# Patient Record
Sex: Female | Born: 1960 | Race: White | Hispanic: No | Smoking: Former smoker
Health system: Southern US, Community
[De-identification: ages and names within clinical notes are randomized; demographics above are authoritative.]

## PROBLEM LIST (undated history)

## (undated) DIAGNOSIS — K219 Gastro-esophageal reflux disease without esophagitis: Secondary | ICD-10-CM

## (undated) DIAGNOSIS — I10 Essential (primary) hypertension: Secondary | ICD-10-CM

## (undated) DIAGNOSIS — R112 Nausea with vomiting, unspecified: Secondary | ICD-10-CM

## (undated) DIAGNOSIS — Z9889 Other specified postprocedural states: Secondary | ICD-10-CM

## (undated) DIAGNOSIS — F419 Anxiety disorder, unspecified: Secondary | ICD-10-CM

## (undated) DIAGNOSIS — E039 Hypothyroidism, unspecified: Secondary | ICD-10-CM

## (undated) DIAGNOSIS — E785 Hyperlipidemia, unspecified: Secondary | ICD-10-CM

## (undated) HISTORY — DX: Anxiety disorder, unspecified: F41.9

## (undated) HISTORY — DX: Hyperlipidemia, unspecified: E78.5

## (undated) HISTORY — DX: Gastro-esophageal reflux disease without esophagitis: K21.9

## (undated) HISTORY — PX: COLONOSCOPY: SHX174

## (undated) HISTORY — PX: WISDOM TOOTH EXTRACTION: SHX21

## (undated) HISTORY — DX: Hypothyroidism, unspecified: E03.9

---

## 1999-04-18 ENCOUNTER — Other Ambulatory Visit: Admission: RE | Admit: 1999-04-18 | Discharge: 1999-04-18 | Payer: Self-pay | Admitting: Obstetrics and Gynecology

## 1999-11-01 ENCOUNTER — Encounter (INDEPENDENT_AMBULATORY_CARE_PROVIDER_SITE_OTHER): Payer: Self-pay

## 1999-11-01 ENCOUNTER — Inpatient Hospital Stay (HOSPITAL_COMMUNITY): Admission: AD | Admit: 1999-11-01 | Discharge: 1999-11-04 | Payer: Self-pay | Admitting: Obstetrics and Gynecology

## 1999-11-08 ENCOUNTER — Encounter: Admission: RE | Admit: 1999-11-08 | Discharge: 2000-02-06 | Payer: Self-pay | Admitting: Obstetrics and Gynecology

## 1999-12-07 ENCOUNTER — Other Ambulatory Visit: Admission: RE | Admit: 1999-12-07 | Discharge: 1999-12-07 | Payer: Self-pay | Admitting: Obstetrics and Gynecology

## 2001-04-11 ENCOUNTER — Other Ambulatory Visit: Admission: RE | Admit: 2001-04-11 | Discharge: 2001-04-11 | Payer: Self-pay | Admitting: Obstetrics and Gynecology

## 2002-03-04 ENCOUNTER — Inpatient Hospital Stay (HOSPITAL_COMMUNITY): Admission: AD | Admit: 2002-03-04 | Discharge: 2002-03-04 | Payer: Self-pay | Admitting: Obstetrics and Gynecology

## 2002-03-18 ENCOUNTER — Inpatient Hospital Stay (HOSPITAL_COMMUNITY): Admission: AD | Admit: 2002-03-18 | Discharge: 2002-03-21 | Payer: Self-pay | Admitting: *Deleted

## 2002-03-18 ENCOUNTER — Inpatient Hospital Stay (HOSPITAL_COMMUNITY): Admission: AD | Admit: 2002-03-18 | Discharge: 2002-03-18 | Payer: Self-pay | Admitting: Obstetrics & Gynecology

## 2002-03-18 ENCOUNTER — Encounter: Payer: Self-pay | Admitting: Obstetrics & Gynecology

## 2002-05-01 ENCOUNTER — Other Ambulatory Visit: Admission: RE | Admit: 2002-05-01 | Discharge: 2002-05-01 | Payer: Self-pay | Admitting: Obstetrics and Gynecology

## 2003-04-02 ENCOUNTER — Ambulatory Visit (HOSPITAL_COMMUNITY): Admission: RE | Admit: 2003-04-02 | Discharge: 2003-04-02 | Payer: Self-pay | Admitting: Gastroenterology

## 2003-04-20 ENCOUNTER — Encounter: Admission: RE | Admit: 2003-04-20 | Discharge: 2003-04-20 | Payer: Self-pay | Admitting: Gastroenterology

## 2003-04-20 ENCOUNTER — Encounter: Payer: Self-pay | Admitting: Gastroenterology

## 2003-05-13 ENCOUNTER — Other Ambulatory Visit: Admission: RE | Admit: 2003-05-13 | Discharge: 2003-05-13 | Payer: Self-pay | Admitting: Obstetrics and Gynecology

## 2005-11-15 ENCOUNTER — Ambulatory Visit (HOSPITAL_COMMUNITY): Admission: RE | Admit: 2005-11-15 | Discharge: 2005-11-15 | Payer: Self-pay | Admitting: Obstetrics and Gynecology

## 2007-05-05 ENCOUNTER — Emergency Department (HOSPITAL_COMMUNITY): Admission: EM | Admit: 2007-05-05 | Discharge: 2007-05-05 | Payer: Self-pay | Admitting: Emergency Medicine

## 2010-12-22 NOTE — Op Note (Signed)
[facility]  Patient:    Christine [name], Christine [name]                         MRN: [id number] Proc. Date: 11/01/99 Adm. Date:  [id number] Attending:  Lenoard Aden                           Operative Report  PREOPERATIVE DIAGNOSIS:       Persistent placenta previa.  POSTOPERATIVE DIAGNOSIS:      Persistent placenta previa.  OPERATION:                    Primary low transverse cesarean section.  SURGEON:                      Lenoard Aden, M.D.  ASSISTANT:                    Sung Amabile. Roslyn Smiling, M.D.  ANESTHESIA:                   Spinal by Raul Del, M.D.  ESTIMATED BLOOD LOSS:         1000 cc.  COMPLICATIONS:                None.  DRAINS:                       Foley.  COUNTS:                       Correct.  CONDITION:                    The patient to the recovery room in good condition.  DESCRIPTION OF PROCEDURE:     After being apprised of the risks of anesthesia, infection, bleeding, injury to abdominal organs with need for repair, possible eed for blood transfusion, inherent risks of HIV and hepatitis, the patient was brought to the operating room after consents were signed.  Prepped and draped in the usual sterile fashion.  After achieving adequate spinal anesthesia and being prepped nd draped in the usual sterile fashion the Foley catheter was placed.  A Pfannenstiel skin incision was made with the scalpel and carried down to the fascia which was nicked in the midline and opened transversely using Mayo scissors.  The rectus muscles were dissected sharply in the midline.  The peritoneum entered sharply.  Bladder blade placed.  The visceroperitoneum scored in a smile-like fashion. Uterine incision was made using a scalpel for bloody, but clear amniotic fluid or delivery from vertex position of a fullterm living female, Apgars 8 and 9. Pediatricians in attendance.  Placenta palpably is a wraparound anterior posterior placenta previa  which is removed without difficulty.  The uterus is exteriorized. Normal tubes and ovaries are noted.  Intrauterine cavity has some evidence of bleeding from the lower implantation site along the left back part of the uterus. These are sutured using interrupted mattress sutures of 0 chromic suture without difficulty.  Good hemostasis is achieved.  Uterine incision closed using 0 Monocryl in continuous running fashion.  Second imbricating layer placed.  Rectus muscles reapproximated in the midline using a 0 chromic suture.  The fascia closed using 0 Vicryl in continuous running fashion.  The skin closed using staples.  Dilute Marcaine solution placed.  The patient  tolerates the procedure well and was transferred to the recovery room in good condition. DD:  11/01/99 TD:  11/01/99 Job: [id number]

## 2010-12-22 NOTE — Discharge Summary (Signed)
[facility]  Patient:    Christine [name], Christine [name]                         MRN: [id number] Adm. Date:  [id number] Disc. Date: [id number] Attending:  Lenoard Aden                           Discharge Summary  HOSPITAL COURSE:              Patient underwent uncomplicated C-section for a posterior placenta previa at 38 weeks on November 01, 1999, uncomplicated C-section performed.  Tolerated regular diet well postop day #1.  Hemoglobin 9.1. Discharged to home day #3.  DISCHARGE MEDICATIONS:        Tylox, #20, prenatal vitamins and iron given.  FOLLOWUP:                     Follow up in the office in four to six weeks. DD:  11/16/99 TD:  11/17/99 Job: [id number]

## 2010-12-22 NOTE — Op Note (Signed)
NAME:  Christine [name], Christine [name]                            ACCOUNT NO.:  [id number]   MEDICAL RECORD NO.:  [id number]                   PATIENT TYPE:  AMB   LOCATION:  ENDO                                 FACILITY:  [facility]   PHYSICIAN:  Anselmo Rod, M.D.               DATE OF BIRTH:  1960/12/17   DATE OF PROCEDURE:  04/02/2003  DATE OF DISCHARGE:                                 OPERATIVE REPORT   PROCEDURE PERFORMED:  Screening colonoscopy.   ENDOSCOPIST:  Anselmo Rod, M.D.   INSTRUMENT USED:  Olympus video colonoscope.   INDICATION FOR PROCEDURE:  A 50 year old white female with a history of  change in bowel habits, right lower quadrant pain, and a sister with high-  grade dysplasia, impression adenocarcinoma on a rectal mass.  Her sister is  60 years of age.  Rule out colonic polyps.   PREPROCEDURE PREPARATION:  Informed consent was procured from the patient.  The patient was fasted for eight hours prior to the procedure and prepped  with a bottle of magnesium citrate and a gallon of GoLYTELY the night prior  to the procedure.  The patient was also advised to stop breast-feeding for  24 hours before and after the procedure.   PREPROCEDURE PHYSICAL:  VITAL SIGNS:  The patient had stable vital signs.  NECK:  Supple.  CHEST:  Clear to auscultation.  S1, S2 regular.  ABDOMEN:  Soft with normal bowel sounds.   DESCRIPTION OF PROCEDURE:  The patient was placed in the left lateral  decubitus position and sedated with 100 mg of Demerol and 10 mg of Versed  intravenously.  Once the patient was adequately sedate and maintained on low-  flow oxygen and continuous cardiac monitoring, the Olympus video colonoscope  was advanced from the rectum to the cecum with slight difficulty.  The  patient had a tortuous colon.  No masses, polyps, erosions, or ulcerations  or diverticula were seen.  The appendiceal orifice and the ileocecal valve  were clearly visualized and photographed.  The terminal  ileum appeared  normal.  Small internal hemorrhoids were seen on retroflexion in the rectum.   IMPRESSION:  Normal colonoscopy up to the terminal ileum except for small  internal hemorrhoids.                RECOMMENDATIONS:  1. Repeat CRC screening is recommended in the next five years unless the     patient develops any abnormal symptoms in the interim.  2. A high-fiber diet with liberal fluid intake has been advocated.  3. Outpatient follow-up in the next two weeks or earlier if need be.  4. Resume lactation after 24 hours.  Anselmo Rod, M.D.    JNM/MEDQ  D:  04/02/2003  T:  04/03/2003  Job:  [id number]   cc:   Maxie Better, M.D.  [address]  [address]  [place]  [place] [address]  Fax: [phone]

## 2010-12-22 NOTE — H&P (Signed)
NAME:  Christine [name], Christine [name]                            ACCOUNT NO.:  [id number]   MEDICAL RECORD NO.:  [id number]                   PATIENT TYPE:  INP   LOCATION:  9172                                 FACILITY:  [facility]   PHYSICIAN:  Lenoard Aden, M.D.             DATE OF BIRTH:  11/19/1960   DATE OF ADMISSION:  03/18/2002  DATE OF DISCHARGE:                                HISTORY & PHYSICAL   CHIEF COMPLAINT:  Labor.   HISTORY OF PRESENT ILLNESS:  The patient is a 50 year old Caucasian female  G3, P1, EDD of March 09, 2002 at 41+ weeks for attempted VBAC in active  labor.   ALLERGIES:  CODEINE.   MEDICATIONS:  Prenatal vitamins.   PAST OBSTETRIC HISTORY:  A 6 pound 14 ounce female delivered by cesarean  section in 2001 due to persistent placenta previa, history of complete AB  without D&E in August 2002.   PAST MEDICAL HISTORY:  No other medical or surgical hospitalizations.   FAMILY HISTORY:  Manic depression, heart problems, adult-onset diabetes,  urinary tract infections.   PRENATAL LABORATORY DATA:  Blood type A+.  Rh antibody negative.  Rubella  immune.  Hepatitis B surface antigen negative.  HIV nonreactive.   Pregnancy complicated by domestic to include sudden death of her husband  during the mid pregnancy course from myocardial infarction.  The patient has  coped well with this trauma and appropriate grieving has been noted.  Appropriate support mechanisms are noted.   PHYSICAL EXAMINATION:  GENERAL:  She is a well-developed, well-nourished  white female in no apparent distress.  HEENT:  Normal.  LUNGS:  Clear.  HEART:  Regular rate and rhythm.  ABDOMEN:  Soft, gravid, nontender.  Estimated fetal weight on ultrasound is  8-5.9 pounds.  PELVIC:  Cervix upon admission is 2-3 cm, 70%, vertex, -1/-2.  Spontaneous  rupture of membranes reveals thick meconium.  EXTREMITIES:  No cords.  NEUROLOGIC:  Nonfocal.   IMPRESSION:  1. A 41-week intrauterine pregnancy.  2.  Previous cesarean section.  The patient desires vaginal birth after     cesarean.  Risks, benefits of proceeding with attempted vaginal birth     after cesarean, risks of uterine rupture, possible need for Pitocin     augmentation with increased uterine scar dehiscence noted.  3. Meconium rupture of membranes.   PLAN:  Proceed with cautious continued induction.  IUPC with amnioinfusion  to be placed.  The patient acknowledges these limitations and will proceed  with cautious attempt at trial of labor.                                               Lenoard Aden, M.D.    RJT/MEDQ  D:  03/19/2002  T:  03/19/2002  Job:  [id number]

## 2010-12-22 NOTE — H&P (Signed)
   NAME:  Christine [name], Christine [name]                            ACCOUNT NO.:  [id number]   MEDICAL RECORD NO.:  [id number]                   PATIENT TYPE:  MAT   LOCATION:  MATC                                 FACILITY:  [facility]   PHYSICIAN:  Lenoard Aden, M.D.             DATE OF BIRTH:  11-Feb-1961   DATE OF ADMISSION:  03/04/2002  DATE OF DISCHARGE:                                HISTORY & PHYSICAL   CHIEF COMPLAINT:  Rule out labor.   HISTORY OF PRESENT ILLNESS:  The patient is a 50 year old white female, G3,  P1, EDD of March 08, 2002, at 39+ weeks, who presents with active  contractions every five minutes.   PAST MEDICAL HISTORY:  1. History of SAB in 2002.  2. History of primary cesarean section for a 6 pound 14 ounce female in 2001     for placenta previa.   The patient has no other medical or surgical hospitalizations.   MEDICATIONS:  Prenatal vitamins.   ALLERGIES:  CODEINE.   FAMILY HISTORY:  UTI, stroke, throat cancer, and heart disease.   PRENATAL LABORATORY DATA:  Blood type A+.  Rubella immune.  Hepatitis B  surface antigen negative.  HIV nonreactive.   PHYSICAL EXAMINATION:  GENERAL APPEARANCE:  She is a well-developed, well-  nourished, white female in no apparent distress.  HEENT:  Normal.  LUNGS:  Clear.  HEART:  Regular rhythm.  ABDOMEN:  Soft, gravid, and nontender.  PELVIC:  The cervix is less than fingertip, about 70-80% effaced, and vertex  in -1.  EXTREMITIES:  There are no cords.  NEUROLOGIC:  Nonfocal.   IMPRESSION:  1. Term intrauterine pregnancy.  2. Previous cesarean section.  3. For vaginal birth after cesarean section.  4. Prodromal labor pattern with reactive nonstress test and irregular     contractions.    PLAN:  1. Discharge home.  2. Labor warnings given.  3. Follow up in the office within 24 hours.                                               Lenoard Aden, M.D.    RJT/MEDQ  D:  03/04/2002  T:  03/06/2002  Job:  [id number]

## 2011-05-17 LAB — CBC
HCT: 38.3
Hemoglobin: 13
MCHC: 34
Platelets: 233
RBC: 4.33
RDW: 12.6
WBC: 6.7

## 2011-05-17 LAB — COMPREHENSIVE METABOLIC PANEL
ALT: 22
AST: 19
Albumin: 3.8
Alkaline Phosphatase: 40
BUN: 18
Calcium: 9.3
Chloride: 105
Creatinine, Ser: 0.77
GFR calc non Af Amer: >60
Glucose, Bld: 97
Potassium: 4.3
Sodium: 135
Total Bilirubin: 0.8
Total Protein: 7

## 2011-05-17 LAB — DIFFERENTIAL
Eosinophils Relative: 3
Lymphocytes Relative: 29
Lymphs Abs: 2
Monocytes Absolute: 0.4
Monocytes Relative: 6
Neutro Abs: 4.1
Neutrophils Relative %: 61

## 2011-05-17 LAB — URINALYSIS, ROUTINE W REFLEX MICROSCOPIC
Bilirubin Urine: NEGATIVE
Hgb urine dipstick: NEGATIVE
Ketones, ur: NEGATIVE
Nitrite: NEGATIVE
Protein, ur: NEGATIVE
Specific Gravity, Urine: 1.029

## 2011-05-17 LAB — POCT CARDIAC MARKERS
CKMB, poc: <1 — ABNORMAL LOW
Myoglobin, poc: 84.8
Troponin i, poc: <0.05

## 2011-05-17 LAB — POCT PREGNANCY, URINE
Operator id: 285841
Preg Test, Ur: NEGATIVE

## 2011-08-22 ENCOUNTER — Ambulatory Visit (INDEPENDENT_AMBULATORY_CARE_PROVIDER_SITE_OTHER): Payer: Self-pay | Admitting: Internal Medicine

## 2011-08-22 DIAGNOSIS — Z23 Encounter for immunization: Secondary | ICD-10-CM

## 2011-08-22 DIAGNOSIS — F411 Generalized anxiety disorder: Secondary | ICD-10-CM

## 2011-08-22 DIAGNOSIS — G2581 Restless legs syndrome: Secondary | ICD-10-CM

## 2011-08-22 DIAGNOSIS — E039 Hypothyroidism, unspecified: Secondary | ICD-10-CM

## 2011-12-26 ENCOUNTER — Other Ambulatory Visit: Payer: Self-pay | Admitting: Physician Assistant

## 2012-02-28 ENCOUNTER — Other Ambulatory Visit: Payer: Self-pay | Admitting: Physician Assistant

## 2012-02-29 NOTE — Telephone Encounter (Signed)
Dr. Merla Riches- Your OV 08/2011 states to F/U in 1 year, but then you increased the Synthroid dose with the results of the labs.  Do you want to refill it for the rest of the year, or do you want to see her back for a TSH?  Her paper chart is in your box.

## 2012-03-04 ENCOUNTER — Telehealth: Payer: Self-pay

## 2012-03-04 MED ORDER — LEVOTHYROXINE SODIUM 125 MCG PO TABS: 125.0000 ug | ORAL_TABLET | Freq: Every day | ORAL | Status: DC

## 2012-03-04 NOTE — Telephone Encounter (Signed)
Have pulled chart see message, pt last here in January and Dr Merla Riches note indicates dose may need change, need to know best way to handle this, pt has not been taking, wants to take now. ? Does she need 112 mcg or 125 mcg? pls advise.

## 2012-03-04 NOTE — Telephone Encounter (Signed)
Patient notified plan.

## 2012-03-04 NOTE — Telephone Encounter (Signed)
In January her dose was increased to 125 mcg, so she should restart that dose and then follow up in 6 weeks to recheck TSH.

## 2012-03-04 NOTE — Telephone Encounter (Signed)
Dr. Merla Riches,   Patient has not taken the synthroid for three weeks and is inconsistent in taking the medication.  She is requesting a refill so she can take it and you monitor her progress.  She is contacting the pharmacy to send it over electronically    Call back number is [phone]

## 2012-06-05 ENCOUNTER — Telehealth: Payer: Self-pay

## 2012-06-05 MED ORDER — LEVOTHYROXINE SODIUM 125 MCG PO TABS: 125.0000 ug | ORAL_TABLET | Freq: Every day | ORAL | Status: DC

## 2012-06-05 NOTE — Telephone Encounter (Signed)
Christine [name] needs extension on her Synthroid generic. 125 mg. [phone] best number [facility] Patient set appt for Nov. 27

## 2012-06-05 NOTE — Telephone Encounter (Signed)
Done. Left message for her to advise.

## 2012-07-02 ENCOUNTER — Ambulatory Visit: Payer: Self-pay | Admitting: Internal Medicine

## 2012-07-02 ENCOUNTER — Encounter: Payer: Self-pay | Admitting: Internal Medicine

## 2012-07-02 VITALS — BP 116/84 | HR 84 | Temp 97.6°F | Resp 16 | Ht 66.5 in | Wt 200.0 lb

## 2012-07-02 DIAGNOSIS — F419 Anxiety disorder, unspecified: Secondary | ICD-10-CM | POA: Insufficient documentation

## 2012-07-02 DIAGNOSIS — E039 Hypothyroidism, unspecified: Secondary | ICD-10-CM

## 2012-07-02 DIAGNOSIS — Z6831 Body mass index (BMI) 31.0-31.9, adult: Secondary | ICD-10-CM | POA: Insufficient documentation

## 2012-07-02 DIAGNOSIS — F411 Generalized anxiety disorder: Secondary | ICD-10-CM

## 2012-07-02 LAB — T4, FREE: Free T4: 1.2 ng/dL (ref 0.80–1.80)

## 2012-07-02 LAB — TSH: TSH: 3.095 u[IU]/mL (ref 0.350–4.500)

## 2012-07-02 MED ORDER — PAROXETINE HCL 20 MG PO TABS: 20.0000 mg | ORAL_TABLET | ORAL | Status: DC

## 2012-07-02 NOTE — Progress Notes (Signed)
  Subjective:    Patient ID: Christine [name], female    DOB: 08-29-1960, 51 y.o.   MRN: [id number]  HPIF/U Patient Active Problem List  Diagnosis  . Hypothyroid  . Anxiety  . BMI 31.0-31.9,adult   doing very well other than the fatigue comes with being overworked and underpaid/having lots of responsibilities Husband died suddenly in the 2003/myocardial infarction She works from home/no insurance/2 kids 12 and 9/the older with autism  Dose thyroxine has varied but has been at 125 mcg over the last several months/no excessive weight gain/no edema  Paxil has controlled her panic attacks and her anxiety level no longer interferes with activity She is unable to do consistent exercise program Review of Systems    stable Objective:   Physical Exam   vital signs normal except weight 200 pounds No thyromegaly  Results for orders placed in visit on 07/02/12  T4, FREE      Component Value Range   Free T4 1.20  0.80 - 1.80 ng/dL  TSH      Component Value Range   TSH 3.095  0.350 - 4.500 uIU/mL       Assessment & Plan:   1. Hypothyroid  T4, Free, TSH  2. Anxiety  PARoxetine (PAXIL) 20 MG tablet  3. BMI 31.0-31.9,adult     Meds ordered this encounter  Medications  . DISCONTD: PARoxetine (PAXIL) 20 MG tablet    Sig: Take 20 mg by mouth every morning.  Marland Kitchen PARoxetine (PAXIL) 20 MG tablet    Sig: Take 1 tablet (20 mg total) by mouth every morning.    Dispense:  90 tablet    Refill:  3    -  levothyroxine 125 mcg #90 with 3 refills

## 2012-07-03 ENCOUNTER — Encounter: Payer: Self-pay | Admitting: Internal Medicine

## 2012-07-03 MED ORDER — LEVOTHYROXINE SODIUM 125 MCG PO TABS: 125.0000 ug | ORAL_TABLET | Freq: Every day | ORAL | Status: DC

## 2013-08-21 ENCOUNTER — Other Ambulatory Visit: Payer: Self-pay | Admitting: Internal Medicine

## 2013-08-31 ENCOUNTER — Other Ambulatory Visit: Payer: Self-pay | Admitting: Internal Medicine

## 2013-10-09 ENCOUNTER — Other Ambulatory Visit: Payer: Self-pay | Admitting: Physician Assistant

## 2013-10-12 ENCOUNTER — Other Ambulatory Visit: Payer: Self-pay | Admitting: Physician Assistant

## 2013-10-14 ENCOUNTER — Other Ambulatory Visit: Payer: Self-pay | Admitting: Physician Assistant

## 2013-10-15 NOTE — Telephone Encounter (Signed)
Pt has appt scheduled w you on 12/02/13, but hasn't been seen since 06/2012. Do you want to Rf until her appt?

## 2013-10-15 NOTE — Telephone Encounter (Signed)
VO from [facility] to refill medication.  Confirmed with pt that she will be here for her appt on April 26. Pt understands that she needs to come to the appt for further refills.

## 2013-11-19 ENCOUNTER — Other Ambulatory Visit: Payer: Self-pay | Admitting: Physician Assistant

## 2013-12-02 ENCOUNTER — Ambulatory Visit: Payer: Self-pay | Admitting: Internal Medicine

## 2013-12-30 ENCOUNTER — Ambulatory Visit: Payer: Self-pay | Admitting: Family Medicine

## 2013-12-30 ENCOUNTER — Encounter: Payer: Self-pay | Admitting: Family Medicine

## 2013-12-30 VITALS — BP 128/82 | HR 84 | Temp 97.9°F | Resp 17 | Ht 66.5 in | Wt 210.0 lb

## 2013-12-30 DIAGNOSIS — E785 Hyperlipidemia, unspecified: Secondary | ICD-10-CM

## 2013-12-30 DIAGNOSIS — Z803 Family history of malignant neoplasm of breast: Secondary | ICD-10-CM

## 2013-12-30 DIAGNOSIS — Z0000 Encounter for general adult medical examination without abnormal findings: Secondary | ICD-10-CM

## 2013-12-30 DIAGNOSIS — F410 Panic disorder [episodic paroxysmal anxiety] without agoraphobia: Secondary | ICD-10-CM

## 2013-12-30 DIAGNOSIS — L237 Allergic contact dermatitis due to plants, except food: Secondary | ICD-10-CM

## 2013-12-30 DIAGNOSIS — E039 Hypothyroidism, unspecified: Secondary | ICD-10-CM

## 2013-12-30 DIAGNOSIS — Z79899 Other long term (current) drug therapy: Secondary | ICD-10-CM

## 2013-12-30 LAB — GLUCOSE, POCT (MANUAL RESULT ENTRY): POC GLUCOSE: 88 mg/dL (ref 70–99)

## 2013-12-30 MED ORDER — PAROXETINE HCL 20 MG PO TABS: ORAL_TABLET | ORAL | Status: DC

## 2013-12-30 MED ORDER — LEVOTHYROXINE SODIUM 125 MCG PO TABS: ORAL_TABLET | ORAL | Status: DC

## 2013-12-30 MED ORDER — PREDNISONE 20 MG PO TABS: ORAL_TABLET | ORAL | Status: DC

## 2013-12-30 NOTE — Patient Instructions (Addendum)
Return for fasting labs in next week or two at the most. You should receive a call or letter about your lab results within a week to 10 days from time obtained.  Follow up with eye care provider  - [facility], Dr. Jerline Pain at [facility],  Or [facility].  Follow up with your dentist.  We will refer you for a mammogram.  Ivy dry or calamine to poison ivy, but if reash persists on face in morning - start prednisone as discussed. Return to the clinic or go to the nearest emergency room if any of your symptoms worsen or new symptoms occur.   Keeping You Healthy  Get These Tests  Blood Pressure- Have your blood pressure checked by your healthcare provider at least once a year.  Normal blood pressure is 120/80.  Weight- Have your body mass index (BMI) calculated to screen for obesity.  BMI is a measure of body fat based on height and weight.  You can calculate your own BMI at [employer].it  Cholesterol- Have your cholesterol checked every year.  Diabetes- Have your blood sugar checked every year if you have high blood pressure, high cholesterol, a family history of diabetes or if you are overweight.  Pap Smear- Have a pap smear every 1 to 3 years if you have been sexually active.  If you are older than 65 and recent pap smears have been normal you may not need additional pap smears.  In addition, if you have had a hysterectomy  For benign disease additional pap smears are not necessary.  Mammogram-Yearly mammograms are essential for early detection of breast cancer  Screening for Colon Cancer- Colonoscopy starting at age 67. Screening may begin sooner depending on your family history and other health conditions.  Follow up colonoscopy as directed by your Gastroenterologist.  Screening for Osteoporosis- Screening begins at age 36 with bone density scanning, sooner if you are at higher risk for developing Osteoporosis.  Get these medicines  Calcium with Vitamin D- Your body  requires 1200-1500 mg of Calcium a day and [phone] IU of Vitamin D a day.  You can only absorb 500 mg of Calcium at a time therefore Calcium must be taken in 2 or 3 separate doses throughout the day.  Hormones- Hormone therapy has been associated with increased risk for certain cancers and heart disease.  Talk to your healthcare provider about if you need relief from menopausal symptoms.  Aspirin- Ask your healthcare provider about taking Aspirin to prevent Heart Disease and Stroke.  Get these Immuniztions  Flu shot- Every fall  Pneumonia shot- Once after the age of 40; if you are younger ask your healthcare provider if you need a pneumonia shot.  Tetanus- Every ten years.  Zostavax- Once after the age of 41 to prevent shingles.  Take these steps  Don't smoke- Your healthcare provider can help you quit. For tips on how to quit, ask your healthcare provider or go to www.smokefree.gov or call 1-800 QUIT-NOW.  Be physically active- Exercise 5 days a week for a minimum of 30 minutes.  If you are not already physically active, start slow and gradually work up to 30 minutes of moderate physical activity.  Try walking, dancing, bike riding, swimming, etc.  Eat a healthy diet- Eat a variety of healthy foods such as fruits, vegetables, whole grains, low fat milk, low fat cheeses, yogurt, lean meats, chicken, fish, eggs, dried beans, tofu, etc.  For more information go to www.thenutritionsource.org  Dental visit-  Brush and floss teeth twice daily; visit your dentist twice a year.  Eye exam- Visit your Optometrist or Ophthalmologist yearly.  Drink alcohol in moderation- Limit alcohol intake to one drink or less a day.  Never drink and drive.  Depression- Your emotional health is as important as your physical health.  If you're feeling down or losing interest in things you normally enjoy, please talk to your healthcare provider.  Seat Belts- can save your life; always wear one  Smoke/Carbon  Monoxide detectors- These detectors need to be installed on the appropriate level of your home.  Replace batteries at least once a year.  Violence- If anyone is threatening or hurting you, please tell your healthcare provider.  Living Will/ Health care power of attorney- Discuss with your healthcare provider and family.

## 2013-12-30 NOTE — Progress Notes (Deleted)
Physical Exam  Nursing note and vitals reviewed. Constitutional: She is oriented to person, place, and time. She appears well-developed and well-nourished. No distress.  HENT:  Head: Normocephalic and atraumatic.  Mouth/Throat: No dental abscesses.  Dental decay right upper first molar, otherwise no other abscess or significant decay  Eyes: EOM are normal.  Neck: Neck supple.  Cardiovascular: Normal rate.   Pulmonary/Chest: Effort normal. No respiratory distress.  Musculoskeletal: Normal range of motion.  Neurological: She is alert and oriented to person, place, and time.  Skin: Skin is warm and dry. Rash noted. There is erythema.  Erythema patch 4x2 cm on right neck below ear, erythematous scattered papules on right cheek, 2 small patches of vesicles right forefinger and left wrist.  Psychiatric: She has a normal mood and affect. Her behavior is normal.

## 2013-12-30 NOTE — Progress Notes (Addendum)
Subjective:    Patient ID: Christine [name], female    DOB: February 03, 1961, 54 y.o.   MRN: [id number] This chart was scribed for Merri Ray, MD by Randa Evens, ED Scribe. This Patient was seen in room 13 and the patients care was started at 4:53 PM  HPI Christine [name] is a 53 y.o. female  Last seen November 2013 hypothyroidism States she is still currently on synthroid and recently have missed the last 4 days of her medication. She states that she is not fasting today. She states that she hasn't been off the medication "that much".  Used Paxil for panic attack in past She states that she has recently ran out of her prescription of Paxil. She state that some of her symptoms may be coming from running out of the medicine. She states that she has wanted to try and completely come off the prescription. She states that she wants to wait until she has more stability in her life.   Pt presents to urgent medical for physical. Last physical was November 2013. She states that she recently has cold symptoms, including a headache, dizziness, and nausea.   She states that she some dental problems. She states that she just needs to go to the dentist. She states that she has had some tooth pain. She states that the she thinks her teeth are just decaying.  She also states that she is having vision problems and need to see the eye doctor.   She states that she is currently living a sedentary lifestyle.  She states her last pap smear has been over a year ago. She also states she is not sure when her last mammogram took place. She states that she has a family history of breast cancer.  She states her last tetanus shot was in 2013.  She states that she has been taking over the counter Allergy medicines for her seasonal allergies.  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   She states that she may have poison ivy on the right side of her cheek, head, and neck. She states she may have gotten the poison ivy  from coming in contact with a dog. She denies any associated eye itching or any other related symptoms.     Patient Active Problem List   Diagnosis Date Noted  . Hypothyroid 07/02/2012  . Anxiety 07/02/2012  . BMI 31.0-31.9,adult 07/02/2012   No past medical history on file. No past surgical history on file. Prior to Admission medications   Medication Sig Start Date End Date Taking? Authorizing Provider  levothyroxine (SYNTHROID, LEVOTHROID) 125 MCG tablet TAKE 1 TABLET BY MOUTH EVERY DAY NEEDS O/V FOR MORE REFILLS   Yes Leandrew Koyanagi, MD  PARoxetine (PAXIL) 20 MG tablet TAKE 1 TABLET (20 MG TOTAL) BY MOUTH DAILY. PATIENT NEEDS OFFICE VISIT FOR ADDITIONAL REFILLS   Yes Mancel Bale, PA-C   Allergies  Allergen Reactions  . Codeine    History   Social History  . Marital Status: Widowed    Spouse Name: N/A    Number of Children: N/A  . Years of Education: N/A   Occupational History  . Not on file.   Social History Main Topics  . Smoking status: Never Smoker   . Smokeless tobacco: Not on file  . Alcohol Use: Not on file  . Drug Use: Not on file  . Sexual Activity: Not on file   Other Topics Concern  . Not on file  Social History Narrative  . No narrative on file   Review of Systems  HENT: Positive for dental problem.   Eyes: Positive for visual disturbance. Negative for itching.  Gastrointestinal: Positive for nausea.  Skin: Positive for rash.  Neurological: Positive for dizziness and headaches.    Objective:   Filed Vitals:   12/30/13 1452  BP: 128/82  Pulse: 84  Temp: 97.9 F (36.6 C)  TempSrc: Oral  Resp: 17  Height: 5' 6.5" (1.689 m)  Weight: 210 lb (95.255 kg)  SpO2: 99%   No exam data present   Physical Exam  Nursing note and vitals reviewed. Constitutional: She is oriented to person, place, and time. She appears well-developed and well-nourished. No distress.  HENT:  Head: Normocephalic and atraumatic.  Mouth/Throat: No dental  abscesses.  Dental decay right upper first molar, otherwise no other abscess or significant decay  Eyes: EOM are normal.  Neck: Neck supple.  Cardiovascular: Normal rate.   Pulmonary/Chest: Effort normal. No respiratory distress.  Musculoskeletal: Normal range of motion.  Neurological: She is alert and oriented to person, place, and time.  Skin: Skin is warm and dry. Rash noted. There is erythema.  Erythema patch 4x2 cm on right forehead, erythematous scattered papules on right cheek, 2 small patches of vesicles right forefinger and left wrist.  Psychiatric: She has a normal mood and affect. Her behavior is normal.   Results for orders placed in visit on 12/30/13  GLUCOSE, POCT (MANUAL RESULT ENTRY)      Result Value Ref Range   POC Glucose 88  70 - 99 mg/dl     Assessment & Plan:   Christine [name] is a 53 y.o. female Routine general medical examination at a health care facility - Plan: Comprehensive metabolic panel, CBC, Lipid panel, TSH  -anticipatory guidance given. H/o as below.   -refer for MMG  -she is to schedule dentist and optho.   -exercise discussed.   Poison ivy/contact derm, High risk medication use - Plan: POCT glucose (manual entry)  - early on face - if not improving with topical ivy dry/caladryl or spread on face - prednisone rx given to start tomorrow. SED.   Hypothyroidism - Plan: CBC, TSH  -refilled synthroid.   Hyperlipidemia - Plan: Comprehensive metabolic panel, CBC, Lipid panel  -repeat lipids pending.   Panic attacks/anxiety - prior controlled.  Some serotonin w/d sx's off paxil.  Restart today.   Family history of breast cancer - Plan: MM DIGITAL SCREENING BILATERAL scheduled.    Return for fasting labs in next week or two at the most. You should receive a call or letter about your lab results within a week to 10 days from time obtained.  Follow up with eye care provider  - [facility], Dr. Jerline Pain at [facility],  Or [facility].  Follow up  with your dentist.  We will refer you for a mammogram.  Ivy dry or calamine to poison ivy, but if reash persists on face in morning - start prednisone as discussed. Return to the clinic or go to the nearest emergency room if any of your symptoms worsen or new symptoms occur.   Keeping You Healthy  Get These Tests  Blood Pressure- Have your blood pressure checked by your healthcare provider at least once a year.  Normal blood pressure is 120/80.  Weight- Have your body mass index (BMI) calculated to screen for obesity.  BMI is a measure of body fat based on height and weight.  You can calculate your own BMI at [employer]  Cholesterol- Have your cholesterol checked every year.  Diabetes- Have your blood sugar checked every year if you have high blood pressure, high cholesterol, a family history of diabetes or if you are overweight.  Pap Smear- Have a pap smear every 1 to 3 years if you have been sexually active.  If you are older than 65 and recent pap smears have been normal you may not need additional pap smears.  In addition, if you have had a hysterectomy  For benign disease additional pap smears are not necessary.  Mammogram-Yearly mammograms are essential for early detection of breast cancer  Screening for Colon Cancer- Colonoscopy starting at age 67. Screening may begin sooner depending on your family history and other health conditions.  Follow up colonoscopy as directed by your Gastroenterologist.  Screening for Osteoporosis- Screening begins at age 56 with bone density scanning, sooner if you are at higher risk for developing Osteoporosis.  Get these medicines  Calcium with Vitamin D- Your body requires 1200-1500 mg of Calcium a day and [phone] IU of Vitamin D a day.  You can only absorb 500 mg of Calcium at a time therefore Calcium must be taken in 2 or 3 separate doses throughout the day.  Hormones- Hormone therapy has been associated with increased risk for  certain cancers and heart disease.  Talk to your healthcare provider about if you need relief from menopausal symptoms.  Aspirin- Ask your healthcare provider about taking Aspirin to prevent Heart Disease and Stroke.  Get these Immuniztions  Flu shot- Every fall  Pneumonia shot- Once after the age of 15; if you are younger ask your healthcare provider if you need a pneumonia shot.  Tetanus- Every ten years.  Zostavax- Once after the age of 101 to prevent shingles.  Take these steps  Don't smoke- Your healthcare provider can help you quit. For tips on how to quit, ask your healthcare provider or go to www.smokefree.gov or call 1-800 QUIT-NOW.  Be physically active- Exercise 5 days a week for a minimum of 30 minutes.  If you are not already physically active, start slow and gradually work up to 30 minutes of moderate physical activity.  Try walking, dancing, bike riding, swimming, etc.  Eat a healthy diet- Eat a variety of healthy foods such as fruits, vegetables, whole grains, low fat milk, low fat cheeses, yogurt, lean meats, chicken, fish, eggs, dried beans, tofu, etc.  For more information go to www.thenutritionsource.org  Dental visit- Brush and floss teeth twice daily; visit your dentist twice a year.  Eye exam- Visit your Optometrist or Ophthalmologist yearly.  Drink alcohol in moderation- Limit alcohol intake to one drink or less a day.  Never drink and drive.  Depression- Your emotional health is as important as your physical health.  If you're feeling down or losing interest in things you normally enjoy, please talk to your healthcare provider.  Seat Belts- can save your life; always wear one  Smoke/Carbon Monoxide detectors- These detectors need to be installed on the appropriate level of your home.  Replace batteries at least once a year.  Violence- If anyone is threatening or hurting you, please tell your healthcare provider.  Living Will/ Health care power of attorney-  Discuss with your healthcare provider and family.      I personally performed the services described in this documentation, which was scribed in my presence. The recorded information has been reviewed and considered, and addended by  me as needed.

## 2014-01-01 NOTE — Progress Notes (Signed)
Physical Exam  Nursing note and vitals reviewed. Constitutional: She is oriented to person, place, and time. She appears well-developed and well-nourished. No distress.  HENT:  Head: Normocephalic and atraumatic.  Mouth/Throat: No dental abscesses.  Dental decay right upper first molar, otherwise no other abscess or significant decay  Eyes: EOM are normal.  Neck: Neck supple.  Cardiovascular: Normal rate.   Pulmonary/Chest: Effort normal. No respiratory distress.  Musculoskeletal: Normal range of motion.  Neurological: She is alert and oriented to person, place, and time.  Skin: Skin is warm and dry. Rash noted. There is erythema.  Erythema patch 4x2 cm on right neck below ear, erythematous scattered papules on right cheek, 2 small patches of vesicles right forefinger and left wrist.  Psychiatric: She has a normal mood and affect. Her behavior is normal.   This encounter was created in error - please disregard.

## 2014-07-12 ENCOUNTER — Other Ambulatory Visit: Payer: Self-pay

## 2014-07-12 ENCOUNTER — Telehealth: Payer: Self-pay | Admitting: Radiology

## 2014-07-12 DIAGNOSIS — E039 Hypothyroidism, unspecified: Secondary | ICD-10-CM

## 2014-07-12 MED ORDER — LEVOTHYROXINE SODIUM 125 MCG PO TABS: ORAL_TABLET | ORAL | Status: DC

## 2014-07-12 NOTE — Telephone Encounter (Signed)
Patient came by, she is requesting refill on synthroid, she has ran out. She was to come in for lab only in May, but failed to do this, stating she was not aware she was to have labs. She indicates she has no insurance and she can not afford labs or office visit. The last TSH on file was over 2 yrs ago, explained to patient not appropriate to renew meds without labs, she would like for you to make an exception for her.

## 2014-07-12 NOTE — Telephone Encounter (Signed)
We did discuss need for return for labs and this was noted on her patient instructions at May visit. I can refill her synthroid for 30 days so she doe not run out prior to labs obtained within those 30 days, but would need to have at least the TSH drawn prior to refills. If cost prohibitive at our office, can have this testing elsewhere if needed, but I need to see a TSH level in order to safely prescribe her meds (as I refilled them in good faith at May visit with intention of her returning soon after for fasting bloodwork to make sure it was correct dosing at that time).

## 2014-07-13 MED ORDER — LEVOTHYROXINE SODIUM 125 MCG PO TABS: ORAL_TABLET | ORAL | Status: DC

## 2014-07-13 NOTE — Telephone Encounter (Signed)
Called to advise , patient indicates she is a patient of Dr Laney Pastor, and has made appt to see him, last visit with Dr Laney Pastor was 2013. A 1 month Rx sent in (per Dr Carlota Raspberry), note to Dr Craig Guess, patient non compliant with meds/ labs, has stated she will see you on 08/04/14

## 2014-07-15 ENCOUNTER — Telehealth: Payer: Self-pay

## 2014-07-15 DIAGNOSIS — F410 Panic disorder [episodic paroxysmal anxiety] without agoraphobia: Secondary | ICD-10-CM

## 2014-07-15 MED ORDER — PAROXETINE HCL 20 MG PO TABS: ORAL_TABLET | ORAL | Status: DC

## 2014-07-15 NOTE — Telephone Encounter (Signed)
Sent #30 per protocol. Pt advised.

## 2014-07-15 NOTE — Telephone Encounter (Signed)
Patient is requesting that we also refill a 30 day extension on her paxil also. She says she received the one for synthroid. She is hoping to have this handled by today she says she leaves out of town in the morning. Patient says she has an appointment with Dr. Laney Pastor for 08/04/14. She says she has also seen Dr. Carlota Raspberry and if he is able to do this request also. Please advise.   Best: [phone]

## 2014-08-04 ENCOUNTER — Ambulatory Visit: Payer: Self-pay | Admitting: Internal Medicine

## 2014-08-16 ENCOUNTER — Ambulatory Visit (INDEPENDENT_AMBULATORY_CARE_PROVIDER_SITE_OTHER): Payer: Self-pay | Admitting: Family Medicine

## 2014-08-16 ENCOUNTER — Other Ambulatory Visit: Payer: Self-pay | Admitting: Family Medicine

## 2014-08-16 VITALS — BP 160/90 | HR 93 | Temp 98.2°F | Resp 16 | Ht 68.0 in | Wt 222.4 lb

## 2014-08-16 DIAGNOSIS — IMO0001 Reserved for inherently not codable concepts without codable children: Secondary | ICD-10-CM

## 2014-08-16 DIAGNOSIS — F410 Panic disorder [episodic paroxysmal anxiety] without agoraphobia: Secondary | ICD-10-CM

## 2014-08-16 DIAGNOSIS — R030 Elevated blood-pressure reading, without diagnosis of hypertension: Secondary | ICD-10-CM

## 2014-08-16 DIAGNOSIS — R0789 Other chest pain: Secondary | ICD-10-CM

## 2014-08-16 DIAGNOSIS — E039 Hypothyroidism, unspecified: Secondary | ICD-10-CM

## 2014-08-16 DIAGNOSIS — R635 Abnormal weight gain: Secondary | ICD-10-CM

## 2014-08-16 LAB — POCT CBC
Granulocyte percent: 59.7 %G (ref 37–80)
HCT, POC: 36 % — AB (ref 37.7–47.9)
Hemoglobin: 11.6 g/dL — AB (ref 12.2–16.2)
LYMPH, POC: 2.7 (ref 0.6–3.4)
MCH, POC: 28.7 pg (ref 27–31.2)
MCHC: 32.1 g/dL (ref 31.8–35.4)
MCV: 89.5 fL (ref 80–97)
MID (cbc): 0.6 (ref 0–0.9)
MPV: 7 fL (ref 0–99.8)
POC Granulocyte: 4.9 (ref 2–6.9)
POC LYMPH PERCENT: 33 %L (ref 10–50)
POC MID %: 7.3 %M (ref 0–12)
Platelet Count, POC: 288 10*3/uL (ref 142–424)
RBC: 4.03 M/uL — AB (ref 4.04–5.48)
RDW, POC: 15.7 %
WBC: 8.2 10*3/uL (ref 4.6–10.2)

## 2014-08-16 LAB — POCT GLYCOSYLATED HEMOGLOBIN (HGB A1C): Hemoglobin A1C: 5.4

## 2014-08-16 MED ORDER — PAROXETINE HCL 20 MG PO TABS: ORAL_TABLET | ORAL | Status: DC

## 2014-08-16 MED ORDER — LEVOTHYROXINE SODIUM 125 MCG PO TABS: ORAL_TABLET | ORAL | Status: DC

## 2014-08-16 NOTE — Patient Instructions (Signed)
Continue current medication  If you do not hear from me regarding your laboratory reports over the next week please contact me.  Get regular exercise and try to eat less in order to lose some weight. Take a daily walk, eat less salt.  Your blood pressure is a little high today, but will not yet label you as hypertensive. Monitor your blood pressures at a pharmacy or somewhere with a desire of seeing them running less than 140 over less than 90. If you continue to run elevated readings days return for recheck.  Return in July for a recheck with Dr. Carlota Raspberry or one of the others I was in the practice, sooner if problems arise.

## 2014-08-16 NOTE — Progress Notes (Signed)
Subjective: 54 year old lady who is here to get some labs done so she can get her medicines refilled. She saw Dr. Nyoka Cowden last summer. Was supposed to return for some blood work done and she did not. They would not refill her medications because she never got her labs done. She is here today for that, nonfasting in the afternoon. 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 Her husband died suddenly some years back at a young age, cause undetermined. She has some stress and anxiety in her life. Has dedicated her life to taking care of her children. 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 She is on the verge of being out of her medication and needs it. Review of systems include occasional episode of near panic. She does occasionally get some chest tightness sensation, nonspecific. She does not smoke. She does have some heartburn. No other GI or GU symptoms. She is not involved, staying single since her husband's death.  Objective: Pleasant lady in no major distress today. Alert and oriented. Neck supple without nodes or thyromegaly. Chest clear. Heart regular without murmurs. No ankle edema.  Assessment: Hypothyroidism Panic disorder Stress Nonspecific chest pain  Plan: EKG and labs as ordered. Continue her current medications. We'll let her know the results of the labs when I get them back in a few days.  EKG is normal  Patient is to go to the ER if she has worse pain at anytime, was instructed not to ignore them.  Laboratory results will be sent to her.

## 2014-08-17 LAB — COMPREHENSIVE METABOLIC PANEL
ALBUMIN: 3.9 g/dL (ref 3.5–5.2)
ALT: 30 U/L (ref 0–35)
AST: 22 U/L (ref 0–37)
Alkaline Phosphatase: 46 U/L (ref 39–117)
BUN: 22 mg/dL (ref 6–23)
CALCIUM: 9.1 mg/dL (ref 8.4–10.5)
CO2: 26 mEq/L (ref 19–32)
CREATININE: 0.74 mg/dL (ref 0.50–1.10)
Chloride: 105 mEq/L (ref 96–112)
Glucose, Bld: 93 mg/dL (ref 70–99)
Potassium: 3.9 mEq/L (ref 3.5–5.3)
Sodium: 138 mEq/L (ref 135–145)
Total Bilirubin: 0.3 mg/dL (ref 0.2–1.2)
Total Protein: 6.7 g/dL (ref 6.0–8.3)

## 2014-08-17 LAB — LIPID PANEL
Cholesterol: 248 mg/dL — ABNORMAL HIGH (ref 0–200)
HDL: 64 mg/dL (ref >39)
LDL Cholesterol: 155 mg/dL — ABNORMAL HIGH (ref 0–99)
Total CHOL/HDL Ratio: 3.9 Ratio
Triglycerides: 145 mg/dL (ref <150)
VLDL: 29 mg/dL (ref 0–40)

## 2014-08-17 LAB — TSH: TSH: 14.014 u[IU]/mL — ABNORMAL HIGH (ref 0.350–4.500)

## 2014-08-17 LAB — FERRITIN: Ferritin: 14 ng/mL (ref 10–291)

## 2014-08-20 ENCOUNTER — Other Ambulatory Visit: Payer: Self-pay | Admitting: *Deleted

## 2014-08-20 MED ORDER — LEVOTHYROXINE SODIUM 150 MCG PO TABS: 150.0000 ug | ORAL_TABLET | Freq: Every day | ORAL | Status: DC

## 2015-01-06 ENCOUNTER — Other Ambulatory Visit: Payer: Self-pay | Admitting: Family Medicine

## 2015-03-26 ENCOUNTER — Other Ambulatory Visit: Payer: Self-pay | Admitting: Family Medicine

## 2015-05-04 ENCOUNTER — Telehealth: Payer: Self-pay

## 2015-05-04 MED ORDER — LEVOTHYROXINE SODIUM 150 MCG PO TABS
ORAL_TABLET | ORAL | Status: DC
Start: 2015-05-04 — End: 2015-06-29

## 2015-05-04 NOTE — Telephone Encounter (Signed)
Rx sent for 30 day supply. Left voicemail letting pt know.

## 2015-05-04 NOTE — Telephone Encounter (Signed)
Pt would like to come in on Monday 10/4  to see Dr. Laney Pastor for a med refill, but until then she would like 4 pills of her levothyroxine (SYNTHROID, LEVOTHROID) 150 MCG tablet [[id number]] medication to tied her over. I explained to her she needs a OV before she can get a refill. Please advise at [phone]

## 2015-06-13 ENCOUNTER — Telehealth: Payer: Self-pay

## 2015-06-13 DIAGNOSIS — F410 Panic disorder [episodic paroxysmal anxiety] without agoraphobia: Secondary | ICD-10-CM

## 2015-06-13 NOTE — Telephone Encounter (Signed)
Please advise 

## 2015-06-13 NOTE — Telephone Encounter (Signed)
Pt states she knows that she is over due for a visit but finances are not where she can come in but she needs a refill on her paxcil at least til December     [phone]

## 2015-06-13 NOTE — Telephone Encounter (Signed)
Please refill through January and advise that she RTC in early January.  I will cosign. Philis Fendt, MS, PA-C   4:23 PM, 06/13/2015

## 2015-06-14 MED ORDER — PAROXETINE HCL 20 MG PO TABS: ORAL_TABLET | ORAL | Status: DC

## 2015-06-14 NOTE — Telephone Encounter (Signed)
Rx sent.  Pt notified. 

## 2015-06-29 ENCOUNTER — Other Ambulatory Visit: Payer: Self-pay | Admitting: Family Medicine

## 2015-08-23 ENCOUNTER — Other Ambulatory Visit: Payer: Self-pay | Admitting: Family Medicine

## 2015-09-04 ENCOUNTER — Ambulatory Visit (INDEPENDENT_AMBULATORY_CARE_PROVIDER_SITE_OTHER): Payer: Self-pay | Admitting: Internal Medicine

## 2015-09-04 VITALS — BP 170/80 | HR 110 | Temp 98.1°F | Resp 16 | Ht 66.5 in | Wt 222.0 lb

## 2015-09-04 DIAGNOSIS — Z6831 Body mass index (BMI) 31.0-31.9, adult: Secondary | ICD-10-CM

## 2015-09-04 DIAGNOSIS — I10 Essential (primary) hypertension: Secondary | ICD-10-CM

## 2015-09-04 DIAGNOSIS — E039 Hypothyroidism, unspecified: Secondary | ICD-10-CM

## 2015-09-04 DIAGNOSIS — F410 Panic disorder [episodic paroxysmal anxiety] without agoraphobia: Secondary | ICD-10-CM

## 2015-09-04 MED ORDER — HYDROCHLOROTHIAZIDE 12.5 MG PO CAPS: 12.5000 mg | ORAL_CAPSULE | Freq: Every day | ORAL | Status: DC

## 2015-09-04 MED ORDER — PAROXETINE HCL 20 MG PO TABS: ORAL_TABLET | ORAL | Status: DC

## 2015-09-04 MED ORDER — LEVOTHYROXINE SODIUM 150 MCG PO TABS: ORAL_TABLET | ORAL | Status: DC

## 2015-09-04 NOTE — Progress Notes (Signed)
Subjective:    Patient ID: Christine [name], female    DOB: 12/27/1960, 55 y.o.   MRN: [id number] By signing my name below, I, Judithe Modest, attest that this documentation has been prepared under the direction and in the presence of Tami Lin, MD. Electronically Signed: Judithe Modest, ER Scribe. 09/04/2015. 3:18 PM.  Chief Complaint  Patient presents with  . Medication Refill    ran out of thyroid meds     HPI HPI Comments: Christine [name] is a 54 y.o. female who presents to [facility] for a medication refill. She has been out of her synthroid medication for the last six weeks. Her BP has been up for the last month around 170/80.  She has never been diagnosed with hypertension.  She has gradually gained weight over the last several years. She denies swelling in extremities or SOB with exertion.  Denies family history of hypertension   Patient Active Problem List   Diagnosis Date Noted  . Hypothyroid 07/02/2012  . Anxiety--- Is doing well on Paxil 07/02/2012  . BMI 31.0-31.9,adult 07/02/2012    Allergies  Allergen Reactions  . Codeine      Review of Systems  Constitutional: Negative for fever, chills, activity change and fatigue.  Respiratory: Negative for shortness of breath.   Cardiovascular: Negative for chest pain, palpitations and leg swelling.  Gastrointestinal: Negative for abdominal pain.  Genitourinary: Negative for difficulty urinating.  Musculoskeletal: Positive for back pain.  Neurological: Negative for tremors, light-headedness and headaches.  Psychiatric/Behavioral: Negative for behavioral problems and sleep disturbance.      Objective:  BP 170/80 mmHg  Pulse 110  Temp(Src) 98.1 F (36.7 C)  Resp 16  Ht 5' 6.5" (1.689 m)  Wt 222 lb (100.699 kg)  BMI 35.30 kg/m2  SpO2 98%  LMP 08/21/2015 (Approximate)  Physical Exam  Constitutional: She is oriented to person, place, and time. She appears well-developed and well-nourished. No distress.  Overweight    HENT:  Head: Normocephalic and atraumatic.  Eyes: Conjunctivae and EOM are normal. Pupils are equal, round, and reactive to light.  Neck: Neck supple. No thyromegaly present.  Cardiovascular: Normal rate, regular rhythm, normal heart sounds and intact distal pulses.  Exam reveals no gallop and no friction rub.   No murmur heard. No carotid bruits. No peripheral edema.   Pulmonary/Chest: Effort normal. No respiratory distress.  Musculoskeletal: Normal range of motion. She exhibits no edema.  Tight trapezii bilaterally.  Lymphadenopathy:    She has no cervical adenopathy.  Neurological: She is alert and oriented to person, place, and time. Coordination normal.  Skin: Skin is warm and dry. She is not diaphoretic.  Psychiatric: She has a normal mood and affect. Her behavior is normal.  Nursing note and vitals reviewed.  Wt Readings from Last 3 Encounters:  09/04/15 222 lb (100.699 kg)  08/16/14 222 lb 6.4 oz (100.88 kg)  12/30/13 210 lb (95.255 kg)       Assessment & Plan:  Hypothyroidism, unspecified hypothyroidism type---restart meds at dose she was incr to 08/2014  Panic attacks - Plan: PARoxetine (PAXIL) 20 MG tablet  Essential hypertension--start meds  BMI 31.0-31.9,adult----stressed losing/diet/exer  Trapez spasm 2 to work posture--exercises given  Last labs 1/16 ok--has been off thyr meds 6-8 weeks so will defer labs today Meds ordered this encounter  Medications  . hydrochlorothiazide (MICROZIDE) 12.5 MG capsule    Sig: Take 1 capsule (12.5 mg total) by mouth daily.    Dispense:  90 capsule  Refill:  1  . levothyroxine (SYNTHROID, LEVOTHROID) 150 MCG tablet    Sig: TAKE 1 TABLET (150 MCG TOTAL) BY MOUTH DAILY    Dispense:  90 tablet    Refill:  1  . PARoxetine (PAXIL) 20 MG tablet    Sig: TAKE 1 TABLET (20 MG TOTAL) BY MOUTH DAILY.    Dispense:  90 tablet    Refill:  1  labs in 3 mos  I have completed the patient encounter in its entirety as documented by  the scribe, with editing by me where necessary.  P. Laney Pastor, M.D.

## 2015-09-05 DIAGNOSIS — I10 Essential (primary) hypertension: Secondary | ICD-10-CM | POA: Insufficient documentation

## 2015-09-24 ENCOUNTER — Other Ambulatory Visit: Payer: Self-pay | Admitting: Physician Assistant

## 2015-12-13 ENCOUNTER — Ambulatory Visit (INDEPENDENT_AMBULATORY_CARE_PROVIDER_SITE_OTHER): Payer: BLUE CROSS/BLUE SHIELD | Admitting: Internal Medicine

## 2015-12-13 VITALS — BP 126/80 | HR 82 | Temp 97.7°F | Resp 18 | Ht 66.5 in | Wt 226.6 lb

## 2015-12-13 DIAGNOSIS — F410 Panic disorder [episodic paroxysmal anxiety] without agoraphobia: Secondary | ICD-10-CM | POA: Diagnosis not present

## 2015-12-13 DIAGNOSIS — R0683 Snoring: Secondary | ICD-10-CM | POA: Diagnosis not present

## 2015-12-13 DIAGNOSIS — E7800 Pure hypercholesterolemia, unspecified: Secondary | ICD-10-CM | POA: Diagnosis not present

## 2015-12-13 DIAGNOSIS — E039 Hypothyroidism, unspecified: Secondary | ICD-10-CM | POA: Diagnosis not present

## 2015-12-13 DIAGNOSIS — D649 Anemia, unspecified: Secondary | ICD-10-CM | POA: Diagnosis not present

## 2015-12-13 DIAGNOSIS — Z6831 Body mass index (BMI) 31.0-31.9, adult: Secondary | ICD-10-CM | POA: Diagnosis not present

## 2015-12-13 DIAGNOSIS — G2581 Restless legs syndrome: Secondary | ICD-10-CM | POA: Diagnosis not present

## 2015-12-13 DIAGNOSIS — I10 Essential (primary) hypertension: Secondary | ICD-10-CM

## 2015-12-13 DIAGNOSIS — Z0000 Encounter for general adult medical examination without abnormal findings: Secondary | ICD-10-CM

## 2015-12-13 LAB — POCT CBC
Granulocyte percent: 58.2 %G (ref 37–80)
HCT, POC: 37.1 % — AB (ref 37.7–47.9)
Hemoglobin: 13.1 g/dL (ref 12.2–16.2)
Lymph, poc: 2.4 (ref 0.6–3.4)
MCH, POC: 29.5 pg (ref 27–31.2)
MCHC: 35.3 g/dL (ref 31.8–35.4)
MCV: 83.5 fL (ref 80–97)
MID (cbc): 0.4 (ref 0–0.9)
MPV: 7.2 fL (ref 0–99.8)
PLATELET COUNT, POC: 243 10*3/uL (ref 142–424)
POC GRANULOCYTE: 3.8 (ref 2–6.9)
POC LYMPH PERCENT: 36.2 %L (ref 10–50)
POC MID %: 5.6 %M (ref 0–12)
RBC: 4.44 M/uL (ref 4.04–5.48)
RDW, POC: 13.6 %
WBC: 6.5 10*3/uL (ref 4.6–10.2)

## 2015-12-13 LAB — COMPREHENSIVE METABOLIC PANEL
ALBUMIN: 4 g/dL (ref 3.6–5.1)
ALT: 38 U/L — ABNORMAL HIGH (ref 6–29)
AST: 23 U/L (ref 10–35)
Alkaline Phosphatase: 59 U/L (ref 33–130)
BUN: 15 mg/dL (ref 7–25)
CO2: 24 mmol/L (ref 20–31)
CREATININE: 0.76 mg/dL (ref 0.50–1.05)
Calcium: 9 mg/dL (ref 8.6–10.4)
Chloride: 103 mmol/L (ref 98–110)
Glucose, Bld: 106 mg/dL — ABNORMAL HIGH (ref 65–99)
Potassium: 4.2 mmol/L (ref 3.5–5.3)
Sodium: 139 mmol/L (ref 135–146)
Total Bilirubin: 0.4 mg/dL (ref 0.2–1.2)
Total Protein: 6.8 g/dL (ref 6.1–8.1)

## 2015-12-13 LAB — LIPID PANEL
CHOL/HDL RATIO: 4.3 ratio (ref <=5.0)
Cholesterol: 225 mg/dL — ABNORMAL HIGH (ref 125–200)
HDL: 52 mg/dL (ref >=46)
LDL Cholesterol: 126 mg/dL (ref <130)
Triglycerides: 233 mg/dL — ABNORMAL HIGH (ref <150)
VLDL: 47 mg/dL — ABNORMAL HIGH (ref <30)

## 2015-12-13 LAB — T4, FREE: FREE T4: 1.2 ng/dL (ref 0.8–1.8)

## 2015-12-13 LAB — TSH: TSH: 0.36 mIU/L — ABNORMAL LOW

## 2015-12-13 LAB — FERRITIN: Ferritin: 20 ng/mL (ref 10–232)

## 2015-12-13 MED ORDER — HYDROCHLOROTHIAZIDE 12.5 MG PO CAPS: 12.5000 mg | ORAL_CAPSULE | Freq: Every day | ORAL | Status: DC

## 2015-12-13 MED ORDER — PAROXETINE HCL 20 MG PO TABS: ORAL_TABLET | ORAL | Status: DC

## 2015-12-13 NOTE — Progress Notes (Addendum)
Subjective:  By signing my name below, I, Christine [name], attest that this documentation has been prepared under the direction and in the presence of Christine Lin, MD. Electronically Signed: Moises [name], Christine [name]. 12/13/2015 , 4:23 PM .  Patient was seen in Room 12 .   Patient ID: Christine [name], female    DOB: Jul 17, 1961, 55 y.o.   MRN: [id number] Chief Complaint  Patient presents with   [name] Work    TSH   HPI Christine [name] is a 55 y.o. female who has h/o hypothyroidism presents to [facility] requesting TSH [name] work.  She's ran out of her medication today. She denies missing any of her synthroid medication. She reports having gained some weight recently.   She mentions having shortness of breath at night when she's going to sleep. She would occasionally wake up during her sleep. Her children informs the patient that she snores at night. When she wakes up in the morning, she didn't feel fully rested.   She also reports having restless leg bilaterally, worse on the left. It would stop when she goes to sleep.   Chaos from past more stable//insured again  Patient Active Problem List   Diagnosis Date Noted   Essential hypertension 09/05/2015   Hypothyroid 07/02/2012   Anxiety 07/02/2012   BMI 31.0-31.9,adult 07/02/2012    Current outpatient prescriptions:    hydrochlorothiazide (MICROZIDE) 12.5 MG capsule, Take 1 capsule (12.5 mg total) by mouth daily., Disp: 90 capsule, Rfl: 1   levothyroxine (SYNTHROID, LEVOTHROID) 150 MCG tablet, TAKE 1 TABLET (150 MCG TOTAL) BY MOUTH DAILY, Disp: 90 tablet, Rfl: 1   PARoxetine (PAXIL) 20 MG tablet, TAKE 1 TABLET (20 MG TOTAL) BY MOUTH DAILY., Disp: 90 tablet, Rfl: 1 Allergies  Allergen Reactions   Codeine    FH--son 13 has completed eval for Marfan's(pectus, father early death)-and genetics at [facility] [place] thinks "no"  Review of Systems  Constitutional: Negative for fatigue and unexpected weight change.  Respiratory: Positive for shortness  of breath. Negative for chest tightness.   Cardiovascular: Negative for chest pain, palpitations and leg swelling.  Gastrointestinal: Negative for abdominal pain and [name] in stool.  Neurological: Negative for dizziness, syncope, light-headedness and headaches.      Objective:   Physical Exam  Constitutional: She is oriented to person, place, and time. She appears well-developed and well-nourished. No distress.  HENT:  Head: Normocephalic and atraumatic.  Eyes: EOM are normal. Pupils are equal, round, and reactive to light.  Neck: Neck supple.  Cardiovascular: Normal rate.   Pulmonary/Chest: Effort normal. No respiratory distress.  Musculoskeletal: Normal range of motion.  Neurological: She is alert and oriented to person, place, and time.  Skin: Skin is warm and dry.  Psychiatric: She has a normal mood and affect. Her behavior is normal.  Nursing note and vitals reviewed.  BP 126/80 mmHg   Pulse 82   Temp(Src) 97.7 F (36.5 C) (Oral)   Resp 18   Ht 5' 6.5" (1.689 m)   Wt 226 lb 9.6 oz (102.785 kg)   BMI 36.03 kg/m2   SpO2 99%  200 to 226 since 2013  Results for orders placed or performed in visit on 12/13/15  POCT CBC  Result Value Ref Range   WBC 6.5 4.6 - 10.2 K/uL   Lymph, poc 2.4 0.6 - 3.4   POC LYMPH PERCENT 36.2 10 - 50 %L   MID (cbc) 0.4 0 - 0.9   POC MID % 5.6 0 - 12 %M  POC Granulocyte 3.8 2 - 6.9   Granulocyte percent 58.2 37 - 80 %G   RBC 4.44 4.04 - 5.48 M/uL   Hemoglobin 13.1 12.2 - 16.2 g/dL   HCT, POC 37.1 (A) 37.7 - 47.9 %   MCV 83.5 80 - 97 fL   MCH, POC 29.5 27 - 31.2 pg   MCHC 35.3 31.8 - 35.4 g/dL   RDW, POC 13.6 %   Platelet Count, POC 243 142 - 424 K/uL   MPV 7.2 0 - 99.8 fL      Assessment & Plan:  Hypothyroidism, unspecified hypothyroidism type - Plan: TSH, T4, free -rx levo after labs  Essential hypertension - Plan: Comprehensive metabolic panel//same meds  BMI 31.0-31.9,adult--disc wt loss plan for summer  RLS (restless legs syndrome)  - Plan: Ferritin//not ready to try meds  Elevated LDL cholesterol level - Plan: Lipid panel  Anemia, unspecified anemia type -present  at last exam/ now wnl  Encounter for health maintenance examination - Plan: Hepatitis C antibody  Panic attacks - Plan: PARoxetine (PAXIL) 20 MG tablet continued  Snores-I ask her to have family observe to see if she has sleep apnea given her multiple symptoms that could fit this syndrome  Dr Christine [name] for gyn/mammogr Dr Christine [name] for colo in 5 more yrs Tet was given at 84 she says Eye exam this yr   Meds ordered this encounter  Medications   hydrochlorothiazide (MICROZIDE) 12.5 MG capsule    Sig: Take 1 capsule (12.5 mg total) by mouth daily.    Dispense:  90 capsule    Refill:  3   PARoxetine (PAXIL) 20 MG tablet    Sig: TAKE 1 TABLET (20 MG TOTAL) BY MOUTH DAILY.    Dispense:  90 tablet    Refill:  3   F/u 39m  I have completed the patient encounter in its entirety as documented by the scribe, with editing by me where necessary. Christine [name], M.D.

## 2015-12-13 NOTE — Patient Instructions (Signed)
     IF you received an x-ray today, you will receive an invoice from [facility]. Please contact [facility] at [phone] with questions or concerns regarding your invoice.   IF you received labwork today, you will receive an invoice from [facility]. Please contact [facility] at [phone] with questions or concerns regarding your invoice.   Our billing staff will not be able to assist you with questions regarding bills from these companies.  You will be contacted with the lab results as soon as they are available. The fastest way to get your results is to activate your My Chart account. Instructions are located on the last page of this paperwork. If you have not heard from us regarding the results in 2 weeks, please contact this office.      

## 2015-12-14 LAB — HEPATITIS C ANTIBODY: HCV Ab: NEGATIVE

## 2015-12-15 ENCOUNTER — Encounter: Payer: Self-pay | Admitting: Internal Medicine

## 2015-12-15 MED ORDER — LEVOTHYROXINE SODIUM 150 MCG PO TABS: ORAL_TABLET | ORAL | Status: DC

## 2015-12-15 NOTE — Addendum Note (Signed)
Addended by: Leandrew Koyanagi on: 12/15/2015 12:27 PM   Modules accepted: Orders

## 2017-01-04 ENCOUNTER — Ambulatory Visit (HOSPITAL_COMMUNITY)
Admission: EM | Admit: 2017-01-04 | Discharge: 2017-01-04 | Disposition: A | Discharge status: Home / Self Care | Payer: Self-pay | Attending: Internal Medicine | Admitting: Internal Medicine

## 2017-01-04 ENCOUNTER — Encounter (HOSPITAL_COMMUNITY): Payer: Self-pay | Admitting: Family Medicine

## 2017-01-04 DIAGNOSIS — J209 Acute bronchitis, unspecified: Secondary | ICD-10-CM

## 2017-01-04 HISTORY — DX: Essential (primary) hypertension: I10

## 2017-01-04 MED ORDER — IPRATROPIUM-ALBUTEROL 0.5-2.5 (3) MG/3ML IN SOLN
3.0000 mL | Freq: Once | RESPIRATORY_TRACT | Status: AC
  Administered 2017-01-04: 3 mL via RESPIRATORY_TRACT

## 2017-01-04 MED ORDER — ALBUTEROL SULFATE HFA 108 (90 BASE) MCG/ACT IN AERS: 2.0000 | INHALATION_SPRAY | RESPIRATORY_TRACT | 0 refills | Status: DC | PRN

## 2017-01-04 MED ORDER — IPRATROPIUM-ALBUTEROL 0.5-2.5 (3) MG/3ML IN SOLN
RESPIRATORY_TRACT | Status: AC
  Filled 2017-01-04: qty 3

## 2017-01-04 MED ORDER — BENZONATATE 200 MG PO CAPS: 200.0000 mg | ORAL_CAPSULE | Freq: Three times a day (TID) | ORAL | 1 refills | Status: DC | PRN

## 2017-01-04 MED ORDER — PREDNISONE 50 MG PO TABS: 50.0000 mg | ORAL_TABLET | Freq: Every day | ORAL | 0 refills | Status: AC

## 2017-01-04 NOTE — ED Triage Notes (Signed)
Pt here for cough x 1 week. sts fatigue, aching. sts OTC medication for the symptoms.

## 2017-01-04 NOTE — ED Provider Notes (Signed)
[facility]    CSN: [id number] Arrival date & time: 01/04/17  1328     History   Chief Complaint Chief Complaint  Patient presents with  . Cough    HPI Christine [name] is a 56 y.o. female. She presents today with about a 5 day history of chest congestion/tightness, cough. Some postnasal drainage. Nausea but no vomiting. No diarrhea. She is tired and achy. No sore throat. She has used steroid inhalers in the past, with good relief.    HPI  Past Medical History:  Diagnosis Date  . Hypertension     Patient Active Problem List   Diagnosis Date Noted  . Essential hypertension 09/05/2015  . Hypothyroid 07/02/2012  . Anxiety 07/02/2012  . BMI 31.0-31.9,adult 07/02/2012    History reviewed. No pertinent surgical history.    Home Medications    Prior to Admission medications   Medication Sig Start Date End Date Taking? Authorizing Provider  albuterol (PROVENTIL HFA;VENTOLIN HFA) 108 (90 Base) MCG/ACT inhaler Inhale 2 puffs into the lungs every 4 (four) hours as needed for wheezing or shortness of breath. 01/04/17   Sherlene Shams, MD  benzonatate (TESSALON) 200 MG capsule Take 1 capsule (200 mg total) by mouth 3 (three) times daily as needed for cough. 01/04/17   Sherlene Shams, MD  hydrochlorothiazide (MICROZIDE) 12.5 MG capsule Take 1 capsule (12.5 mg total) by mouth daily. 12/13/15   Leandrew Koyanagi, MD  levothyroxine (SYNTHROID, LEVOTHROID) 150 MCG tablet TAKE 1 TABLET (150 MCG TOTAL) BY MOUTH DAILY 12/15/15   Leandrew Koyanagi, MD  PARoxetine (PAXIL) 20 MG tablet TAKE 1 TABLET (20 MG TOTAL) BY MOUTH DAILY. 12/13/15   Leandrew Koyanagi, MD  predniSONE (DELTASONE) 50 MG tablet Take 1 tablet (50 mg total) by mouth daily. 01/04/17 01/09/17  Sherlene Shams, MD    Family History History reviewed. No pertinent family history.  Social History Social History  Substance Use Topics  . Smoking status: Never Smoker  . Smokeless tobacco: Never Used  . Alcohol use Not  on file     Allergies   Codeine   Review of Systems Review of Systems  All other systems reviewed and are negative.    Physical Exam Triage Vital Signs ED Triage Vitals  Enc Vitals Group     BP 01/04/17 1349 (!) 154/90     Pulse Rate 01/04/17 1349 (!) 102     Resp 01/04/17 1349 16     Temp 01/04/17 1349 98.4 F (36.9 C)     Temp Source 01/04/17 1349 Oral     SpO2 01/04/17 1349 96 %     Weight --      Height --      Pain Score 01/04/17 1350 5     Pain Loc --    Updated Vital Signs BP (!) 154/90 (BP Location: Right Arm) Comment: rn notified  Pulse (!) 102 Comment: rn notified  Temp 98.4 F (36.9 C) (Oral)   Resp 16   SpO2 96%   Physical Exam  Constitutional: She is oriented to person, place, and time. No distress.  HENT:  Head: Atraumatic.  Bilateral TMs dull, no erythema Moderate nasal congestion bilaterally Throat is moderately injected with clear postnasal drainage evident  Eyes:  Conjugate gaze observed, no eye redness/discharge  Neck: Neck supple.  Cardiovascular: Normal rate and regular rhythm.   Pulmonary/Chest: No respiratory distress. She has no wheezes. She has no rales.  Slightly coarse but symmetric breath sounds  throughout Some cough with deep breath  Abdominal: She exhibits no distension.  Musculoskeletal: Normal range of motion.  Neurological: She is alert and oriented to person, place, and time.  Skin: Skin is warm and dry.  Nursing note and vitals reviewed.    UC Treatments / Results   Procedures Procedures (including critical care time)  Medications Ordered in UC Medications  ipratropium-albuterol (DUONEB) 0.5-2.5 (3) MG/3ML nebulizer solution 3 mL (3 mLs Nebulization Given 01/04/17 1558)   Felt like she was breathing easier after the duoneb tx.    Final Clinical Impressions(s) / UC Diagnoses   Final diagnoses:  Acute bronchitis with bronchospasm   Anticipate gradual improvement over the next several days.  Cough may take a  couple weeks to subside.  Prescriptions for prednisone (steroid, for airway irritation/cough), albuterol inhaler, and benzonatate (for cough) were sent to the pharmacy.  Recheck for new fever >100.5, increasing phlegm production/nasal discharge, or if not starting to improve in a few days.    New Prescriptions Discharge Medication List as of 01/04/2017  4:22 PM    START taking these medications   Details  albuterol (PROVENTIL HFA;VENTOLIN HFA) 108 (90 Base) MCG/ACT inhaler Inhale 2 puffs into the lungs every 4 (four) hours as needed for wheezing or shortness of breath., Starting Fri 01/04/2017, Normal    benzonatate (TESSALON) 200 MG capsule Take 1 capsule (200 mg total) by mouth 3 (three) times daily as needed for cough., Starting Fri 01/04/2017, Normal    predniSONE (DELTASONE) 50 MG tablet Take 1 tablet (50 mg total) by mouth daily., Starting Fri 01/04/2017, Until Wed 01/09/2017, Normal         Sherlene Shams, MD 01/05/17 [phone]

## 2017-01-04 NOTE — Discharge Instructions (Addendum)
Anticipate gradual improvement over the next several days.  Cough may take a couple weeks to subside.  Prescriptions for prednisone (steroid, for airway irritation/cough), albuterol inhaler, and benzonatate (for cough) were sent to the pharmacy.  Recheck for new fever >100.5, increasing phlegm production/nasal discharge, or if not starting to improve in a few days.

## 2017-02-18 ENCOUNTER — Telehealth: Payer: Self-pay | Admitting: Family Medicine

## 2017-02-18 DIAGNOSIS — F410 Panic disorder [episodic paroxysmal anxiety] without agoraphobia: Secondary | ICD-10-CM

## 2017-02-18 NOTE — Telephone Encounter (Signed)
DR Carlota Raspberry PT IS A FORMER [facility]OL[facility]LE PT AND NEED A REFILL ON HER SYNTHROID MICROZIDE AND PAXIL UNTIL HER APPOINTMENT WITH YOU ON AUGUST 9TH SHE IS SELF PAY AND COULDN'T SEE YOU UNTIL AFTER THE 25TH OF THIS MONTH  PLEASE SEND MEDICINE TO [facility] ON [address]

## 2017-02-19 MED ORDER — PAROXETINE HCL 20 MG PO TABS: ORAL_TABLET | ORAL | 0 refills | Status: DC

## 2017-02-19 MED ORDER — LEVOTHYROXINE SODIUM 150 MCG PO TABS: ORAL_TABLET | ORAL | 0 refills | Status: DC

## 2017-02-19 MED ORDER — HYDROCHLOROTHIAZIDE 12.5 MG PO CAPS: 12.5000 mg | ORAL_CAPSULE | Freq: Every day | ORAL | 0 refills | Status: DC

## 2017-02-19 NOTE — Telephone Encounter (Signed)
Rx filled for 30 days only. Apt for 03/14/17 needs to keep appointment for further refills.

## 2017-03-14 ENCOUNTER — Ambulatory Visit: Payer: BLUE CROSS/BLUE SHIELD | Admitting: Family Medicine

## 2017-03-26 ENCOUNTER — Other Ambulatory Visit: Payer: Self-pay | Admitting: Family Medicine

## 2017-03-26 DIAGNOSIS — F410 Panic disorder [episodic paroxysmal anxiety] without agoraphobia: Secondary | ICD-10-CM

## 2017-03-27 NOTE — Telephone Encounter (Signed)
Please call and schedule patient and appt within 14 days with PCP of their choice before there medications run out.

## 2017-04-04 ENCOUNTER — Ambulatory Visit: Payer: BLUE CROSS/BLUE SHIELD | Admitting: Family Medicine

## 2017-04-16 ENCOUNTER — Ambulatory Visit (INDEPENDENT_AMBULATORY_CARE_PROVIDER_SITE_OTHER): Payer: Self-pay | Admitting: Family Medicine

## 2017-04-16 ENCOUNTER — Encounter: Payer: Self-pay | Admitting: Family Medicine

## 2017-04-16 VITALS — BP 127/79 | HR 76 | Temp 98.4°F | Resp 17 | Ht 67.5 in | Wt 225.0 lb

## 2017-04-16 DIAGNOSIS — R5383 Other fatigue: Secondary | ICD-10-CM

## 2017-04-16 DIAGNOSIS — F410 Panic disorder [episodic paroxysmal anxiety] without agoraphobia: Secondary | ICD-10-CM

## 2017-04-16 DIAGNOSIS — I10 Essential (primary) hypertension: Secondary | ICD-10-CM

## 2017-04-16 DIAGNOSIS — F418 Other specified anxiety disorders: Secondary | ICD-10-CM

## 2017-04-16 DIAGNOSIS — E039 Hypothyroidism, unspecified: Secondary | ICD-10-CM

## 2017-04-16 MED ORDER — PAROXETINE HCL 20 MG PO TABS: 20.0000 mg | ORAL_TABLET | Freq: Every day | ORAL | 1 refills | Status: DC

## 2017-04-16 MED ORDER — LEVOTHYROXINE SODIUM 150 MCG PO TABS: 150.0000 ug | ORAL_TABLET | Freq: Every day | ORAL | 1 refills | Status: DC

## 2017-04-16 MED ORDER — HYDROCHLOROTHIAZIDE 12.5 MG PO CAPS: ORAL_CAPSULE | ORAL | 1 refills | Status: DC

## 2017-04-16 NOTE — Patient Instructions (Addendum)
  No change in meds for now, but start low impact exercise as planned to see if that help with depression symptoms over the next 4-6 weeks.  Follow up if those symptoms are worsening.    If still having fatigue during the day, or difficulty with sleep after exercise has started, then would consider meeting with sleep specialist.   Schedule physical in next few months, but return to recheck depression symptoms, fatigue, sleep issues in 6 weeks.. Return to the clinic or go to the nearest emergency room if any of your symptoms worsen or new symptoms occur.    IF you received an x-ray today, you will receive an invoice from [facility]. Please contact [facility] at [phone] with questions or concerns regarding your invoice.   IF you received labwork today, you will receive an invoice from [facility]. Please contact [facility] at [phone] with questions or concerns regarding your invoice.   Our billing staff will not be able to assist you with questions regarding bills from these companies.  You will be contacted with the lab results as soon as they are available. The fastest way to get your results is to activate your My Chart account. Instructions are located on the last page of this paperwork. If you have not heard from [place] regarding the results in 2 weeks, please contact this office.

## 2017-04-16 NOTE — Progress Notes (Signed)
Subjective:    Patient ID: Christine [name], female    DOB: Aug 10, 1960, 56 y.o.   MRN: [id number]  HPI  Christine [name] is a 56 y.o. female Presents today for: Chief Complaint  Patient presents with  . Medication Refill    synthroid   . Establish Care  . Depression   Here to establish care. Previous patient of Dr. Laney Pastor. History of hypothyroidism, depression, anxiety, hypertension. Last visit here 12/13/2015.  Hypertension Takes HCTZ 12.5 mg daily. Blood pressure slightly elevated at 154/9o when seen in the ER in June for bronchitis.  She did endorse some symptoms of restless legs at bedtime wetting, shortness of breath at night when going to sleep with occasionally waking up in her sleep, along with snoring when seen in 2017. Planned to have family monitor her symptoms to observe for possible sleep apnea. Denies snoring, but sleepy throughout the day.  Lab Results  Component Value Date   CREATININE 0.76 12/13/2015     Hypothyroidism She takes Synthroid 150 MCG daily.  Most recent TSH tested in May was low, but free T4 was normal at 1.2. Occasional missed dose, but usually QD.  No recent weight, hair or skin changes, no temperature tolerance changes.  Lab Results  Component Value Date   TSH 0.36 (L) 12/13/2015    Anxiety Takes Paxil 20 mg daily. Rare missed dose. Denies recent anxiety or panic attacks, which was initial reason for that med. Occasional depression symptoms - feeling down at times. Going through menopause. Menses are spacing out to every 4-5 months. Feels like affecting motivation. Single parent. No regular exercise. Would like to try exercise first.   Depression screen [facility] 2/9 04/16/2017 12/13/2015 09/04/2015  Decreased Interest 2 0 2  Down, Depressed, Hopeless 2 0 1  PHQ - 2 Score 4 0 3  Altered sleeping 2 - 1  Tired, decreased energy 2 - 2  Change in appetite 0 - 0  Feeling bad or failure about yourself  2 - 3  Trouble concentrating 2 - 1  Moving slowly or  fidgety/restless 0 - 3  Suicidal thoughts 0 - 0  PHQ-9 Score 12 - 13  Difficult doing work/chores Somewhat difficult - Somewhat difficult      Patient Active Problem List   Diagnosis Date Noted  . Essential hypertension 09/05/2015  . Hypothyroid 07/02/2012  . Anxiety 07/02/2012  . BMI 31.0-31.9,adult 07/02/2012   Past Medical History:  Diagnosis Date  . Hypertension    No past surgical history on file. Allergies  Allergen Reactions  . Codeine    Prior to Admission medications   Medication Sig Start Date End Date Taking? Authorizing Provider  albuterol (PROVENTIL HFA;VENTOLIN HFA) 108 (90 Base) MCG/ACT inhaler Inhale 2 puffs into the lungs every 4 (four) hours as needed for wheezing or shortness of breath. 01/04/17  Yes Sherlene Shams, MD  benzonatate (TESSALON) 200 MG capsule Take 1 capsule (200 mg total) by mouth 3 (three) times daily as needed for cough. 01/04/17  Yes Sherlene Shams, MD  hydrochlorothiazide (MICROZIDE) 12.5 MG capsule TAKE 1 CAPSULE BY MOUTH EVERY DAY 03/27/17  Yes Weber, Damaris Hippo, PA-C  levothyroxine (SYNTHROID, LEVOTHROID) 150 MCG tablet TAKE 1 TABLET BY MOUTH EVERY DAY 03/27/17  Yes Weber, Sarah L, PA-C  PARoxetine (PAXIL) 20 MG tablet TAKE 1 TABLET BY MOUTH EVERY DAY 03/27/17  Yes Weber, Damaris Hippo, PA-C   Social History   Social History  . Marital status: Widowed  Spouse name: N/A  . Number of children: N/A  . Years of education: N/A   Occupational History  . Not on file.   Social History Main Topics  . Smoking status: Never Smoker  . Smokeless tobacco: Never Used  . Alcohol use No  . Drug use: No  . Sexual activity: No   Other Topics Concern  . Not on file   Social History Narrative  . No narrative on file    Review of Systems  Constitutional: Negative for fatigue and unexpected weight change.  Respiratory: Positive for shortness of breath (rare, no recent increase.). Negative for chest tightness.   Cardiovascular: Negative for chest pain,  palpitations and leg swelling.  Gastrointestinal: Negative for abdominal pain and blood in stool.  Musculoskeletal: Positive for arthralgias (sometimes in hip, knee).  Neurological: Negative for dizziness (Rare, no recent increase.), syncope, light-headedness and headaches.       Objective:   Physical Exam  Constitutional: She is oriented to person, place, and time. She appears well-developed and well-nourished.  Overweight.  HENT:  Head: Normocephalic and atraumatic.  Eyes: Pupils are equal, round, and reactive to light. Conjunctivae and EOM are normal.  Neck: Carotid bruit is not present.  Cardiovascular: Normal rate, regular rhythm, normal heart sounds and intact distal pulses.   Pulmonary/Chest: Effort normal and breath sounds normal.  Abdominal: Soft. She exhibits no pulsatile midline mass. There is no tenderness.  Neurological: She is alert and oriented to person, place, and time.  Skin: Skin is warm and dry.  Psychiatric: She has a normal mood and affect. Her behavior is normal.  Vitals reviewed.  Vitals:   04/16/17 0823  BP: 127/79  Pulse: 76  Resp: 17  Temp: 98.4 F (36.9 C)  TempSrc: Oral  SpO2: 98%  Weight: 225 lb (102.1 kg)  Height: 5' 7.5" (1.715 m)   Body mass index is 34.72 kg/m.     Assessment & Plan:   Christine [name] is a 56 y.o. female Essential hypertension - Plan: Basic metabolic panel   Stable, continue same dose of HCTZ. Check BMP as not fasting.  Hypothyroidism, unspecified type - Plan: TSH + free T4  - Continue Synthroid same dose, prior TSH borderline low, check free T4. Plans on change to more plant-based diet. Could check TSH for stability with diet changes.  Panic attacks - Plan: PARoxetine (PAXIL) 20 MG tablet Anxiety with depression  - Less panic/anxiety symptoms, does endorse some more depression type symptoms. May be coupled with menopause symptoms. Discussed med change options, would like to stay same dose of Paxil, with plans on  increasing exercise. Recheck in 6 weeks to determine control of symptoms. Sooner if worse. Denies SI.  Fatigue, unspecified type - Plan: Basic metabolic panel, CBC  - Check CBC, BMP, plan for repeat discussion in 6 weeks after starting exercise to see if that improves. Also endorses rare shortness of breath/dizziness, but no recent changes. Recommended increased fluids and diet, low intensity exercise to begin with, and RTC precautions if the symptoms increase or worsen.  - Would also consider sleep specialist eval if persistent difficulty with sleep as suspected restless leg syndrome, but OSA possible.  Meds ordered this encounter  Medications  . hydrochlorothiazide (MICROZIDE) 12.5 MG capsule    Sig: TAKE 1 CAPSULE BY MOUTH EVERY DAY    Dispense:  90 capsule    Refill:  1  . levothyroxine (SYNTHROID, LEVOTHROID) 150 MCG tablet    Sig: Take 1 tablet (150 mcg  total) by mouth daily.    Dispense:  90 tablet    Refill:  1  . PARoxetine (PAXIL) 20 MG tablet    Sig: Take 1 tablet (20 mg total) by mouth daily.    Dispense:  90 tablet    Refill:  1   Patient Instructions    No change in meds for now, but start low impact exercise as planned to see if that help with depression symptoms over the next 4-6 weeks.  Follow up if those symptoms are worsening.    If still having fatigue during the day, or difficulty with sleep after exercise has started, then would consider meeting with sleep specialist.   Schedule physical in next few months, but return to recheck depression symptoms, fatigue, sleep issues in 6 weeks.. Return to the clinic or go to the nearest emergency room if any of your symptoms worsen or new symptoms occur.    IF you received an x-ray today, you will receive an invoice from [facility]. Please contact [facility] at [phone] with questions or concerns regarding your invoice.   IF you received labwork today, you will receive an invoice from [place].  Please contact [facility] at [phone] with questions or concerns regarding your invoice.   Our billing staff will not be able to assist you with questions regarding bills from these companies.  You will be contacted with the lab results as soon as they are available. The fastest way to get your results is to activate your My Chart account. Instructions are located on the last page of this paperwork. If you have not heard from [place] regarding the results in 2 weeks, please contact this office.       Signed,   Merri Ray, MD [facility] [place].  04/16/17 9:08 AM

## 2017-04-17 LAB — CBC
HEMATOCRIT: 40.4 % (ref 34.0–46.6)
Hemoglobin: 13.3 g/dL (ref 11.1–15.9)
MCH: 29.1 pg (ref 26.6–33.0)
MCHC: 32.9 g/dL (ref 31.5–35.7)
MCV: 88 fL (ref 79–97)
Platelets: 290 10*3/uL (ref 150–379)
RBC: 4.57 x10E6/uL (ref 3.77–5.28)
RDW: 14.1 % (ref 12.3–15.4)
WBC: 5.3 10*3/uL (ref 3.4–10.8)

## 2017-04-17 LAB — BASIC METABOLIC PANEL
BUN/Creatinine Ratio: 17 (ref 9–23)
BUN: 13 mg/dL (ref 6–24)
CO2: 24 mmol/L (ref 20–29)
Calcium: 9.4 mg/dL (ref 8.7–10.2)
Chloride: 103 mmol/L (ref 96–106)
Creatinine, Ser: 0.76 mg/dL (ref 0.57–1.00)
GFR calc non Af Amer: 89 mL/min/{1.73_m2} (ref >59)
GFR, EST AFRICAN AMERICAN: 102 mL/min/{1.73_m2} (ref >59)
Glucose: 133 mg/dL — ABNORMAL HIGH (ref 65–99)
Potassium: 4.3 mmol/L (ref 3.5–5.2)
Sodium: 142 mmol/L (ref 134–144)

## 2017-04-17 LAB — TSH+FREE T4
Free T4: 1.34 ng/dL (ref 0.82–1.77)
TSH: 3.61 u[IU]/mL (ref 0.450–4.500)

## 2017-05-09 ENCOUNTER — Encounter: Payer: Self-pay | Admitting: *Deleted

## 2017-05-30 ENCOUNTER — Ambulatory Visit: Payer: Self-pay | Admitting: Family Medicine

## 2017-11-04 ENCOUNTER — Other Ambulatory Visit: Payer: Self-pay | Admitting: Family Medicine

## 2017-11-04 DIAGNOSIS — F410 Panic disorder [episodic paroxysmal anxiety] without agoraphobia: Secondary | ICD-10-CM

## 2017-11-06 ENCOUNTER — Other Ambulatory Visit: Payer: Self-pay | Admitting: Family Medicine

## 2017-11-06 DIAGNOSIS — F410 Panic disorder [episodic paroxysmal anxiety] without agoraphobia: Secondary | ICD-10-CM

## 2017-11-28 ENCOUNTER — Other Ambulatory Visit: Payer: Self-pay | Admitting: Family Medicine

## 2017-11-28 ENCOUNTER — Other Ambulatory Visit: Payer: Self-pay

## 2017-11-28 MED ORDER — LEVOTHYROXINE SODIUM 150 MCG PO TABS: 150.0000 ug | ORAL_TABLET | Freq: Every day | ORAL | 0 refills | Status: DC

## 2017-12-11 ENCOUNTER — Other Ambulatory Visit: Payer: Self-pay | Admitting: Family Medicine

## 2017-12-25 ENCOUNTER — Encounter: Payer: Self-pay | Admitting: Family Medicine

## 2017-12-25 ENCOUNTER — Ambulatory Visit: Payer: BLUE CROSS/BLUE SHIELD | Admitting: Family Medicine

## 2017-12-25 VITALS — BP 142/88 | HR 70 | Temp 97.8°F | Ht 66.5 in | Wt 228.2 lb

## 2017-12-25 DIAGNOSIS — M25512 Pain in left shoulder: Secondary | ICD-10-CM | POA: Diagnosis not present

## 2017-12-25 DIAGNOSIS — I10 Essential (primary) hypertension: Secondary | ICD-10-CM

## 2017-12-25 DIAGNOSIS — E039 Hypothyroidism, unspecified: Secondary | ICD-10-CM

## 2017-12-25 DIAGNOSIS — M25511 Pain in right shoulder: Secondary | ICD-10-CM | POA: Diagnosis not present

## 2017-12-25 DIAGNOSIS — F410 Panic disorder [episodic paroxysmal anxiety] without agoraphobia: Secondary | ICD-10-CM

## 2017-12-25 DIAGNOSIS — R739 Hyperglycemia, unspecified: Secondary | ICD-10-CM | POA: Diagnosis not present

## 2017-12-25 DIAGNOSIS — M25551 Pain in right hip: Secondary | ICD-10-CM

## 2017-12-25 DIAGNOSIS — Z13220 Encounter for screening for lipoid disorders: Secondary | ICD-10-CM

## 2017-12-25 LAB — GLUCOSE, POCT (MANUAL RESULT ENTRY): POC Glucose: 91 mg/dl (ref 70–99)

## 2017-12-25 LAB — POCT GLYCOSYLATED HEMOGLOBIN (HGB A1C): HEMOGLOBIN A1C: 5.7 % — AB (ref 4.0–5.6)

## 2017-12-25 MED ORDER — HYDROCHLOROTHIAZIDE 12.5 MG PO CAPS: 12.5000 mg | ORAL_CAPSULE | Freq: Every day | ORAL | 1 refills | Status: DC

## 2017-12-25 MED ORDER — LEVOTHYROXINE SODIUM 150 MCG PO TABS: 150.0000 ug | ORAL_TABLET | Freq: Every day | ORAL | 1 refills | Status: DC

## 2017-12-25 MED ORDER — PAROXETINE HCL 20 MG PO TABS: 20.0000 mg | ORAL_TABLET | Freq: Every day | ORAL | 1 refills | Status: DC

## 2017-12-25 NOTE — Progress Notes (Signed)
Subjective:  By signing my name below, I, Christine [name], attest that this documentation has been prepared under the direction and in the presence of Wendie Agreste, MD Electronically Signed: Ladene Artist, ED Scribe 12/25/2017 at 8:26 AM.   Patient ID: Christine [name], female    DOB: 1960/10/02, 57 y.o.   MRN: [id number]  Chief Complaint  Patient presents with  . Medication Refill    has not been taking bP meds in a week   . Chronic condition     follow up.  PHQ9=8 and Has had falls    HPI  Christine [name] is a 57 y.o. female who presents to Primary Care at [facility] for f/u. H/o HTN, hypothyroidism, depression, anxiety. Last seen Sept 2018; here to establish care at that time. Previously followed by Dr. Laney Pastor.  HTN HCTZ 12.5 mg qd. BP stable at last visit. Continue same dose. - Pt ran out of meds 1 wk ago. She had not checked BP outside of the office. States she has been unable to get to the pharmacy and unable to afford meds at this time. Declined meeting with a social worker to discuss cost of meds. Denies new symptoms. BP Readings from Last 3 Encounters:  12/25/17 (!) 142/88  04/16/17 127/79  01/04/17 (!) 154/90   Lab Results  Component Value Date   CREATININE 0.76 04/16/2017   Hypothyroidism  Lab Results  Component Value Date   TSH 3.610 04/16/2017  Synthroid 150 mcg qd. - Denies changes in weight, skin, hair, nails, heat/cold intolerance.  Hyperglycemia Noted on prev labs in Sept. Advised f/u in a few months for rpt testing.- Pt had a sip of Diet Dr. Malachi Bonds this morning.  Depression/Anxiety Paxil 20 mg qd. Reported she was going through menopause at last visit and having occasional depression symptoms. Planned on trying exercise to help with symptoms. Declined change in dose in Paxil at that visit. Advised to f/u in 6 wks. Has not returned since Sept 2018. Did have symptoms of possible RLS and some sleep difficulty. Planned to discuss further at f/u to see if improved with  exercise. - States she has been exercising and noticed improvement in motivation and energy. Denies falls with exercising. She has noticed some panic-attack symptoms but denies sob. Depression screen [facility] 2/9 12/25/2017 04/16/2017 12/13/2015 09/04/2015  Decreased Interest 2 2 0 2  Down, Depressed, Hopeless 0 2 0 1  PHQ - 2 Score 2 4 0 3  Altered sleeping 1 2 - 1  Tired, decreased energy 2 2 - 2  Change in appetite 0 0 - 0  Feeling bad or failure about yourself  2 2 - 3  Trouble concentrating 1 2 - 1  Moving slowly or fidgety/restless 0 0 - 3  Suicidal thoughts 0 0 - 0  PHQ-9 Score 8 12 - 13  Difficult doing work/chores Somewhat difficult Somewhat difficult - Somewhat difficult   Falls Pt reports trip and fall while carrying heavy furniture. States she stubbed her foot on carpet and suspects that she's just not picked her feet up. Reports another fall that occurred while she was walking her dog that started chasing another animal, jerked her and she fell.  Arthralgias Pt reports R hip x months and bilateral shoulder pain, L worse than R, for several months. States R hip occasionally feels like it "dislocates". Denies fall or injury prior to onset of pain. No treatments tried PTA. Plans to f/u with chiropractor.  Patient Active Problem List  Diagnosis Date Noted  . Essential hypertension 09/05/2015  . Hypothyroid 07/02/2012  . Anxiety 07/02/2012  . BMI 31.0-31.9,adult 07/02/2012   Past Medical History:  Diagnosis Date  . Hypertension    No past surgical history on file. Allergies  Allergen Reactions  . Codeine    Prior to Admission medications   Medication Sig Start Date End Date Taking? Authorizing Provider  albuterol (PROVENTIL HFA;VENTOLIN HFA) 108 (90 Base) MCG/ACT inhaler Inhale 2 puffs into the lungs every 4 (four) hours as needed for wheezing or shortness of breath. 01/04/17   Wynona Luna, MD  benzonatate (TESSALON) 200 MG capsule Take 1 capsule (200 mg total) by mouth  3 (three) times daily as needed for cough. 01/04/17   Wynona Luna, MD  hydrochlorothiazide (MICROZIDE) 12.5 MG capsule TAKE 1 CAPSULE BY MOUTH EVERY DAY 12/11/17   Wendie Agreste, MD  levothyroxine (SYNTHROID, LEVOTHROID) 150 MCG tablet Take 1 tablet (150 mcg total) by mouth daily. 11/28/17   Wendie Agreste, MD  PARoxetine (PAXIL) 20 MG tablet TAKE 1 TABLET BY MOUTH EVERY DAY 11/05/17   Wendie Agreste, MD   Social History   Socioeconomic History  . Marital status: Widowed    Spouse name: Not on file  . Number of children: Not on file  . Years of education: Not on file  . Highest education level: Not on file  Occupational History  . Not on file  Social Needs  . Financial resource strain: Not on file  . Food insecurity:    Worry: Not on file    Inability: Not on file  . Transportation needs:    Medical: Not on file    Non-medical: Not on file  Tobacco Use  . Smoking status: Never Smoker  . Smokeless tobacco: Never Used  Substance and Sexual Activity  . Alcohol use: No  . Drug use: No  . Sexual activity: Never  Lifestyle  . Physical activity:    Days per week: Not on file    Minutes per session: Not on file  . Stress: Not on file  Relationships  . Social connections:    Talks on phone: Not on file    Gets together: Not on file    Attends religious service: Not on file    Active member of club or organization: Not on file    Attends meetings of clubs or organizations: Not on file    Relationship status: Not on file  . Intimate partner violence:    Fear of current or ex partner: Not on file    Emotionally abused: Not on file    Physically abused: Not on file    Forced sexual activity: Not on file  Other Topics Concern  . Not on file  Social History Narrative  . Not on file   Review of Systems  Constitutional: Negative for unexpected weight change.  Endocrine: Negative for cold intolerance and heat intolerance.  Musculoskeletal: Positive for arthralgias.    Skin: Negative for color change.      Objective:   Physical Exam  Constitutional: She is oriented to person, place, and time. She appears well-developed and well-nourished.  HENT:  Head: Normocephalic and atraumatic.  Eyes: Pupils are equal, round, and reactive to light. Conjunctivae and EOM are normal.  Neck: Carotid bruit is not present. No thyromegaly present.  Cardiovascular: Normal rate, regular rhythm, normal heart sounds and intact distal pulses.  Pulmonary/Chest: Effort normal and breath sounds normal.  Abdominal: Soft. She exhibits  no pulsatile midline mass. There is no tenderness.  Musculoskeletal:  Bilateral shoulders: FROM. Full RTC strength. No Christine [name], AC, clavicle tenderness. Mildly pos Neer bilaterally. R hip: pain free internal/external rotation R knee: FROM  Neurological: She is alert and oriented to person, place, and time.  Skin: Skin is warm and dry.  Psychiatric: She has a normal mood and affect. Her behavior is normal.  Vitals reviewed.  Vitals:   12/25/17 0819  BP: (!) 142/88  Pulse: 70  Temp: 97.8 F (36.6 C)  TempSrc: Oral  SpO2: 97%  Weight: 228 lb 3.2 oz (103.5 kg)  Height: 5' 6.5" (1.689 m)      Assessment & Plan:    KHALIE WINCE is a 57 y.o. female Hyperglycemia - Plan: POCT glucose (manual entry), POCT glycosylated hemoglobin (Hb A1C)  - check A1c as prior mild elevation to screen for diabetes.   Essential hypertension - Plan: hydrochlorothiazide (MICROZIDE) 12.5 MG capsule, Comprehensive metabolic panel  - elevated off meds. Check labs, restart prior HCTZ dose and recheck if elevated on meds.   Screening for hyperlipidemia - Plan: Lipid panel  Hypothyroidism, unspecified type - Plan: TSH, levothyroxine (SYNTHROID, LEVOTHROID) 150 MCG tablet  - denies new symptoms, tolerating synthroid at current dose. Check labs, continue same dose.   Bilateral shoulder pain, unspecified chronicity  - possible RTC tendinosis vs bursitis with positive neer  testing. trial of ROM, tylenol otc and plans to return for recheck with possible imaging if needed  Right hip pain  - may be more lumbar source. No known injury, reassuring initial exam. Tylenol otc and follow up to discuss further.   Panic attacks - Plan: PARoxetine (PAXIL) 20 MG tablet  -deprression/anxiety reportedly improved with exercise. Continue same dose paxil for now.   Meds ordered this encounter  Medications  . hydrochlorothiazide (MICROZIDE) 12.5 MG capsule    Sig: Take 1 capsule (12.5 mg total) by mouth daily.    Dispense:  90 capsule    Refill:  1  . PARoxetine (PAXIL) 20 MG tablet    Sig: Take 1 tablet (20 mg total) by mouth daily.    Dispense:  90 tablet    Refill:  1  . levothyroxine (SYNTHROID, LEVOTHROID) 150 MCG tablet    Sig: Take 1 tablet (150 mcg total) by mouth daily.    Dispense:  90 tablet    Refill:  1   Patient Instructions   Restart blood pressure medicine at same dose for now. Keep a record of your blood pressures outside of the office and if running over 140/90 - let me know so we can look at dose changes.   It sounds like the falls are mechanical.  If you are having dizziness, or new balance issues return right away to discuss other evaluation.   You may have some rotator cuff syndrome or bursitis of the shoulders, and hip/leg symptoms could be related to wear and tear/arthritis of the back or hip.  Tylenol would likely be the safest initial approach.  Follow-up with me in the next month to discuss those areas further with possible x-rays.  Try tylenol for now.Return to the clinic or go to the nearest emergency room if any of your symptoms worsen or new symptoms occur.  Blood sugar test barely at level of prediabetes.  Exercise, watch diet, recheck levels in 3 to 6 months.  I have included some information on prediabetes below.  Thank you for coming in today.    Prediabetes Prediabetes is  the condition of having a blood sugar (blood glucose) level  that is higher than it should be, but not high enough for you to be diagnosed with type 2 diabetes. Having prediabetes puts you at risk for developing type 2 diabetes (type 2 diabetes mellitus). Prediabetes may be called impaired glucose tolerance or impaired fasting glucose. Prediabetes usually does not cause symptoms. Your health care provider can diagnose this condition with blood tests. You may be tested for prediabetes if you are overweight and if you have at least one other risk factor for prediabetes. Risk factors for prediabetes include:  Having a family member with type 2 diabetes.  Being overweight or obese.  Being older than age 67.  Being of American-Indian, African-American, Hispanic/Latino, or Asian/Pacific Islander descent.  Having an inactive (sedentary) lifestyle.  Having a history of gestational diabetes or polycystic ovarian syndrome (PCOS).  Having low levels of good cholesterol (HDL-C) or high levels of blood fats (triglycerides).  Having high blood pressure.  What is blood glucose and how is blood glucose measured?  Blood glucose refers to the amount of glucose in your bloodstream. Glucose comes from eating foods that contain sugars and starches (carbohydrates) that the body breaks down into glucose. Your blood glucose level may be measured in mg/dL (milligrams per deciliter) or mmol/L (millimoles per liter).Your blood glucose may be checked with one or more of the following blood tests:  A fasting blood glucose (FBG) test. You will not be allowed to eat (you will fast) for at least 8 hours before a blood sample is taken. ? A normal range for FBG is 70-100 mg/dl (3.9-5.6 mmol/L).  An A1c (hemoglobin A1c) blood test. This test provides information about blood glucose control over the previous 2?33months.  An oral glucose tolerance test (OGTT). This test measures your blood glucose twice: ? After fasting. This is your baseline level. ? Two hours after you drink a  beverage that contains glucose.  You may be diagnosed with prediabetes:  If your FBG is 100?125 mg/dL (5.6-6.9 mmol/L).  If your A1c level is 5.7?6.4%.  If your OGGT result is 140?199 mg/dL (7.8-11 mmol/L).  These blood tests may be repeated to confirm your diagnosis. What happens if blood glucose is too high? The pancreas produces a hormone (insulin) that helps move glucose from the bloodstream into cells. When cells in the body do not respond properly to insulin that the body makes (insulin resistance), excess glucose builds up in the blood instead of going into cells. As a result, high blood glucose (hyperglycemia) can develop, which can cause many complications. This is a symptom of prediabetes. What can happen if blood glucose stays higher than normal for a long time? Having high blood glucose for a long time is dangerous. Too much glucose in your blood can damage your nerves and blood vessels. Long-term damage can lead to complications from diabetes, which may include:  Heart disease.  Stroke.  Blindness.  Kidney disease.  Depression.  Poor circulation in the feet and legs, which could lead to surgical removal (amputation) in severe cases.  How can prediabetes be prevented from turning into type 2 diabetes?  To help prevent type 2 diabetes, take the following actions:  Be physically active. ? Do moderate-intensity physical activity for at least 30 minutes on at least 5 days of the week, or as much as told by your health care provider. This could be brisk walking, biking, or water aerobics. ? Ask your health care provider what activities are  safe for you. A mix of physical activities may be best, such as walking, swimming, cycling, and strength training.  Lose weight as told by your health care provider. ? Losing 5-7% of your body weight can reverse insulin resistance. ? Your health care provider can determine how much weight loss is best for you and can help you lose  weight safely.  Follow a healthy meal plan. This includes eating lean proteins, complex carbohydrates, fresh fruits and vegetables, low-fat dairy products, and healthy fats. ? Follow instructions from your health care provider about eating or drinking restrictions. ? Make an appointment to see a diet and nutrition specialist (registered dietitian) to help you create a healthy eating plan that is right for you.  Do not smoke or use any tobacco products, such as cigarettes, chewing tobacco, and e-cigarettes. If you need help quitting, ask your health care provider.  Take over-the-counter and prescription medicines as told by your health care provider. You may be prescribed medicines that help lower the risk of type 2 diabetes.  This information is not intended to replace advice given to you by your health care provider. Make sure you discuss any questions you have with your health care provider. Document Released: 11/14/2015 Document Revised: 12/29/2015 Document Reviewed: 09/13/2015 Elsevier Interactive Patient Education  2018 [employer].   Shoulder Pain Many things can cause shoulder pain, including:  An injury to the area.  Overuse of the shoulder.  Arthritis.  The source of the pain can be:  Inflammation.  An injury to the shoulder joint.  An injury to a tendon, ligament, or bone.  Follow these instructions at home: Take these actions to help with your pain:  Squeeze a soft ball or a foam pad as much as possible. This helps to keep the shoulder from swelling. It also helps to strengthen the arm.  Take over-the-counter and prescription medicines only as told by your health care provider.  If directed, apply ice to the area: ? Put ice in a plastic bag. ? Place a towel between your skin and the bag. ? Leave the ice on for 20 minutes, 2-3 times per day. Stop applying ice if it does not help with the pain.  If you were given a shoulder sling or immobilizer: ? Wear it as  told. ? Remove it to shower or bathe. ? Move your arm as little as possible, but keep your hand moving to prevent swelling.  Contact a health care provider if:  Your pain gets worse.  Your pain is not relieved with medicines.  New pain develops in your arm, hand, or fingers. Get help right away if:  Your arm, hand, or fingers: ? Tingle. ? Become numb. ? Become swollen. ? Become painful. ? Turn white or blue. This information is not intended to replace advice given to you by your health care provider. Make sure you discuss any questions you have with your health care provider. Document Released: 05/02/2005 Document Revised: 03/18/2016 Document Reviewed: 11/15/2014 Elsevier Interactive Patient Education  2018 [employer].   Hip Pain The hip is the joint between the upper legs and the lower pelvis. The bones, cartilage, tendons, and muscles of your hip joint support your body and allow you to move around. Hip pain can range from a minor ache to severe pain in one or both of your hips. The pain may be felt on the inside of the hip joint near the groin, or the outside near the buttocks and upper thigh. You  may also have swelling or stiffness. Follow these instructions at home: Managing pain, stiffness, and swelling  If directed, apply ice to the injured area. ? Put ice in a plastic bag. ? Place a towel between your skin and the bag. ? Leave the ice on for 20 minutes, 2-3 times a day  Sleep with a pillow between your legs on your most comfortable side.  Avoid any activities that cause pain. General instructions  Take over-the-counter and prescription medicines only as told by your health care provider.  Do any exercises as told by your health care provider.  Record the following: ? How often you have hip pain. ? The location of your pain. ? What the pain feels like. ? What makes the pain worse.  Keep all follow-up visits as told by your health care provider. This is  important. Contact a health care provider if:  You cannot put weight on your leg.  Your pain or swelling continues or gets worse after one week.  It gets harder to walk.  You have a fever. Get help right away if:  You fall.  You have a sudden increase in pain and swelling in your hip.  Your hip is red or swollen or very tender to touch. Summary  Hip pain can range from a minor ache to severe pain in one or both of your hips.  The pain may be felt on the inside of the hip joint near the groin, or the outside near the buttocks and upper thigh.  Avoid any activities that cause pain.  Record how often you have hip pain, the location of the pain, what makes it worse and what it feels like. This information is not intended to replace advice given to you by your health care provider. Make sure you discuss any questions you have with your health care provider. Document Released: 01/10/2010 Document Revised: 06/25/2016 Document Reviewed: 06/25/2016 Elsevier Interactive Patient Education  2018 [employer].   IF you received an x-ray today, you will receive an invoice from [facility]. Please contact [facility] at [phone] with questions or concerns regarding your invoice.   IF you received labwork today, you will receive an invoice from [facility]. Please contact [facility] at [phone] with questions or concerns regarding your invoice.   Our billing staff will not be able to assist you with questions regarding bills from these companies.  You will be contacted with the lab results as soon as they are available. The fastest way to get your results is to activate your My Chart account. Instructions are located on the last page of this paperwork. If you have not heard from [place] regarding the results in 2 weeks, please contact this office.       I personally performed the services described in this documentation, which was scribed in my presence. The recorded  information has been reviewed and considered for accuracy and completeness, addended by me as needed, and agree with information above.  Signed,   Merri Ray, MD [facility].  12/25/17 10:09 PM

## 2017-12-25 NOTE — Patient Instructions (Addendum)
Restart blood pressure medicine at same dose for now. Keep a record of your blood pressures outside of the office and if running over 140/90 - let me know so we can look at dose changes.   It sounds like the falls are mechanical.  If you are having dizziness, or new balance issues return right away to discuss other evaluation.   You may have some rotator cuff syndrome or bursitis of the shoulders, and hip/leg symptoms could be related to wear and tear/arthritis of the back or hip.  Tylenol would likely be the safest initial approach.  Follow-up with me in the next month to discuss those areas further with possible x-rays.  Try tylenol for now.Return to the clinic or go to the nearest emergency room if any of your symptoms worsen or new symptoms occur.  Blood sugar test barely at level of prediabetes.  Exercise, watch diet, recheck levels in 3 to 6 months.  I have included some information on prediabetes below.  Thank you for coming in today.    Prediabetes Prediabetes is the condition of having a blood sugar (blood glucose) level that is higher than it should be, but not high enough for you to be diagnosed with type 2 diabetes. Having prediabetes puts you at risk for developing type 2 diabetes (type 2 diabetes mellitus). Prediabetes may be called impaired glucose tolerance or impaired fasting glucose. Prediabetes usually does not cause symptoms. Your health care provider can diagnose this condition with blood tests. You may be tested for prediabetes if you are overweight and if you have at least one other risk factor for prediabetes. Risk factors for prediabetes include:  Having a family member with type 2 diabetes.  Being overweight or obese.  Being older than age 39.  Being of American-Indian, African-American, Hispanic/Latino, or Asian/Pacific Islander descent.  Having an inactive (sedentary) lifestyle.  Having a history of gestational diabetes or polycystic ovarian syndrome  (PCOS).  Having low levels of good cholesterol (HDL-C) or high levels of blood fats (triglycerides).  Having high blood pressure.  What is blood glucose and how is blood glucose measured?  Blood glucose refers to the amount of glucose in your bloodstream. Glucose comes from eating foods that contain sugars and starches (carbohydrates) that the body breaks down into glucose. Your blood glucose level may be measured in mg/dL (milligrams per deciliter) or mmol/L (millimoles per liter).Your blood glucose may be checked with one or more of the following blood tests:  A fasting blood glucose (FBG) test. You will not be allowed to eat (you will fast) for at least 8 hours before a blood sample is taken. ? A normal range for FBG is 70-100 mg/dl (3.9-5.6 mmol/L).  An A1c (hemoglobin A1c) blood test. This test provides information about blood glucose control over the previous 2?32months.  An oral glucose tolerance test (OGTT). This test measures your blood glucose twice: ? After fasting. This is your baseline level. ? Two hours after you drink a beverage that contains glucose.  You may be diagnosed with prediabetes:  If your FBG is 100?125 mg/dL (5.6-6.9 mmol/L).  If your A1c level is 5.7?6.4%.  If your OGGT result is 140?199 mg/dL (7.8-11 mmol/L).  These blood tests may be repeated to confirm your diagnosis. What happens if blood glucose is too high? The pancreas produces a hormone (insulin) that helps move glucose from the bloodstream into cells. When cells in the body do not respond properly to insulin that the body makes (insulin resistance), excess  glucose builds up in the blood instead of going into cells. As a result, high blood glucose (hyperglycemia) can develop, which can cause many complications. This is a symptom of prediabetes. What can happen if blood glucose stays higher than normal for a long time? Having high blood glucose for a long time is dangerous. Too much glucose in your  blood can damage your nerves and blood vessels. Long-term damage can lead to complications from diabetes, which may include:  Heart disease.  Stroke.  Blindness.  Kidney disease.  Depression.  Poor circulation in the feet and legs, which could lead to surgical removal (amputation) in severe cases.  How can prediabetes be prevented from turning into type 2 diabetes?  To help prevent type 2 diabetes, take the following actions:  Be physically active. ? Do moderate-intensity physical activity for at least 30 minutes on at least 5 days of the week, or as much as told by your health care provider. This could be brisk walking, biking, or water aerobics. ? Ask your health care provider what activities are safe for you. A mix of physical activities may be best, such as walking, swimming, cycling, and strength training.  Lose weight as told by your health care provider. ? Losing 5-7% of your body weight can reverse insulin resistance. ? Your health care provider can determine how much weight loss is best for you and can help you lose weight safely.  Follow a healthy meal plan. This includes eating lean proteins, complex carbohydrates, fresh fruits and vegetables, low-fat dairy products, and healthy fats. ? Follow instructions from your health care provider about eating or drinking restrictions. ? Make an appointment to see a diet and nutrition specialist (registered dietitian) to help you create a healthy eating plan that is right for you.  Do not smoke or use any tobacco products, such as cigarettes, chewing tobacco, and e-cigarettes. If you need help quitting, ask your health care provider.  Take over-the-counter and prescription medicines as told by your health care provider. You may be prescribed medicines that help lower the risk of type 2 diabetes.  This information is not intended to replace advice given to you by your health care provider. Make sure you discuss any questions you have  with your health care provider. Document Released: 11/14/2015 Document Revised: 12/29/2015 Document Reviewed: 09/13/2015 Elsevier Interactive Patient Education  2018 [employer].   Shoulder Pain Many things can cause shoulder pain, including:  An injury to the area.  Overuse of the shoulder.  Arthritis.  The source of the pain can be:  Inflammation.  An injury to the shoulder joint.  An injury to a tendon, ligament, or bone.  Follow these instructions at home: Take these actions to help with your pain:  Squeeze a soft ball or a foam pad as much as possible. This helps to keep the shoulder from swelling. It also helps to strengthen the arm.  Take over-the-counter and prescription medicines only as told by your health care provider.  If directed, apply ice to the area: ? Put ice in a plastic bag. ? Place a towel between your skin and the bag. ? Leave the ice on for 20 minutes, 2-3 times per day. Stop applying ice if it does not help with the pain.  If you were given a shoulder sling or immobilizer: ? Wear it as told. ? Remove it to shower or bathe. ? Move your arm as little as possible, but keep your hand moving to prevent swelling.  Contact a health care provider if:  Your pain gets worse.  Your pain is not relieved with medicines.  New pain develops in your arm, hand, or fingers. Get help right away if:  Your arm, hand, or fingers: ? Tingle. ? Become numb. ? Become swollen. ? Become painful. ? Turn white or blue. This information is not intended to replace advice given to you by your health care provider. Make sure you discuss any questions you have with your health care provider. Document Released: 05/02/2005 Document Revised: 03/18/2016 Document Reviewed: 11/15/2014 Elsevier Interactive Patient Education  2018 [employer].   Hip Pain The hip is the joint between the upper legs and the lower pelvis. The bones, cartilage, tendons, and muscles of your  hip joint support your body and allow you to move around. Hip pain can range from a minor ache to severe pain in one or both of your hips. The pain may be felt on the inside of the hip joint near the groin, or the outside near the buttocks and upper thigh. You may also have swelling or stiffness. Follow these instructions at home: Managing pain, stiffness, and swelling  If directed, apply ice to the injured area. ? Put ice in a plastic bag. ? Place a towel between your skin and the bag. ? Leave the ice on for 20 minutes, 2-3 times a day  Sleep with a pillow between your legs on your most comfortable side.  Avoid any activities that cause pain. General instructions  Take over-the-counter and prescription medicines only as told by your health care provider.  Do any exercises as told by your health care provider.  Record the following: ? How often you have hip pain. ? The location of your pain. ? What the pain feels like. ? What makes the pain worse.  Keep all follow-up visits as told by your health care provider. This is important. Contact a health care provider if:  You cannot put weight on your leg.  Your pain or swelling continues or gets worse after one week.  It gets harder to walk.  You have a fever. Get help right away if:  You fall.  You have a sudden increase in pain and swelling in your hip.  Your hip is red or swollen or very tender to touch. Summary  Hip pain can range from a minor ache to severe pain in one or both of your hips.  The pain may be felt on the inside of the hip joint near the groin, or the outside near the buttocks and upper thigh.  Avoid any activities that cause pain.  Record how often you have hip pain, the location of the pain, what makes it worse and what it feels like. This information is not intended to replace advice given to you by your health care provider. Make sure you discuss any questions you have with your health care  provider. Document Released: 01/10/2010 Document Revised: 06/25/2016 Document Reviewed: 06/25/2016 Elsevier Interactive Patient Education  2018 [employer].   IF you received an x-ray today, you will receive an invoice from [facility]. Please contact [facility] at [phone] with questions or concerns regarding your invoice.   IF you received labwork today, you will receive an invoice from [facility]. Please contact [facility] at [phone] with questions or concerns regarding your invoice.   Our billing staff will not be able to assist you with questions regarding bills from these companies.  You will be contacted with the lab  results as soon as they are available. The fastest way to get your results is to activate your My Chart account. Instructions are located on the last page of this paperwork. If you have not heard from [place] regarding the results in 2 weeks, please contact this office.

## 2017-12-26 LAB — COMPREHENSIVE METABOLIC PANEL
ALBUMIN: 4.3 g/dL (ref 3.5–5.5)
ALK PHOS: 60 IU/L (ref 39–117)
ALT: 43 IU/L — ABNORMAL HIGH (ref 0–32)
AST: 27 IU/L (ref 0–40)
Albumin/Globulin Ratio: 1.5 (ref 1.2–2.2)
BILIRUBIN TOTAL: 0.5 mg/dL (ref 0.0–1.2)
BUN / CREAT RATIO: 17 (ref 9–23)
BUN: 16 mg/dL (ref 6–24)
CO2: 21 mmol/L (ref 20–29)
CREATININE: 0.93 mg/dL (ref 0.57–1.00)
Calcium: 9.5 mg/dL (ref 8.7–10.2)
Chloride: 105 mmol/L (ref 96–106)
GFR calc Af Amer: 79 mL/min/{1.73_m2} (ref >59)
GFR calc non Af Amer: 69 mL/min/{1.73_m2} (ref >59)
GLUCOSE: 106 mg/dL — AB (ref 65–99)
Globulin, Total: 2.8 g/dL (ref 1.5–4.5)
Potassium: 4.6 mmol/L (ref 3.5–5.2)
SODIUM: 141 mmol/L (ref 134–144)
Total Protein: 7.1 g/dL (ref 6.0–8.5)

## 2017-12-26 LAB — TSH: TSH: 6.51 u[IU]/mL — ABNORMAL HIGH (ref 0.450–4.500)

## 2017-12-26 LAB — LIPID PANEL
Chol/HDL Ratio: 4.6 ratio — ABNORMAL HIGH (ref 0.0–4.4)
Cholesterol, Total: 241 mg/dL — ABNORMAL HIGH (ref 100–199)
HDL: 52 mg/dL (ref >39)
LDL Calculated: 165 mg/dL — ABNORMAL HIGH (ref 0–99)
Triglycerides: 120 mg/dL (ref 0–149)
VLDL Cholesterol Cal: 24 mg/dL (ref 5–40)

## 2018-01-13 ENCOUNTER — Encounter: Payer: Self-pay | Admitting: *Deleted

## 2018-01-28 ENCOUNTER — Ambulatory Visit (INDEPENDENT_AMBULATORY_CARE_PROVIDER_SITE_OTHER): Payer: BLUE CROSS/BLUE SHIELD | Admitting: Family Medicine

## 2018-01-28 ENCOUNTER — Other Ambulatory Visit: Payer: Self-pay

## 2018-01-28 ENCOUNTER — Encounter: Payer: Self-pay | Admitting: Family Medicine

## 2018-01-28 VITALS — BP 104/76 | HR 75 | Temp 98.0°F | Ht 66.5 in | Wt 227.0 lb

## 2018-01-28 DIAGNOSIS — E785 Hyperlipidemia, unspecified: Secondary | ICD-10-CM

## 2018-01-28 DIAGNOSIS — E039 Hypothyroidism, unspecified: Secondary | ICD-10-CM

## 2018-01-28 DIAGNOSIS — I10 Essential (primary) hypertension: Secondary | ICD-10-CM

## 2018-01-28 DIAGNOSIS — R7303 Prediabetes: Secondary | ICD-10-CM

## 2018-01-28 NOTE — Progress Notes (Signed)
Subjective:  By signing my name below, I, Essence Howell, attest that this documentation has been prepared under the direction and in the presence of Wendie Agreste, MD Electronically Signed: Ladene Artist, ED Scribe 01/28/2018 at 10:02 AM.   Patient ID: Christine [name], female    DOB: [birth year], 57 y.o.   MRN: [id number]  Chief Complaint  Patient presents with  . Chonic Condition    follow up (doing better with hip and shoulder)   HPI Christine [name] is a 57 y.o. female who presents to [facility] Care at [facility] for f/u. Last seen 5/22. Multiple concerns addressed at that time.  HTN Out of meds x 1 wk when seen in May. Restarted HCTZ at 12.5 mg qd. - Pt does not check her BP outside of the office. BP Readings from Last 3 Encounters:  01/28/18 140/80  12/25/17 (!) 142/88  04/16/17 127/79   Pre-DM Lab Results  Component Value Date   HGBA1C 5.7 (A) 12/25/2017   Wt Readings from Last 3 Encounters:  01/28/18 227 lb (103 kg)  12/25/17 228 lb 3.2 oz (103.5 kg)  04/16/17 225 lb (102.1 kg)  Handout was given and diet changes discussed. - States she has been exercising a little more and trying to change her diet some. Reports using less dressing on her salads by dipping instead and making healthier choices when she goes out to eat. Pt describes herself as a "vegan-wannabe".  Hypothyroidism Lab Results  Component Value Date   TSH 6.510 (H) 12/25/2017  Continued same dose of synthroid with option of recheck in 6 wks vs option of higher dose.  Hyperlipidemia Lab Results  Component Value Date   CHOL 241 (H) 12/25/2017   HDL 52 12/25/2017   LDLCALC 165 (H) 12/25/2017   TRIG 120 12/25/2017   CHOLHDL 4.6 (H) 12/25/2017   Lab Results  Component Value Date   ALT 43 (H) 12/25/2017   AST 27 12/25/2017   ALKPHOS 60 12/25/2017   BILITOT 0.5 12/25/2017   The 10-year ASCVD risk score Mikey Bussing DC Jr., et al., 2013) is: 4.5%   Values used to calculate the score:     Age: 10 years     Sex:  Female     Is Non-Hispanic African American: No     Diabetic: No     Tobacco smoker: No     Systolic Blood Pressure: 878 mmHg     Is BP treated: Yes     HDL Cholesterol: 52 mg/dL     Total Cholesterol: 241 mg/dL Discussed initial diet and exercise approach, rpt in 6 months. LDL had increased from 126 to 165 over past 2 yrs.  Hip and Bilateral Shoulder Pain H/o prior fall. Hip exam was reassuring, suspected lumbar source and possible rotator cuff syndrome vs bursitis with initial approach of ROM and Tylenol. - Pt reports that she is still having intermittent pain, improved overall. Plans to f/u with chiropractor for XRs. She has not needed Tylenol much.  Patient Active Problem List   Diagnosis Date Noted  . Essential hypertension 09/05/2015  . Hypothyroid 07/02/2012  . Anxiety 07/02/2012  . BMI 31.0-31.9,adult 07/02/2012   Past Medical History:  Diagnosis Date  . Hypertension    No past surgical history on file. Allergies  Allergen Reactions  . Codeine    Prior to Admission medications   Medication Sig Start Date End Date Taking? Authorizing Provider  hydrochlorothiazide (MICROZIDE) 12.5 MG capsule Take 1 capsule (12.5 mg total) by mouth  daily. 12/25/17   Wendie Agreste, MD  levothyroxine (SYNTHROID, LEVOTHROID) 150 MCG tablet Take 1 tablet (150 mcg total) by mouth daily. 12/25/17   Wendie Agreste, MD  PARoxetine (PAXIL) 20 MG tablet Take 1 tablet (20 mg total) by mouth daily. 12/25/17   Wendie Agreste, MD   Social History   Socioeconomic History  . Marital status: Widowed    Spouse name: Not on file  . Number of children: Not on file  . Years of education: Not on file  . Highest education level: Not on file  Occupational History  . Not on file  Social Needs  . Financial resource strain: Not on file  . Food insecurity:    Worry: Not on file    Inability: Not on file  . Transportation needs:    Medical: Not on file    Non-medical: Not on file  Tobacco Use  .  Smoking status: Never Smoker  . Smokeless tobacco: Never Used  Substance and Sexual Activity  . Alcohol use: No  . Drug use: No  . Sexual activity: Never  Lifestyle  . Physical activity:    Days per week: Not on file    Minutes per session: Not on file  . Stress: Not on file  Relationships  . Social connections:    Talks on phone: Not on file    Gets together: Not on file    Attends religious service: Not on file    Active member of club or organization: Not on file    Attends meetings of clubs or organizations: Not on file    Relationship status: Not on file  . Intimate partner violence:    Fear of current or ex partner: Not on file    Emotionally abused: Not on file    Physically abused: Not on file    Forced sexual activity: Not on file  Other Topics Concern  . Not on file  Social History Narrative  . Not on file   Review of Systems  Constitutional: Negative for fatigue and unexpected weight change.  Respiratory: Negative for chest tightness and shortness of breath.   Cardiovascular: Negative for chest pain, palpitations and leg swelling.  Gastrointestinal: Negative for abdominal pain and blood in stool.  Musculoskeletal: Positive for arthralgias.  Neurological: Negative for dizziness, syncope, light-headedness and headaches.      Objective:   Physical Exam  Constitutional: She is oriented to person, place, and time. She appears well-developed and well-nourished.  HENT:  Head: Normocephalic and atraumatic.  Eyes: Pupils are equal, round, and reactive to light. Conjunctivae and EOM are normal.  Neck: Carotid bruit is not present.  Cardiovascular: Normal rate, regular rhythm, normal heart sounds and intact distal pulses.  Pulmonary/Chest: Effort normal and breath sounds normal.  Abdominal: Soft. She exhibits no pulsatile midline mass. There is no tenderness.  Musculoskeletal:  Slight stiffness with empty can on L. Full RTC strength. Neg hawkins. Neg Neer's.    Neurological: She is alert and oriented to person, place, and time.  Skin: Skin is warm and dry.  Psychiatric: She has a normal mood and affect. Her behavior is normal.  Vitals reviewed.  Vitals:   01/28/18 0947  BP: 140/80  Pulse: 75  Temp: 98 F (36.7 C)  TempSrc: Oral  SpO2: 96%  Weight: 227 lb (103 kg)  Height: 5' 6.5" (1.689 m)      Assessment & Plan:  Christine [name] is a 56 y.o. female Hypothyroidism, unspecified type -  Plan: TSH + free T4  -Borderline elevated TSH at last visit.  Plans to return in the next 1 months for lab only visit to determine if changes needed.  Continue same dose Synthroid for now.  Essential hypertension  -Borderline, but expect this to also improve now that she is back on HCTZ and with diet/exercise changes for weight loss.  No med changes for now.  Can monitor her readings at home and update me by my chart.  Prediabetes  -Diet/exercise for weight loss as above with repeat testing in 6 months  Hyperlipidemia, unspecified hyperlipidemia type  -Elevated, but not at level for recommendation of statin based on ASCVD risk assessment.  Diet/exercise approach initially with repeat testing in 6 months  Shoulder pain, hip/back pain has improved.  Follow-up as needed if those areas recur.  Reassuring exam at present.  No orders of the defined types were placed in this encounter.  Patient Instructions   I'm glad to hear that the shoulders and hips are improved. Let me know if that is not continuing to improve.   For cholesterol and blood sugar issue, I think it is reasonable to work on diet and activity. Keep up the good work with diet and activity/exercise as tolerated and we can repeat labs in 6 months.   In 1 month - return for lab only visit for thyroid testing.    IF you received an x-ray today, you will receive an invoice from [facility]. Please contact [facility] at [phone] with questions or concerns regarding your  invoice.   IF you received labwork today, you will receive an invoice from [facility]. Please contact [facility] at [phone] with questions or concerns regarding your invoice.   Our billing staff will not be able to assist you with questions regarding bills from these companies.  You will be contacted with the lab results as soon as they are available. The fastest way to get your results is to activate your My Chart account. Instructions are located on the last page of this paperwork. If you have not heard from [place] regarding the results in 2 weeks, please contact this office.       I personally performed the services described in this documentation, which was scribed in my presence. The recorded information has been reviewed and considered for accuracy and completeness, addended by me as needed, and agree with information above.  Signed,   Merri Ray, MD [facility].  01/28/18 10:21 AM

## 2018-01-28 NOTE — Patient Instructions (Addendum)
I'm glad to hear that the shoulders and hips are improved. Let me know if that is not continuing to improve.   For cholesterol and blood sugar issue, I think it is reasonable to work on diet and activity. Keep up the good work with diet and activity/exercise as tolerated and we can repeat labs in 6 months.   In 1 month - return for lab only visit for thyroid testing.    IF you received an x-ray today, you will receive an invoice from [facility]. Please contact [facility] at [phone] with questions or concerns regarding your invoice.   IF you received labwork today, you will receive an invoice from [facility]. Please contact [facility] at [phone] with questions or concerns regarding your invoice.   Our billing staff will not be able to assist you with questions regarding bills from these companies.  You will be contacted with the lab results as soon as they are available. The fastest way to get your results is to activate your My Chart account. Instructions are located on the last page of this paperwork. If you have not heard from [place] regarding the results in 2 weeks, please contact this office.

## 2018-06-24 ENCOUNTER — Other Ambulatory Visit: Payer: Self-pay | Admitting: Family Medicine

## 2018-06-24 DIAGNOSIS — I10 Essential (primary) hypertension: Secondary | ICD-10-CM

## 2018-06-24 DIAGNOSIS — E039 Hypothyroidism, unspecified: Secondary | ICD-10-CM

## 2018-06-24 DIAGNOSIS — F410 Panic disorder [episodic paroxysmal anxiety] without agoraphobia: Secondary | ICD-10-CM

## 2018-06-25 NOTE — Telephone Encounter (Signed)
Patient has follow up appointment scheduled 08/04/18. Courtesy refill given. Per chart note patient wanted to continue present dosing of levothyroxine- she did not return for follow up lab- still outstanding. Requested Prescriptions  Pending Prescriptions Disp Refills  . hydrochlorothiazide (MICROZIDE) 12.5 MG capsule [Pharmacy Med Name: HYDROCHLOROTHIAZIDE 12.5 MG CP] 90 capsule 0    Sig: TAKE 1 CAPSULE BY MOUTH EVERY DAY     Cardiovascular: Diuretics - Thiazide Passed - 06/24/2018  2:22 AM      Passed - Ca in normal range and within 360 days    Calcium  Date Value Ref Range Status  12/25/2017 9.5 8.7 - 10.2 mg/dL Final         Passed - Cr in normal range and within 360 days    Creat  Date Value Ref Range Status  12/13/2015 0.76 0.50 - 1.05 mg/dL Final   Creatinine, Ser  Date Value Ref Range Status  12/25/2017 0.93 0.57 - 1.00 mg/dL Final         Passed - K in normal range and within 360 days    Potassium  Date Value Ref Range Status  12/25/2017 4.6 3.5 - 5.2 mmol/L Final         Passed - Na in normal range and within 360 days    Sodium  Date Value Ref Range Status  12/25/2017 141 134 - 144 mmol/L Final         Passed - Last BP in normal range    BP Readings from Last 1 Encounters:  01/28/18 104/76         Passed - Valid encounter within last 6 months    Recent Outpatient Visits          4 months ago Hypothyroidism, unspecified type   [facility], Ranell Patrick, MD   6 months ago Hyperglycemia   [facility], Ranell Patrick, MD   1 year ago Essential hypertension   [facility], Ranell Patrick, MD   2 years ago Hypothyroidism, unspecified hypothyroidism type   [facility] at [facility], Linton Ham, MD   2 years ago Hypothyroidism, unspecified hypothyroidism type   [facility] at [facility], Linton Ham, MD      Future Appointments            In 1 month Carlota Raspberry Ranell Patrick, MD [facility] at [place], [facility]         . levothyroxine (SYNTHROID, LEVOTHROID) 150 MCG tablet [Pharmacy Med Name: LEVOTHYROXINE 150 MCG TABLET] 90 tablet 0    Sig: TAKE 1 TABLET BY MOUTH EVERY DAY     Endocrinology:  Hypothyroid Agents Failed - 06/24/2018  2:22 AM      Failed - TSH needs to be rechecked within 3 months after an abnormal result. Refill until TSH is due.      Failed - TSH in normal range and within 360 days    TSH  Date Value Ref Range Status  12/25/2017 6.510 (H) 0.450 - 4.500 uIU/mL Final         Passed - Valid encounter within last 12 months    Recent Outpatient Visits          4 months ago Hypothyroidism, unspecified type   [facility], Ranell Patrick, MD   6 months ago Hyperglycemia   [facility], Ranell Patrick, MD   1 year ago Essential hypertension   [facility], Harwood  R, MD   2 years ago Hypothyroidism, unspecified hypothyroidism type   [facility] at [facility], Linton Ham, MD   2 years ago Hypothyroidism, unspecified hypothyroidism type   [facility] at [facility], Linton Ham, MD      Future Appointments            In 1 month Carlota Raspberry Ranell Patrick, MD Primary [facility] at [place], [facility]         . PARoxetine (PAXIL) 20 MG tablet [Pharmacy Med Name: PAROXETINE HCL 20 MG TABLET] 90 tablet 0    Sig: TAKE 1 TABLET BY MOUTH EVERY DAY     Psychiatry:  Antidepressants - SSRI Passed - 06/24/2018  2:22 AM      Passed - Valid encounter within last 6 months    Recent Outpatient Visits          4 months ago Hypothyroidism, unspecified type   [facility], Ranell Patrick, MD   6 months ago Hyperglycemia   [facility], Ranell Patrick, MD   1 year ago Essential hypertension   [facility], Ranell Patrick, MD   2 years ago Hypothyroidism, unspecified hypothyroidism type   Primary [facility] at [facility], Linton Ham, MD   2 years ago Hypothyroidism, unspecified hypothyroidism type   Primary Care at [facility], Linton Ham, MD      Future Appointments            In 1 month Carlota Raspberry Ranell Patrick, MD Primary Care at [place], [facility]

## 2018-08-04 ENCOUNTER — Ambulatory Visit: Payer: BLUE CROSS/BLUE SHIELD | Admitting: Family Medicine

## 2018-08-07 ENCOUNTER — Ambulatory Visit (INDEPENDENT_AMBULATORY_CARE_PROVIDER_SITE_OTHER): Payer: BLUE CROSS/BLUE SHIELD | Admitting: Family Medicine

## 2018-08-07 ENCOUNTER — Other Ambulatory Visit: Payer: Self-pay

## 2018-08-07 ENCOUNTER — Encounter: Payer: Self-pay | Admitting: Family Medicine

## 2018-08-07 VITALS — BP 148/94 | HR 80 | Temp 98.6°F | Ht 66.0 in | Wt 230.0 lb

## 2018-08-07 DIAGNOSIS — E785 Hyperlipidemia, unspecified: Secondary | ICD-10-CM | POA: Diagnosis not present

## 2018-08-07 DIAGNOSIS — I10 Essential (primary) hypertension: Secondary | ICD-10-CM | POA: Diagnosis not present

## 2018-08-07 DIAGNOSIS — E039 Hypothyroidism, unspecified: Secondary | ICD-10-CM | POA: Diagnosis not present

## 2018-08-07 DIAGNOSIS — F410 Panic disorder [episodic paroxysmal anxiety] without agoraphobia: Secondary | ICD-10-CM

## 2018-08-07 DIAGNOSIS — R221 Localized swelling, mass and lump, neck: Secondary | ICD-10-CM

## 2018-08-07 DIAGNOSIS — R739 Hyperglycemia, unspecified: Secondary | ICD-10-CM | POA: Diagnosis not present

## 2018-08-07 MED ORDER — LEVOTHYROXINE SODIUM 150 MCG PO TABS: 150.0000 ug | ORAL_TABLET | Freq: Every day | ORAL | 1 refills | Status: DC

## 2018-08-07 MED ORDER — PAROXETINE HCL 20 MG PO TABS: 20.0000 mg | ORAL_TABLET | Freq: Every day | ORAL | 1 refills | Status: DC

## 2018-08-07 MED ORDER — HYDROCHLOROTHIAZIDE 12.5 MG PO CAPS: ORAL_CAPSULE | ORAL | 1 refills | Status: DC

## 2018-08-07 NOTE — Progress Notes (Signed)
Subjective:    Patient ID: Christine [name], female    DOB: [birth year], 58 y.o.   MRN: [id number]  HPI Christine [name] is a 58 y.o. female Presents today for: Chief Complaint  Patient presents with  . Diabetes  . Hyperglycemia    6 m f/u   . Hypothyroidism  . blad spot  . neck pulling feeling    on and off.    Hypertension: BP Readings from Last 3 Encounters:  08/07/18 (!) 156/86  01/28/18 104/76  12/25/17 (!) 142/88   Lab Results  Component Value Date   CREATININE 0.93 12/25/2017  Currently on HCTZ 12.5 mg daily.   Possible one missed dose in past 2 weeks. No new side effects of meds. No regular dizziness, no chest pains.   Anxiety: Controlled on Paxil. No new side effects.   Hyperlipidemia:  Lab Results  Component Value Date   CHOL 241 (H) 12/25/2017   HDL 52 12/25/2017   LDLCALC 165 (H) 12/25/2017   TRIG 120 12/25/2017   CHOLHDL 4.6 (H) 12/25/2017   Lab Results  Component Value Date   ALT 43 (H) 12/25/2017   AST 27 12/25/2017   ALKPHOS 60 12/25/2017   BILITOT 0.5 12/25/2017   The 10-year ASCVD risk score Mikey Bussing DC Jr., et al., 2013) is: 5.4%   Values used to calculate the score:     Age: 15 years     Sex: Female     Is Non-Hispanic African American: No     Diabetic: No     Tobacco smoker: No     Systolic Blood Pressure: 349 mmHg     Is BP treated: Yes     HDL Cholesterol: 52 mg/dL     Total Cholesterol: 241 mg/dL   Hypothyroidism: Lab Results  Component Value Date   TSH 6.510 (H) 12/25/2017  Currently on Synthroid 150 mcg daily.  With mildly elevated TSH last visit in June option of slight higher dose of Synthroid was discussed. Has not been seen since that time or changes in meds.   Elevated LFT: Lab Results  Component Value Date   ALT 43 (H) 12/25/2017   AST 27 12/25/2017   ALKPHOS 60 12/25/2017   BILITOT 0.5 12/25/2017  Recommended recheck in a few months.  Hyperglycemia: Glucose 106 in May. Decreased diet adherence over the holidays. No  regular exercise at present.  Wt Readings from Last 3 Encounters:  08/07/18 230 lb (104.3 kg)  01/28/18 227 lb (103 kg)  12/25/17 228 lb 3.2 oz (103.5 kg)  Body mass index is 37.12 kg/m.  Bald spot: Noted past month. Slight itch. Some stress past few years with family, but improved now. Not coloring hair anymore. Occasional purple shampoo - not daily. Has worn hat frequently and strap hits that area.   Neck discomfort: Pulling sensation in R neck- occasional past few years. Seems more lately - past few months. No rash, no slurred speech. No ear pain or jaw pain.     Patient Active Problem List   Diagnosis Date Noted  . Essential hypertension 09/05/2015  . Hypothyroid 07/02/2012  . Anxiety 07/02/2012  . BMI 31.0-31.9,adult 07/02/2012   Past Medical History:  Diagnosis Date  . Hypertension    No past surgical history on file. Allergies  Allergen Reactions  . Codeine    Prior to Admission medications   Medication Sig Start Date End Date Taking? Authorizing Provider  hydrochlorothiazide (MICROZIDE) 12.5 MG capsule TAKE 1 CAPSULE BY MOUTH EVERY  DAY 06/25/18  Yes Wendie Agreste, MD  levothyroxine (SYNTHROID, LEVOTHROID) 150 MCG tablet TAKE 1 TABLET BY MOUTH EVERY DAY 06/25/18  Yes Wendie Agreste, MD  PARoxetine (PAXIL) 20 MG tablet TAKE 1 TABLET BY MOUTH EVERY DAY 06/25/18  Yes Wendie Agreste, MD   Social History   Socioeconomic History  . Marital status: Widowed    Spouse name: Not on file  . Number of children: Not on file  . Years of education: Not on file  . Highest education level: Not on file  Occupational History  . Not on file  Social Needs  . Financial resource strain: Not on file  . Food insecurity:    Worry: Not on file    Inability: Not on file  . Transportation needs:    Medical: Not on file    Non-medical: Not on file  Tobacco Use  . Smoking status: Never Smoker  . Smokeless tobacco: Never Used  Substance and Sexual Activity  . Alcohol use:  No  . Drug use: No  . Sexual activity: Never  Lifestyle  . Physical activity:    Days per week: Not on file    Minutes per session: Not on file  . Stress: Not on file  Relationships  . Social connections:    Talks on phone: Not on file    Gets together: Not on file    Attends religious service: Not on file    Active member of club or organization: Not on file    Attends meetings of clubs or organizations: Not on file    Relationship status: Not on file  . Intimate partner violence:    Fear of current or ex partner: Not on file    Emotionally abused: Not on file    Physically abused: Not on file    Forced sexual activity: Not on file  Other Topics Concern  . Not on file  Social History Narrative  . Not on file     Review of Systems Per HPI.     Objective:   Physical Exam Vitals signs reviewed.  Constitutional:      Appearance: She is well-developed.  HENT:     Head: Normocephalic and atraumatic.   Eyes:     Conjunctiva/sclera: Conjunctivae normal.     Pupils: Pupils are equal, [name], and reactive to light.  Neck:     Musculoskeletal: Normal range of motion.     Thyroid: No thyroid mass, thyromegaly or thyroid tenderness.     Vascular: No carotid bruit.  Cardiovascular:     Rate and Rhythm: Normal rate and regular rhythm.     Heart sounds: Normal heart sounds.  Pulmonary:     Effort: Pulmonary effort is normal.     Breath sounds: Normal breath sounds.  Abdominal:     Palpations: Abdomen is soft. There is no pulsatile mass.     Tenderness: There is no abdominal tenderness.  Lymphadenopathy:     Cervical: No cervical adenopathy.  Skin:    General: Skin is warm and dry.  Neurological:     Mental Status: She is alert and oriented to person, place, and time.  Psychiatric:        Behavior: Behavior normal.    Vitals:   08/07/18 0838 08/07/18 0844  BP: (!) 156/86 (!) 148/82  Pulse: 80   Temp: 98.6 F (37 C)   TempSrc: Oral   SpO2: 96%   Weight: 230 lb  (104.3 kg)   Height: 5'  6" (1.676 m)       Assessment & Plan:   Christine [name] is a 58 y.o. female Hyperlipidemia, unspecified hyperlipidemia type - Plan: Lipid panel, Comprehensive metabolic panel  -Check lipids, discussed exercise and diet changes for weight loss.  Hyperglycemia - Plan: Hemoglobin A1c  -Screen for diabetes with A1c, weight loss with diet and exercise discussed.  Pool-based exercise may be easier with previous orthopedic issues.  Hypothyroidism, unspecified type - Plan: TSH + free T4, levothyroxine (SYNTHROID, LEVOTHROID) 150 MCG tablet  -Repeat labs given prior elevated TSH.  Multiple on the back of her head may be from use of had previously as this appears to be in the area of the strap/clasp.  Plan to follow-up in the next few weeks to discuss further once results of labs reviewed.  Essential hypertension - Plan: hydrochlorothiazide (MICROZIDE) 12.5 MG capsule  -Elevated in office.  Advised to check at home, follow-up in 3 to 4 weeks.  Continue same regimen for now.  Neck fullness - Plan: US Soft Tissue Head/Neck  -I do not appreciate significant thyroid enlargement or nodules but her area of fullness is anterior to the SCM muscle.  Will check a neck ultrasound and follow-up to discuss further in few weeks.  Panic attacks - Plan: PARoxetine (PAXIL) 20 MG tablet  -Stable, no med changes.  Meds ordered this encounter  Medications  . hydrochlorothiazide (MICROZIDE) 12.5 MG capsule    Sig: TAKE 1 CAPSULE BY MOUTH EVERY DAY    Dispense:  90 capsule    Refill:  1  . levothyroxine (SYNTHROID, LEVOTHROID) 150 MCG tablet    Sig: Take 1 tablet (150 mcg total) by mouth daily.    Dispense:  90 tablet    Refill:  1  . PARoxetine (PAXIL) 20 MG tablet    Sig: Take 1 tablet (20 mg total) by mouth daily.    Dispense:  90 tablet    Refill:  1   Patient Instructions   I will check some lab work today, but please follow-up in the next 3 to 4 weeks to discuss spot on scalp and  neck fullness further.  We should have ultrasound results by that time as well.    Cholesterol was elevated last visit, I will check that today.  Some form of exercise such as walking most days per week and watch diet for weight management.   Blood pressure also borderline elevated today.  Check that at home or outside of the office and if readings over 140/90, we need to consider change in medications.  Can discuss this again in the next few weeks.  Return to the clinic or go to the nearest emergency room if any of your symptoms worsen or new symptoms occur.    If you have lab work done today you will be contacted with your lab results within the next 2 weeks.  If you have not heard from [place] then please contact us. The fastest way to get your results is to register for My Chart.   IF you received an x-ray today, you will receive an invoice from [facility]. Please contact [facility] at [phone] with questions or concerns regarding your invoice.   IF you received labwork today, you will receive an invoice from [facility]. Please contact [facility] at [phone] with questions or concerns regarding your invoice.   Our billing staff will not be able to assist you with questions regarding bills from these companies.  You will be contacted with  the lab results as soon as they are available. The fastest way to get your results is to activate your My Chart account. Instructions are located on the last page of this paperwork. If you have not heard from [place] regarding the results in 2 weeks, please contact this office.       Signed,   Merri Ray, MD Primary Care at [place].  08/07/18 9:06 AM

## 2018-08-07 NOTE — Patient Instructions (Addendum)
I will check some lab work today, but please follow-up in the next 3 to 4 weeks to discuss spot on scalp and neck fullness further.  We should have ultrasound results by that time as well.    Cholesterol was elevated last visit, I will check that today.  Some form of exercise such as walking most days per week and watch diet for weight management.   Blood pressure also borderline elevated today.  Check that at home or outside of the office and if readings over 140/90, we need to consider change in medications.  Can discuss this again in the next few weeks.  Return to the clinic or go to the nearest emergency room if any of your symptoms worsen or new symptoms occur.    If you have lab work done today you will be contacted with your lab results within the next 2 weeks.  If you have not heard from [place] then please contact us. The fastest way to get your results is to register for My Chart.   IF you received an x-ray today, you will receive an invoice from [facility]. Please contact [facility] at [phone] with questions or concerns regarding your invoice.   IF you received labwork today, you will receive an invoice from [place]. Please contact [facility] at [phone] with questions or concerns regarding your invoice.   Our billing staff will not be able to assist you with questions regarding bills from these companies.  You will be contacted with the lab results as soon as they are available. The fastest way to get your results is to activate your My Chart account. Instructions are located on the last page of this paperwork. If you have not heard from [place] regarding the results in 2 weeks, please contact this office.

## 2018-08-08 LAB — COMPREHENSIVE METABOLIC PANEL
ALT: 50 IU/L — ABNORMAL HIGH (ref 0–32)
AST: 23 IU/L (ref 0–40)
Albumin/Globulin Ratio: 1.6 (ref 1.2–2.2)
Albumin: 4.4 g/dL (ref 3.5–5.5)
Alkaline Phosphatase: 64 IU/L (ref 39–117)
BUN/Creatinine Ratio: 16 (ref 9–23)
BUN: 15 mg/dL (ref 6–24)
Bilirubin Total: 0.5 mg/dL (ref 0.0–1.2)
CO2: 22 mmol/L (ref 20–29)
Calcium: 9.2 mg/dL (ref 8.7–10.2)
Chloride: 100 mmol/L (ref 96–106)
Creatinine, Ser: 0.95 mg/dL (ref 0.57–1.00)
GFR calc Af Amer: 77 mL/min/{1.73_m2} (ref >59)
GFR calc non Af Amer: 67 mL/min/{1.73_m2} (ref >59)
Globulin, Total: 2.7 g/dL (ref 1.5–4.5)
Glucose: 111 mg/dL — ABNORMAL HIGH (ref 65–99)
Potassium: 4.2 mmol/L (ref 3.5–5.2)
Sodium: 138 mmol/L (ref 134–144)
Total Protein: 7.1 g/dL (ref 6.0–8.5)

## 2018-08-08 LAB — LIPID PANEL
Chol/HDL Ratio: 4.5 ratio — ABNORMAL HIGH (ref 0.0–4.4)
Cholesterol, Total: 254 mg/dL — ABNORMAL HIGH (ref 100–199)
HDL: 56 mg/dL (ref >39)
LDL Calculated: 171 mg/dL — ABNORMAL HIGH (ref 0–99)
Triglycerides: 136 mg/dL (ref 0–149)
VLDL Cholesterol Cal: 27 mg/dL (ref 5–40)

## 2018-08-08 LAB — HEMOGLOBIN A1C
Est. average glucose Bld gHb Est-mCnc: 120 mg/dL
Hgb A1c MFr Bld: 5.8 % — ABNORMAL HIGH (ref 4.8–5.6)

## 2018-08-08 LAB — TSH+FREE T4
Free T4: 1.28 ng/dL (ref 0.82–1.77)
TSH: 0.815 u[IU]/mL (ref 0.450–4.500)

## 2018-09-03 ENCOUNTER — Other Ambulatory Visit: Payer: Self-pay

## 2018-09-03 ENCOUNTER — Ambulatory Visit: Payer: BLUE CROSS/BLUE SHIELD | Admitting: Family Medicine

## 2018-09-03 ENCOUNTER — Encounter: Payer: Self-pay | Admitting: Family Medicine

## 2018-09-03 VITALS — BP 130/76 | HR 69 | Temp 97.6°F | Ht 66.0 in | Wt 233.2 lb

## 2018-09-03 DIAGNOSIS — J329 Chronic sinusitis, unspecified: Secondary | ICD-10-CM

## 2018-09-03 DIAGNOSIS — K220 Achalasia of cardia: Secondary | ICD-10-CM | POA: Diagnosis not present

## 2018-09-03 DIAGNOSIS — I10 Essential (primary) hypertension: Secondary | ICD-10-CM

## 2018-09-03 DIAGNOSIS — R12 Heartburn: Secondary | ICD-10-CM

## 2018-09-03 DIAGNOSIS — M542 Cervicalgia: Secondary | ICD-10-CM

## 2018-09-03 MED ORDER — OMEPRAZOLE 20 MG PO CPDR: 20.0000 mg | DELAYED_RELEASE_CAPSULE | Freq: Every day | ORAL | 1 refills | Status: DC

## 2018-09-03 MED ORDER — AMOXICILLIN-POT CLAVULANATE 875-125 MG PO TABS: 1.0000 | ORAL_TABLET | Freq: Two times a day (BID) | ORAL | 0 refills | Status: DC

## 2018-09-03 NOTE — Progress Notes (Signed)
Subjective:    Patient ID: Christine [name], female    DOB: 06/30/1961, 58 y.o.   MRN: [id number]  HPI Christine [name] is a 58 y.o. female Presents today for: Chief Complaint  Patient presents with  . Neck Pain    F/u  . Hypertension    F/u  & Blood work results  . Sinusitis    has had for over a week and can talk about it secondary    Hypertension: BP Readings from Last 3 Encounters:  09/03/18 130/76  08/07/18 (!) 148/94  01/28/18 104/76   Lab Results  Component Value Date   CREATININE 0.95 08/07/2018  Currently on HCTZ 12.5 mg.  Borderline elevated last visit, improved today.  Neck pain/fullness: History of hypothyroidism, on medications.  Described a pulling sensation her right neck was occasional over the prior few years but increased symptoms recently when discussed earlier this month.  Soft tissue neck ultrasound was ordered to evaluate for goiter or mass, but has not yet been performed. Still with some pulling sensation at times only. No new sx's.  Lab Results  Component Value Date   TSH 0.815 08/07/2018    Sinus congestion: Started with cold sx's few weeks ago. Sinus congestion. Was improving, but not gone, then worsened about a week ago. More aches, chills. Some face pain/congestion - sinus pain. Initial discolored mucus, more clear. May be feeling a little better past few days.   Reflux Heartburn at times in the past, but has noticed recently that food gets stuck at times over past few years. and has to vomit it back up, but more often past few months. Chicken or rice can cause it. . Has discomfort with swallowing any food past few months. Has not seen GI recently.  No wt loss, night sweats, fever.  No hemoptysis.  No alcohol.  No tobacco.  Tx: occasional "acid relief".     Patient Active Problem List   Diagnosis Date Noted  . Essential hypertension 09/05/2015  . Hypothyroid 07/02/2012  . Anxiety 07/02/2012  . BMI 31.0-31.9,adult 07/02/2012   Past Medical  History:  Diagnosis Date  . Hypertension    No past surgical history on file. Allergies  Allergen Reactions  . Codeine    Prior to Admission medications   Medication Sig Start Date End Date Taking? Authorizing Provider  hydrochlorothiazide (MICROZIDE) 12.5 MG capsule TAKE 1 CAPSULE BY MOUTH EVERY DAY 08/07/18  Yes Wendie Agreste, MD  levothyroxine (SYNTHROID, LEVOTHROID) 150 MCG tablet Take 1 tablet (150 mcg total) by mouth daily. 08/07/18  Yes Wendie Agreste, MD  PARoxetine (PAXIL) 20 MG tablet Take 1 tablet (20 mg total) by mouth daily. 08/07/18  Yes Wendie Agreste, MD   Social History   Socioeconomic History  . Marital status: Widowed    Spouse name: Not on file  . Number of children: Not on file  . Years of education: Not on file  . Highest education level: Not on file  Occupational History  . Not on file  Social Needs  . Financial resource strain: Not on file  . Food insecurity:    Worry: Not on file    Inability: Not on file  . Transportation needs:    Medical: Not on file    Non-medical: Not on file  Tobacco Use  . Smoking status: Never Smoker  . Smokeless tobacco: Never Used  Substance and Sexual Activity  . Alcohol use: No  . Drug use: No  . Sexual  activity: Never  Lifestyle  . Physical activity:    Days per week: Not on file    Minutes per session: Not on file  . Stress: Not on file  Relationships  . Social connections:    Talks on phone: Not on file    Gets together: Not on file    Attends religious service: Not on file    Active member of club or organization: Not on file    Attends meetings of clubs or organizations: Not on file    Relationship status: Not on file  . Intimate partner violence:    Fear of current or ex partner: Not on file    Emotionally abused: Not on file    Physically abused: Not on file    Forced sexual activity: Not on file  Other Topics Concern  . Not on file  Social History Narrative  . Not on file    Review of  Systems Per HPI.     Objective:   Physical Exam Vitals signs reviewed.  Constitutional:      General: She is not in acute distress.    Appearance: She is well-developed.  HENT:     Head: Normocephalic and atraumatic.     Right Ear: Hearing, tympanic membrane, ear canal and external ear normal.     Left Ear: Hearing, tympanic membrane, ear canal and external ear normal.     Nose:     Right Sinus: No maxillary sinus tenderness or frontal sinus tenderness.     Left Sinus: Frontal sinus tenderness present. No maxillary sinus tenderness.     Mouth/Throat:     Pharynx: No oropharyngeal exudate.  Eyes:     Conjunctiva/sclera: Conjunctivae normal.     Pupils: Pupils are equal, round, and reactive to light.  Neck:     Musculoskeletal: Muscular tenderness (R SCM, no masses noted. ) present.     Thyroid: No thyroid tenderness.     Vascular: No carotid bruit.  Cardiovascular:     Rate and Rhythm: Normal rate and regular rhythm.     Heart sounds: Normal heart sounds. No murmur.  Pulmonary:     Effort: Pulmonary effort is normal. No respiratory distress.     Breath sounds: Normal breath sounds. No wheezing or rhonchi.  Abdominal:     Palpations: Abdomen is soft. There is no pulsatile mass.     Tenderness: There is no abdominal tenderness.  Lymphadenopathy:     Cervical: No cervical adenopathy.  Skin:    General: Skin is warm and dry.     Findings: No rash.  Neurological:     Mental Status: She is alert and oriented to person, place, and time.  Psychiatric:        Behavior: Behavior normal.    Vitals:   09/03/18 0922 09/03/18 0933  BP: (!) 160/83 130/76  Pulse: 69   Temp: 97.6 F (36.4 C)   TempSrc: Oral   SpO2: 95%   Weight: 233 lb 3.2 oz (105.8 kg)   Height: 5\' 6"  (1.676 m)        Assessment & Plan:   Christine [name] is a 58 y.o. female Essential hypertension  -Improved, stable on current regimen  Sinusitis, unspecified chronicity, unspecified location - Plan:  amoxicillin-clavulanate (AUGMENTIN) 875-125 MG tablet  -Secondary sickening suspicious for frontal sinusitis but improving recently.  Option of starting Augmentin if symptoms are not continuing to improve this next week.  Neck pain  -Peers to be localized around the right  SCM muscle, but ultrasound is pending.  Can try gentle range of motion of neck and stretches but not to overdo stretching as she thinks this may be contributing.  Achalasia - Plan: Ambulatory referral to Gastroenterology, omeprazole (PRILOSEC) 20 MG capsule Heartburn - Plan: Ambulatory referral to Gastroenterology, omeprazole (PRILOSEC) 20 MG capsule  -Start omeprazole, refer to gastroenterology as may need endoscopy.  Meds ordered this encounter  Medications  . amoxicillin-clavulanate (AUGMENTIN) 875-125 MG tablet    Sig: Take 1 tablet by mouth 2 (two) times daily.    Dispense:  20 tablet    Refill:  0  . omeprazole (PRILOSEC) 20 MG capsule    Sig: Take 1 capsule (20 mg total) by mouth daily.    Dispense:  30 capsule    Refill:  1   Patient Instructions     If the sinus symptoms continue to improve you may not need any specific treatment.  If it starts to worsen again you can start the antibiotic Augmentin twice per day for 10 days for possible sinus infection.  Neck range of motion stretches if needed at times for soreness on right side. I will check into ultrasound.   I will refer you to gastroenterology for the heartburn and food getting stuck but would recommend starting omeprazole once per day for possible heartburn at this time.  Return to the clinic or go to the nearest emergency room if any of your symptoms worsen or new symptoms occur.   If you have lab work done today you will be contacted with your lab results within the next 2 weeks.  If you have not heard from [place] then please contact us. The fastest way to get your results is to register for My Chart.   IF you received an x-ray today, you will  receive an invoice from [facility]. Please contact [facility] at [phone] with questions or concerns regarding your invoice.   IF you received labwork today, you will receive an invoice from [place]. Please contact [facility] at [phone] with questions or concerns regarding your invoice.   Our billing staff will not be able to assist you with questions regarding bills from these companies.  You will be contacted with the lab results as soon as they are available. The fastest way to get your results is to activate your My Chart account. Instructions are located on the last page of this paperwork. If you have not heard from [place] regarding the results in 2 weeks, please contact this office.       Signed,   Merri Ray, MD Primary Care at [place].  09/03/18 10:02 AM

## 2018-09-03 NOTE — Patient Instructions (Addendum)
   If the sinus symptoms continue to improve you may not need any specific treatment.  If it starts to worsen again you can start the antibiotic Augmentin twice per day for 10 days for possible sinus infection.  Neck range of motion stretches if needed at times for soreness on right side. I will check into ultrasound.   I will refer you to gastroenterology for the heartburn and food getting stuck but would recommend starting omeprazole once per day for possible heartburn at this time.  Return to the clinic or go to the nearest emergency room if any of your symptoms worsen or new symptoms occur.   If you have lab work done today you will be contacted with your lab results within the next 2 weeks.  If you have not heard from [place] then please contact us. The fastest way to get your results is to register for My Chart.   IF you received an x-ray today, you will receive an invoice from [facility]. Please contact [facility] at [phone] with questions or concerns regarding your invoice.   IF you received labwork today, you will receive an invoice from [place]. Please contact [facility] at [phone] with questions or concerns regarding your invoice.   Our billing staff will not be able to assist you with questions regarding bills from these companies.  You will be contacted with the lab results as soon as they are available. The fastest way to get your results is to activate your My Chart account. Instructions are located on the last page of this paperwork. If you have not heard from [place] regarding the results in 2 weeks, please contact this office.

## 2018-09-17 ENCOUNTER — Ambulatory Visit: Payer: BLUE CROSS/BLUE SHIELD | Admitting: Gastroenterology

## 2018-09-27 ENCOUNTER — Other Ambulatory Visit: Payer: Self-pay | Admitting: Family Medicine

## 2018-09-27 DIAGNOSIS — R12 Heartburn: Secondary | ICD-10-CM

## 2018-09-27 DIAGNOSIS — K220 Achalasia of cardia: Secondary | ICD-10-CM

## 2019-04-24 ENCOUNTER — Other Ambulatory Visit: Payer: Self-pay | Admitting: Family Medicine

## 2019-04-24 DIAGNOSIS — E039 Hypothyroidism, unspecified: Secondary | ICD-10-CM

## 2019-04-24 DIAGNOSIS — I10 Essential (primary) hypertension: Secondary | ICD-10-CM

## 2019-04-24 DIAGNOSIS — F410 Panic disorder [episodic paroxysmal anxiety] without agoraphobia: Secondary | ICD-10-CM

## 2019-04-24 NOTE — Telephone Encounter (Signed)
Forwarding medication refill request to the clinical pool for review. 

## 2019-09-12 ENCOUNTER — Other Ambulatory Visit: Payer: Self-pay | Admitting: Family Medicine

## 2019-09-12 DIAGNOSIS — E039 Hypothyroidism, unspecified: Secondary | ICD-10-CM

## 2019-10-22 ENCOUNTER — Other Ambulatory Visit: Payer: Self-pay | Admitting: Family Medicine

## 2019-10-22 DIAGNOSIS — F410 Panic disorder [episodic paroxysmal anxiety] without agoraphobia: Secondary | ICD-10-CM

## 2019-10-22 DIAGNOSIS — I10 Essential (primary) hypertension: Secondary | ICD-10-CM

## 2019-10-22 NOTE — Telephone Encounter (Signed)
Requested  medications are  due for refill today yes  Requested medications are on the active medication list yes  Last refill 07/27/19  Future visit scheduled no  Notes to clinic  Failed protocol for visit and labs

## 2019-10-22 NOTE — Telephone Encounter (Signed)
Patient needs to be scheduled. Thanks  FYI - your patient. Last OV Jan 2020, last refill was Sept 2020 for 90 days no refills.

## 2019-10-26 NOTE — Telephone Encounter (Signed)
Pt called and sch med refill appt for 11/16/19

## 2019-10-26 NOTE — Telephone Encounter (Signed)
Called pt to sch apt for refill she will call back when she is near her calendar.

## 2019-11-16 ENCOUNTER — Ambulatory Visit: Payer: 59 | Admitting: Family Medicine

## 2019-11-16 ENCOUNTER — Other Ambulatory Visit: Payer: Self-pay | Admitting: Family Medicine

## 2019-11-16 ENCOUNTER — Encounter: Payer: Self-pay | Admitting: Family Medicine

## 2019-11-16 ENCOUNTER — Other Ambulatory Visit: Payer: Self-pay

## 2019-11-16 VITALS — BP 125/89 | HR 92 | Temp 98.1°F | Ht 66.0 in | Wt 231.0 lb

## 2019-11-16 DIAGNOSIS — E785 Hyperlipidemia, unspecified: Secondary | ICD-10-CM

## 2019-11-16 DIAGNOSIS — E039 Hypothyroidism, unspecified: Secondary | ICD-10-CM | POA: Diagnosis not present

## 2019-11-16 DIAGNOSIS — I10 Essential (primary) hypertension: Secondary | ICD-10-CM

## 2019-11-16 DIAGNOSIS — F410 Panic disorder [episodic paroxysmal anxiety] without agoraphobia: Secondary | ICD-10-CM

## 2019-11-16 DIAGNOSIS — R12 Heartburn: Secondary | ICD-10-CM

## 2019-11-16 DIAGNOSIS — Z1231 Encounter for screening mammogram for malignant neoplasm of breast: Secondary | ICD-10-CM

## 2019-11-16 DIAGNOSIS — Z1211 Encounter for screening for malignant neoplasm of colon: Secondary | ICD-10-CM

## 2019-11-16 DIAGNOSIS — R7303 Prediabetes: Secondary | ICD-10-CM

## 2019-11-16 DIAGNOSIS — K220 Achalasia of cardia: Secondary | ICD-10-CM

## 2019-11-16 MED ORDER — PAROXETINE HCL 20 MG PO TABS: 20.0000 mg | ORAL_TABLET | Freq: Every day | ORAL | 1 refills | Status: DC

## 2019-11-16 MED ORDER — HYDROCHLOROTHIAZIDE 12.5 MG PO CAPS: 12.5000 mg | ORAL_CAPSULE | Freq: Every day | ORAL | 1 refills | Status: DC

## 2019-11-16 MED ORDER — LEVOTHYROXINE SODIUM 150 MCG PO TABS: 150.0000 ug | ORAL_TABLET | Freq: Every day | ORAL | 1 refills | Status: DC

## 2019-11-16 NOTE — Progress Notes (Signed)
Subjective:  Patient ID: Christine [name], female    DOB: 25-Jan-1961  Age: 59 y.o. MRN: [id number]  CC:  Chief Complaint  Patient presents with  . Medication Refill    on hypertension, hypothroidism, and hyperlipidedmia. pt hasn't had any issues with her BP at home, but pt dosen't check offten no physical symptoms of hypertension and hyperlipidemia. no changes with pt's hypothyroidism. medication seems to be working well with no side effects.    HPI Christine [name] presents for   Hypertension: Hydrochlorothiazide 12.5 mg daily. No new side effects.  Home readings: no recent readings.  BP Readings from Last 3 Encounters:  11/16/19 125/89  09/03/18 130/76  08/07/18 (!) 148/94   Lab Results  Component Value Date   CREATININE 0.85 11/16/2019   Hypothyroidism: Lab Results  Component Value Date   TSH 1.420 11/16/2019   Taking medication daily.  Synthroid 150 mcg daily.  No new hot or cold intolerance. No new hair or skin changes, heart palpitations or new fatigue - some persistence fatigue. Working on weight loss - has lost the 10 pound weight gain with Noom.   Hyperlipidemia: No current statin.  Exercise, diet changes recommended with repeat testing in 3 to 6 months when elevated in January 2020.  The 10-year ASCVD risk score Mikey Bussing DC Brooke Bonito., et al., 2013) is: 4.5%   Values used to calculate the score:     Age: 75 years     Sex: Female     Is Non-Hispanic African American: No     Diabetic: No     Tobacco smoker: No     Systolic Blood Pressure: 481 mmHg     Is BP treated: Yes     HDL Cholesterol: 46 mg/dL     Total Cholesterol: 232 mg/dL   Lab Results  Component Value Date   CHOL 232 (H) 11/16/2019   HDL 46 11/16/2019   LDLCALC 151 (H) 11/16/2019   TRIG 194 (H) 11/16/2019   CHOLHDL 5.0 (H) 11/16/2019   Lab Results  Component Value Date   ALT 40 (H) 11/16/2019   AST 27 11/16/2019   ALKPHOS 68 11/16/2019   BILITOT 0.5 11/16/2019   Prediabetes Lab Results  Component  Value Date   HGBA1C 6.0 (H) 11/16/2019   Wt Readings from Last 3 Encounters:  11/16/19 231 lb (104.8 kg)  09/03/18 233 lb 3.2 oz (105.8 kg)  08/07/18 230 lb (104.3 kg)  losing weigh with Noom as above since February.  Exercise: no regular exercise currently. Had prior online course.  Anxiety: paxil 31m qd. Doing well, no recent panic attack.   [facility]/16[facility]2019 09/03/2018 09/03/2018 08/07/2018 01/28/2018  Decreased Interest 0 0 0 0 0  Down, Depressed, Hopeless 0 0 0 0 0  PHQ - 2 Score 0 0 0 0 0  Altered sleeping - - - - -  Tired, decreased energy - - - - -  Change in appetite - - - - -  Feeling bad or failure about yourself  - - - - -  Trouble concentrating - - - - -  Moving slowly or fidgety/restless - - - - -  Suicidal thoughts - - - - -  PHQ-9 Score - - - - -  Difficult doing work/chores - - - - -   No flowsheet data found.   GERD with intermittent achalasia. Discussed in January 2020, referred to gastroenterology but apparently that office was not in network. Heartburn improved with change in  eating, less late meals. Still feeling of food getting stuck - has had to regurgitate food - 2-4 times per year. . Intermittent prilosec in past, none recently No unexplained wt loss, dark stools, night sweats, fevers.   Health maintenance Due for colonoscopy, mammogram, Pap testing  - deferred -will have with GYN Dr.Taavon.     History Patient Active Problem List   Diagnosis Date Noted  . Essential hypertension 09/05/2015  . Hypothyroid 07/02/2012  . Anxiety 07/02/2012  . BMI 31.0-31.9,adult 07/02/2012   Past Medical History:  Diagnosis Date  . Hypertension    History reviewed. No pertinent surgical history. Allergies  Allergen Reactions  . Codeine    Prior to Admission medications   Medication Sig Start Date End Date Taking? Authorizing Provider  hydrochlorothiazide (MICROZIDE) 12.5 MG capsule TAKE 1 CAPSULE BY MOUTH EVERY DAY 04/24/19  Yes Wendie Agreste, MD  levothyroxine (SYNTHROID) 150 MCG tablet TAKE 1 TABLET BY MOUTH EVERY DAY 04/24/19  Yes Wendie Agreste, MD  omeprazole (PRILOSEC) 20 MG capsule TAKE 1 CAPSULE BY MOUTH EVERY DAY 09/29/18  Yes Wendie Agreste, MD  PARoxetine (PAXIL) 20 MG tablet TAKE 1 TABLET BY MOUTH EVERY DAY 04/24/19  Yes Wendie Agreste, MD  amoxicillin-clavulanate (AUGMENTIN) 875-125 MG tablet Take 1 tablet by mouth 2 (two) times daily. Patient not taking: Reported on 11/16/2019 09/03/18   Wendie Agreste, MD   Social History   Socioeconomic History  . Marital status: Widowed    Spouse name: Not on file  . Number of children: Not on file  . Years of education: Not on file  . Highest education level: Not on file  Occupational History  . Not on file  Tobacco Use  . Smoking status: Never Smoker  . Smokeless tobacco: Never Used  Substance and Sexual Activity  . Alcohol use: No  . Drug use: No  . Sexual activity: Never  Other Topics Concern  . Not on file  Social History Narrative  . Not on file   Social Determinants of Health   Financial Resource Strain:   . Difficulty of Paying Living Expenses:   Food Insecurity:   . Worried About Charity fundraiser in the Last Year:   . Arboriculturist in the Last Year:   Transportation Needs:   . Film/video editor (Medical):   Marland Kitchen Lack of Transportation (Non-Medical):   Physical Activity:   . Days of Exercise per Week:   . Minutes of Exercise per Session:   Stress:   . Feeling of Stress :   Social Connections:   . Frequency of Communication with Friends and Family:   . Frequency of Social Gatherings with Friends and Family:   . Attends Religious Services:   . Active Member of Clubs or Organizations:   . Attends Archivist Meetings:   Marland Kitchen Marital Status:   Intimate Partner Violence:   . Fear of Current or Ex-Partner:   . Emotionally Abused:   Marland Kitchen Physically Abused:   . Sexually Abused:     Review of Systems  Constitutional:  Negative for fatigue and unexpected weight change.  Respiratory: Negative for chest tightness and shortness of breath.   Cardiovascular: Negative for chest pain, palpitations and leg swelling.  Gastrointestinal: Negative for abdominal pain and blood in stool.  Neurological: Negative for dizziness, syncope, light-headedness and headaches.     Objective:   Vitals:   11/16/19 1121  BP: 125/89  Pulse: 92  Temp:  98.1 F (36.7 C)  TempSrc: Temporal  SpO2: 96%  Weight: 231 lb (104.8 kg)  Height: _0  (1.676 m)     Physical Exam Vitals reviewed.  Constitutional:      Appearance: Normal appearance. She is well-developed.  HENT:     Head: Normocephalic and atraumatic.  Eyes:     Conjunctiva/sclera: Conjunctivae normal.     Pupils: Pupils are equal, round, and reactive to light.  Neck:     Thyroid: No thyromegaly.     Vascular: No carotid bruit.  Cardiovascular:     Rate and Rhythm: Normal rate and regular rhythm.     Heart sounds: Normal heart sounds. No murmur.  Pulmonary:     Effort: Pulmonary effort is normal.     Breath sounds: Normal breath sounds. No wheezing or rales.  Abdominal:     General: There is no abdominal bruit.     Palpations: Abdomen is soft. There is no pulsatile mass.     Tenderness: There is no abdominal tenderness.  Musculoskeletal:     Cervical back: Normal range of motion.  Skin:    General: Skin is warm and dry.  Neurological:     Mental Status: She is alert and oriented to person, place, and time.     Comments: Microfilament testing WNL bilat feet  Psychiatric:        Behavior: Behavior normal.     Assessment & Plan:  Christine [name] is a 59 y.o. female . Essential hypertension - Plan: Lipid panel, hydrochlorothiazide (MICROZIDE) 12.5 MG capsule, CMP14+EGFR, CANCELED: COMPLETE METABOLIC PANEL WITH GFR  -  Stable, tolerating current regimen. Medications refilled. Labs pending as above.  Home monitoring, RTC precautions discussed.  Special  screening for malignant neoplasms, colon - Plan: Ambulatory referral to Gastroenterology   Hyperlipidemia, unspecified hyperlipidemia type - Plan: Lipid panel, CMP14+EGFR, CANCELED: COMPLETE METABOLIC PANEL WITH GFR  -Check labs to determine statin need, diet/exercise discussed, commended on efforts with Noom.   Hypothyroidism, unspecified type - Plan: TSH + free T4, levothyroxine (SYNTHROID) 150 MCG tablet  -  Stable, tolerating current regimen. Medications refilled. Labs pending as above.   Prediabetes - Plan: Hemoglobin A1c, CMP14+EGFR  -Commended on continued diet, exercise discussed, including pool-based exercise if easier  with previous orthopedic issues  Panic attacks - Plan: PARoxetine (PAXIL) 20 MG tablet  -Overall stable, continue Paxil.  Encounter for screening mammogram for malignant neoplasm of breast - Plan: MM Digital Screening   Meds ordered this encounter  Medications  . hydrochlorothiazide (MICROZIDE) 12.5 MG capsule    Sig: Take 1 capsule (12.5 mg total) by mouth daily.    Dispense:  90 capsule    Refill:  1  . levothyroxine (SYNTHROID) 150 MCG tablet    Sig: Take 1 tablet (150 mcg total) by mouth daily.    Dispense:  90 tablet    Refill:  1  . PARoxetine (PAXIL) 20 MG tablet    Sig: Take 1 tablet (20 mg total) by mouth daily.    Dispense:  90 tablet    Refill:  1   Patient Instructions   Great news on weight loss with Noom - keep up the good work. Low intensity exercise like walking or pool based exercise may also help.  No medication changes for now.  Restart omeprazole once per day as we discussed and I will refer you to gastroenterology.  Follow-up in 6 months but let me know if there are questions sooner.  Take care.  How to Take Your Blood Pressure Blood pressure is a measurement of how strongly your blood is pressing against the walls of your arteries. Arteries are blood vessels that carry blood from your heart throughout your body. Your health care  provider takes your blood pressure at each office visit. You can also take your own blood pressure at home with a blood pressure machine. You may need to take your own blood pressure:  To confirm a diagnosis of high blood pressure (hypertension).  To monitor your blood pressure over time.  To make sure your blood pressure medicine is working. Supplies needed: To take your blood pressure, you will need a blood pressure machine. You can buy a blood pressure machine, or blood pressure monitor, at most drugstores or online. There are several types of home blood pressure monitors. When choosing one, consider the following:  Choose a monitor that has an arm cuff.  Choose a cuff that wraps snugly around your upper arm. You should be able to fit only one finger between your arm and the cuff.  Do not choose a monitor that measures your blood pressure from your wrist or finger. Your health care provider can suggest a reliable monitor that will meet your needs. How to prepare To get the most accurate reading, avoid the following for 30 minutes before you check your blood pressure:  Drinking caffeine.  Drinking alcohol.  Eating.  Smoking.  Exercising. Five minutes before you check your blood pressure:  Empty your bladder.  Sit quietly without talking in a dining chair, rather than in a soft couch or armchair. How to take your blood pressure To check your blood pressure, follow the instructions in the manual that came with your blood pressure monitor. If you have a digital blood pressure monitor, the instructions may be as follows: 1. Sit up straight. 2. Place your feet on the floor. Do not cross your ankles or legs. 3. Rest your left arm at the level of your heart on a table or desk or on the arm of a chair. 4. Pull up your shirt sleeve. 5. Wrap the blood pressure cuff around the upper part of your left arm, 1 inch (2.5 cm) above your elbow. It is best to wrap the cuff around bare  skin. 6. Fit the cuff snugly around your arm. You should be able to place only one finger between the cuff and your arm. 7. Position the cord inside the groove of your elbow. 8. Press the power button. 9. Sit quietly while the cuff inflates and deflates. 10. Read the digital reading on the monitor screen and write it down (record it). 11. Wait 2-3 minutes, then repeat the steps, starting at step 1. What does my blood pressure reading mean? A blood pressure reading consists of a higher number over a lower number. Ideally, your blood pressure should be below 120/80. The first ("top") number is called the systolic pressure. It is a measure of the pressure in your arteries as your heart beats. The second ("bottom") number is called the diastolic pressure. It is a measure of the pressure in your arteries as the heart relaxes. Blood pressure is classified into four stages. The following are the stages for adults who do not have a short-term serious illness or a chronic condition. Systolic pressure and diastolic pressure are measured in a unit called mm Hg. Normal  Systolic pressure: below 924.  Diastolic pressure: below 80. Elevated  Systolic pressure: 462-863.  Diastolic pressure: below 80. Hypertension  stage 1  Systolic pressure: 423-953.  Diastolic pressure: 20-23. Hypertension stage 2  Systolic pressure: 343 or above.  Diastolic pressure: 90 or above. You can have prehypertension or hypertension even if only the systolic or only the diastolic number in your reading is higher than normal. Follow these instructions at home:  Check your blood pressure as often as recommended by your health care provider.  Take your monitor to the next appointment with your health care provider to make sure: ? That you are using it correctly. ? That it provides accurate readings.  Be sure you understand what your goal blood pressure numbers are.  Tell your health care provider if you are having any  side effects from blood pressure medicine. Contact a health care provider if:  Your blood pressure is consistently high. Get help right away if:  Your systolic blood pressure is higher than 180.  Your diastolic blood pressure is higher than 110. This information is not intended to replace advice given to you by your health care provider. Make sure you discuss any questions you have with your health care provider. Document Revised: 07/05/2017 Document Reviewed: 12/30/2015 Elsevier Patient Education  [employer].    If you have lab work done today you will be contacted with your lab results within the next 2 weeks.  If you have not heard from [place] then please contact us. The fastest way to get your results is to register for My Chart.   IF you received an x-ray today, you will receive an invoice from [facility]. Please contact [facility] at [phone] with questions or concerns regarding your invoice.   IF you received labwork today, you will receive an invoice from [place]. Please contact [facility] at [phone] with questions or concerns regarding your invoice.   Our billing staff will not be able to assist you with questions regarding bills from these companies.  You will be contacted with the lab results as soon as they are available. The fastest way to get your results is to activate your My Chart account. Instructions are located on the last page of this paperwork. If you have not heard from [place] regarding the results in 2 weeks, please contact this office.         Signed, Merri Ray, MD [facility]

## 2019-11-16 NOTE — Telephone Encounter (Signed)
Requested Prescriptions  Pending Prescriptions Disp Refills  . omeprazole (PRILOSEC) 20 MG capsule [Pharmacy Med Name: OMEPRAZOLE DR 20 MG CAPSULE] 90 capsule 2    Sig: TAKE 1 CAPSULE BY MOUTH EVERY DAY     Gastroenterology: Proton Pump Inhibitors Passed - 11/16/2019  3:01 PM      Passed - Valid encounter within last 12 months    Recent Outpatient Visits          Today Special screening for malignant neoplasms, colon   [facility] [place], Ranell Patrick, MD   1 year ago Essential hypertension   [facility], Ranell Patrick, MD   1 year ago Hyperlipidemia, unspecified hyperlipidemia type   [facility], Ranell Patrick, MD   1 year ago Hypothyroidism, unspecified type   [facility], Ranell Patrick, MD   1 year ago Hyperglycemia   [facility], Ranell Patrick, MD      Future Appointments            In 6 months Carlota Raspberry Ranell Patrick, MD [facility] [place], [facility]

## 2019-11-16 NOTE — Patient Instructions (Addendum)
Great news on weight loss with Noom - keep up the good work. Low intensity exercise like walking or pool based exercise may also help.  No medication changes for now.  Restart omeprazole once per day as we discussed and I will refer you to gastroenterology.  Follow-up in 6 months but let me know if there are questions sooner.  Take care.    How to Take Your Blood Pressure Blood pressure is a measurement of how strongly your blood is pressing against the walls of your arteries. Arteries are blood vessels that carry blood from your heart throughout your body. Your health care provider takes your blood pressure at each office visit. You can also take your own blood pressure at home with a blood pressure machine. You may need to take your own blood pressure:  To confirm a diagnosis of high blood pressure (hypertension).  To monitor your blood pressure over time.  To make sure your blood pressure medicine is working. Supplies needed: To take your blood pressure, you will need a blood pressure machine. You can buy a blood pressure machine, or blood pressure monitor, at most drugstores or online. There are several types of home blood pressure monitors. When choosing one, consider the following:  Choose a monitor that has an arm cuff.  Choose a cuff that wraps snugly around your upper arm. You should be able to fit only one finger between your arm and the cuff.  Do not choose a monitor that measures your blood pressure from your wrist or finger. Your health care provider can suggest a reliable monitor that will meet your needs. How to prepare To get the most accurate reading, avoid the following for 30 minutes before you check your blood pressure:  Drinking caffeine.  Drinking alcohol.  Eating.  Smoking.  Exercising. Five minutes before you check your blood pressure:  Empty your bladder.  Sit quietly without talking in a dining chair, rather than in a soft couch or armchair. How to take  your blood pressure To check your blood pressure, follow the instructions in the manual that came with your blood pressure monitor. If you have a digital blood pressure monitor, the instructions may be as follows: 1. Sit up straight. 2. Place your feet on the floor. Do not cross your ankles or legs. 3. Rest your left arm at the level of your heart on a table or desk or on the arm of a chair. 4. Pull up your shirt sleeve. 5. Wrap the blood pressure cuff around the upper part of your left arm, 1 inch (2.5 cm) above your elbow. It is best to wrap the cuff around bare skin. 6. Fit the cuff snugly around your arm. You should be able to place only one finger between the cuff and your arm. 7. Position the cord inside the groove of your elbow. 8. Press the power button. 9. Sit quietly while the cuff inflates and deflates. 10. Read the digital reading on the monitor screen and write it down (record it). 11. Wait 2-3 minutes, then repeat the steps, starting at step 1. What does my blood pressure reading mean? A blood pressure reading consists of a higher number over a lower number. Ideally, your blood pressure should be below 120/80. The first ("top") number is called the systolic pressure. It is a measure of the pressure in your arteries as your heart beats. The second ("bottom") number is called the diastolic pressure. It is a measure of the pressure in your  arteries as the heart relaxes. Blood pressure is classified into four stages. The following are the stages for adults who do not have a short-term serious illness or a chronic condition. Systolic pressure and diastolic pressure are measured in a unit called mm Hg. Normal  Systolic pressure: below 123456.  Diastolic pressure: below 80. Elevated  Systolic pressure: [id number].  Diastolic pressure: below 80. Hypertension stage 1  Systolic pressure: 0000000.  Diastolic pressure: [id number]. Hypertension stage 2  Systolic pressure: XX123456 or  above.  Diastolic pressure: 90 or above. You can have prehypertension or hypertension even if only the systolic or only the diastolic number in your reading is higher than normal. Follow these instructions at home:  Check your blood pressure as often as recommended by your health care provider.  Take your monitor to the next appointment with your health care provider to make sure: ? That you are using it correctly. ? That it provides accurate readings.  Be sure you understand what your goal blood pressure numbers are.  Tell your health care provider if you are having any side effects from blood pressure medicine. Contact a health care provider if:  Your blood pressure is consistently high. Get help right away if:  Your systolic blood pressure is higher than 180.  Your diastolic blood pressure is higher than 110. This information is not intended to replace advice given to you by your health care provider. Make sure you discuss any questions you have with your health care provider. Document Revised: 07/05/2017 Document Reviewed: 12/30/2015 Elsevier Patient Education  [employer].    If you have lab work done today you will be contacted with your lab results within the next 2 weeks.  If you have not heard from [place] then please contact us. The fastest way to get your results is to register for My Chart.   IF you received an x-ray today, you will receive an invoice from [facility]. Please contact [facility] at [phone] with questions or concerns regarding your invoice.   IF you received labwork today, you will receive an invoice from [place]. Please contact [facility] at [phone] with questions or concerns regarding your invoice.   Our billing staff will not be able to assist you with questions regarding bills from these companies.  You will be contacted with the lab results as soon as they are available. The fastest way to get your results is to  activate your My Chart account. Instructions are located on the last page of this paperwork. If you have not heard from [place] regarding the results in 2 weeks, please contact this office.

## 2019-11-17 LAB — CMP14+EGFR
ALT: 40 IU/L — ABNORMAL HIGH (ref 0–32)
AST: 27 IU/L (ref 0–40)
Albumin/Globulin Ratio: 1.6 (ref 1.2–2.2)
Albumin: 4.4 g/dL (ref 3.8–4.9)
Alkaline Phosphatase: 68 IU/L (ref 39–117)
BUN/Creatinine Ratio: 18 (ref 9–23)
BUN: 15 mg/dL (ref 6–24)
Bilirubin Total: 0.5 mg/dL (ref 0.0–1.2)
CO2: 21 mmol/L (ref 20–29)
Calcium: 9.5 mg/dL (ref 8.7–10.2)
Chloride: 106 mmol/L (ref 96–106)
Creatinine, Ser: 0.85 mg/dL (ref 0.57–1.00)
GFR calc Af Amer: 87 mL/min/{1.73_m2} (ref >59)
GFR calc non Af Amer: 76 mL/min/{1.73_m2} (ref >59)
Globulin, Total: 2.7 g/dL (ref 1.5–4.5)
Glucose: 110 mg/dL — ABNORMAL HIGH (ref 65–99)
Potassium: 4.1 mmol/L (ref 3.5–5.2)
Sodium: 142 mmol/L (ref 134–144)
Total Protein: 7.1 g/dL (ref 6.0–8.5)

## 2019-11-17 LAB — LIPID PANEL
Chol/HDL Ratio: 5 ratio — ABNORMAL HIGH (ref 0.0–4.4)
Cholesterol, Total: 232 mg/dL — ABNORMAL HIGH (ref 100–199)
HDL: 46 mg/dL (ref >39)
LDL Chol Calc (NIH): 151 mg/dL — ABNORMAL HIGH (ref 0–99)
Triglycerides: 194 mg/dL — ABNORMAL HIGH (ref 0–149)
VLDL Cholesterol Cal: 35 mg/dL (ref 5–40)

## 2019-11-17 LAB — HEMOGLOBIN A1C
Est. average glucose Bld gHb Est-mCnc: 126 mg/dL
Hgb A1c MFr Bld: 6 % — ABNORMAL HIGH (ref 4.8–5.6)

## 2019-11-17 LAB — TSH+FREE T4
Free T4: 1.36 ng/dL (ref 0.82–1.77)
TSH: 1.42 u[IU]/mL (ref 0.450–4.500)

## 2019-12-07 ENCOUNTER — Encounter: Payer: Self-pay | Admitting: Gastroenterology

## 2019-12-15 ENCOUNTER — Ambulatory Visit: Payer: BLUE CROSS/BLUE SHIELD

## 2020-01-08 ENCOUNTER — Ambulatory Visit: Payer: Self-pay | Admitting: Gastroenterology

## 2020-01-11 ENCOUNTER — Encounter: Payer: Self-pay | Admitting: Gastroenterology

## 2020-01-14 ENCOUNTER — Ambulatory Visit
Admission: RE | Admit: 2020-01-14 | Discharge: 2020-01-14 | Disposition: A | Discharge status: Home / Self Care | Payer: 59 | Source: Ambulatory Visit | Attending: Family Medicine | Admitting: Family Medicine

## 2020-01-14 ENCOUNTER — Other Ambulatory Visit: Payer: Self-pay

## 2020-01-14 DIAGNOSIS — Z1231 Encounter for screening mammogram for malignant neoplasm of breast: Secondary | ICD-10-CM

## 2020-01-19 ENCOUNTER — Other Ambulatory Visit: Payer: Self-pay | Admitting: Family Medicine

## 2020-01-19 DIAGNOSIS — R928 Other abnormal and inconclusive findings on diagnostic imaging of breast: Secondary | ICD-10-CM

## 2020-01-26 ENCOUNTER — Ambulatory Visit
Admission: RE | Admit: 2020-01-26 | Discharge: 2020-01-26 | Disposition: A | Discharge status: Home / Self Care | Payer: 59 | Source: Ambulatory Visit | Attending: Family Medicine | Admitting: Family Medicine

## 2020-01-26 ENCOUNTER — Other Ambulatory Visit: Payer: Self-pay | Admitting: Family Medicine

## 2020-01-26 ENCOUNTER — Other Ambulatory Visit: Payer: Self-pay

## 2020-01-26 DIAGNOSIS — R928 Other abnormal and inconclusive findings on diagnostic imaging of breast: Secondary | ICD-10-CM

## 2020-02-26 ENCOUNTER — Ambulatory Visit: Payer: 59 | Admitting: Gastroenterology

## 2020-04-25 ENCOUNTER — Encounter: Payer: Self-pay | Admitting: Gastroenterology

## 2020-04-25 ENCOUNTER — Ambulatory Visit (INDEPENDENT_AMBULATORY_CARE_PROVIDER_SITE_OTHER): Payer: 59 | Admitting: Gastroenterology

## 2020-04-25 VITALS — BP 144/90 | HR 76 | Ht 66.75 in | Wt 228.2 lb

## 2020-04-25 DIAGNOSIS — K219 Gastro-esophageal reflux disease without esophagitis: Secondary | ICD-10-CM

## 2020-04-25 DIAGNOSIS — R1110 Vomiting, unspecified: Secondary | ICD-10-CM | POA: Diagnosis not present

## 2020-04-25 DIAGNOSIS — R6889 Other general symptoms and signs: Secondary | ICD-10-CM

## 2020-04-25 MED ORDER — SUTAB 1479-225-188 MG PO TABS: ORAL_TABLET | ORAL | 0 refills | Status: DC

## 2020-04-25 NOTE — Progress Notes (Signed)
Referring Provider: Wendie Agreste, MD Primary Care Physician:  Wendie Agreste, MD  Reason for Consultation:  Regurgitation   IMPRESSION:  Reflux with intermittent dysphagia/regurgitation Intermittent right neck pulling Need for colon cancer screening Post-colonoscopy nausea/vomiting Family history of colon polyps (half sister) Possible family history of colon cancer (mother)  Reflux with intermittent dysphagia/regurgitation: EGD recommended/patient request for further evaluation.   Intermittent right neck pulling: Referral to ENT.  Need for colon cancer screening: Colonoscopy recommended. Discussed bowel prep options and strategies to minimize nausea.   PLAN: EGD and colonoscopy Referral to ENT: Left neck pulling  Please see the "Patient Instructions" section for addition details about the plan.  HPI: Christine [name] is a 59 y.o. female referred by Dr. Carlota Raspberry. The history is obtained through the patient and review of her electronic health record. Has hypercholesterolemia, hypertension, thyroid disease, obesity. 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 She has had the Covid vaccine.   Has a longstanding history of reflux presenting with brash, sternal pressure, regurgitation with rice and chicken, occasionally has food stick that she would like to have evaluated. Worsened by fried foods. Improved by eating slowly. Symptoms present for years with a very gradual onset. Occasionally "pulling" on the right neck - unpredictable without identified exacerbating or relieving features and not temporally associated with GI symptoms.  No odynophagia, sore throat, dysphonia, cough, blood in the stool.  She sings a lot.   Prior colonoscopy in 2008 with Dr. Collene Mares was normal. Occurred at the time of evaluation for multiple symptoms that were ultimately attributed to anxiety/panic attacks that started around the time of the death of her husband while she was pregnant with her second  child.  She has severe nausea after her last procedure and would like to avoid this happening again.   Half sister (shared Mother) with colon polyps. Mother with IBS. She died of metastatic liver disease. The primary was never evaluation but the family wonders if it was in the colon due to longstanding symptoms.   No other known family history of colon cancer or polyps. No family history of uterine/endometrial cancer, pancreatic cancer or gastric/stomach cancer.  Labs 11/16/19: normal CMP except for glucose 110, ALT 40 No recent CBC. Last normal 2018. No prior abdominal imaging.    Past Medical History:  Diagnosis Date  . Anxiety   . GERD (gastroesophageal reflux disease)   . HLD (hyperlipidemia)   . Hypertension   . Hypothyroidism     Past Surgical History:  Procedure Laterality Date  . CESAREAN SECTION    . WISDOM TOOTH EXTRACTION      Current Outpatient Medications  Medication Sig Dispense Refill  . hydrochlorothiazide (MICROZIDE) 12.5 MG capsule Take 1 capsule (12.5 mg total) by mouth daily. 90 capsule 1  . levothyroxine (SYNTHROID) 150 MCG tablet Take 1 tablet (150 mcg total) by mouth daily. 90 tablet 1  . PARoxetine (PAXIL) 20 MG tablet Take 1 tablet (20 mg total) by mouth daily. 90 tablet 1  . omeprazole (PRILOSEC) 20 MG capsule Take 20 mg by mouth daily. (Patient not taking: Reported on 04/25/2020)     No current facility-administered medications for this visit.    Allergies as of 04/25/2020 - Review Complete 04/25/2020  Allergen Reaction Noted  . Codeine Nausea Only 07/02/2012    Family History  Problem Relation Age of Onset  . Irritable bowel syndrome Mother   . Liver cancer Mother   . Diabetes Mother   .  Hyperlipidemia Mother   . Colon polyps Sister   . Breast cancer Maternal Grandmother   . Clotting disorder Nephew   . Diabetes Nephew     Social History   Socioeconomic History  . Marital status: Widowed    Spouse name: Not on file  . Number of  children: 2  . Years of education: Not on file  . Highest education level: Not on file  Occupational History  . Occupation: Pharmacist, hospital  . Occupation: Brewing technologist  Tobacco Use  . Smoking status: Former Smoker    Types: Cigarettes    Quit date: 04/25/1985    Years since quitting: 35.0  . Smokeless tobacco: Never Used  Vaping Use  . Vaping Use: Never used  Substance and Sexual Activity  . Alcohol use: No  . Drug use: No  . Sexual activity: Never  Other Topics Concern  . Not on file  Social History Narrative  . Not on file   Social Determinants of Health   Financial Resource Strain:   . Difficulty of Paying Living Expenses: Not on file  Food Insecurity:   . Worried About Charity fundraiser in the Last Year: Not on file  . Ran Out of Food in the Last Year: Not on file  Transportation Needs:   . Lack of Transportation (Medical): Not on file  . Lack of Transportation (Non-Medical): Not on file  Physical Activity:   . Days of Exercise per Week: Not on file  . Minutes of Exercise per Session: Not on file  Stress:   . Feeling of Stress : Not on file  Social Connections:   . Frequency of Communication with Friends and Family: Not on file  . Frequency of Social Gatherings with Friends and Family: Not on file  . Attends Religious Services: Not on file  . Active Member of Clubs or Organizations: Not on file  . Attends Archivist Meetings: Not on file  . Marital Status: Not on file  Intimate Partner Violence:   . Fear of Current or Ex-Partner: Not on file  . Emotionally Abused: Not on file  . Physically Abused: Not on file  . Sexually Abused: Not on file    Review of Systems: 12 system ROS is negative except as noted above with the addition of anxiety and fatigue.   Physical Exam: General:   Alert,  well-nourished, pleasant and cooperative in NAD Head:  Normocephalic and atraumatic. Eyes:  Sclera clear, no icterus.   Conjunctiva pink. Ears:  Normal auditory  acuity. Nose:  No deformity, discharge,  or lesions. Mouth:  No deformity or lesions.   Neck:  Supple; no masses or thyromegaly. Lungs:  Clear throughout to auscultation.   No wheezes. Heart:  Regular rate and rhythm; no murmurs. Abdomen:  Soft,nontender, nondistended, normal bowel sounds, no rebound or guarding. No hepatosplenomegaly.   Rectal:  Deferred  Msk:  Symmetrical. No boney deformities LAD: No inguinal or umbilical LAD Extremities:  No clubbing or edema. Neurologic:  Alert and  oriented x4;  grossly nonfocal Skin:  Intact without significant lesions or rashes. Psych:  Alert and cooperative. Normal mood and affect.     Zofia Peckinpaugh L. Tarri Glenn, MD, MPH 04/25/2020, 10:34 AM

## 2020-04-25 NOTE — Addendum Note (Signed)
Addended by: Stevan Born on: 04/25/2020 01:43 PM   Modules accepted: Orders

## 2020-04-25 NOTE — Patient Instructions (Signed)
If you are age 59 or older, your body mass index should be between 23-30. Your Body mass index is 36.02 kg/m. If this is out of the aforementioned range listed, please consider follow up with your Primary Care Provider.  If you are age 55 or younger, your body mass index should be between 19-25. Your Body mass index is 36.02 kg/m. If this is out of the aformentioned range listed, please consider follow up with your Primary Care Provider.   You have been scheduled for an endoscopy and colonoscopy. Please follow the written instructions given to you at your visit today. Please pick up your prep supplies at the pharmacy within the next 1-3 days. If you use inhalers (even only as needed), please bring them with you on the day of your procedure.  We will refer you to ENT for a consult.  If you have not heard from their office in 2 - 3 weeks, please call our office.  Thank you for entrusting me with your care and choosing [facility].  Dr Tarri Glenn

## 2020-05-23 ENCOUNTER — Ambulatory Visit: Payer: 59 | Admitting: Family Medicine

## 2020-05-23 ENCOUNTER — Encounter: Payer: Self-pay | Admitting: Family Medicine

## 2020-05-23 ENCOUNTER — Other Ambulatory Visit: Payer: Self-pay

## 2020-05-23 VITALS — BP 133/84 | HR 84 | Temp 98.1°F | Resp 16 | Ht 66.75 in | Wt 230.4 lb

## 2020-05-23 DIAGNOSIS — R7303 Prediabetes: Secondary | ICD-10-CM

## 2020-05-23 DIAGNOSIS — M79672 Pain in left foot: Secondary | ICD-10-CM

## 2020-05-23 DIAGNOSIS — M79671 Pain in right foot: Secondary | ICD-10-CM

## 2020-05-23 DIAGNOSIS — E039 Hypothyroidism, unspecified: Secondary | ICD-10-CM | POA: Diagnosis not present

## 2020-05-23 DIAGNOSIS — I10 Essential (primary) hypertension: Secondary | ICD-10-CM

## 2020-05-23 DIAGNOSIS — M9262 Juvenile osteochondrosis of tarsus, left ankle: Secondary | ICD-10-CM

## 2020-05-23 DIAGNOSIS — Z23 Encounter for immunization: Secondary | ICD-10-CM

## 2020-05-23 DIAGNOSIS — F410 Panic disorder [episodic paroxysmal anxiety] without agoraphobia: Secondary | ICD-10-CM

## 2020-05-23 DIAGNOSIS — E785 Hyperlipidemia, unspecified: Secondary | ICD-10-CM

## 2020-05-23 DIAGNOSIS — M9261 Juvenile osteochondrosis of tarsus, right ankle: Secondary | ICD-10-CM

## 2020-05-23 MED ORDER — HYDROCHLOROTHIAZIDE 12.5 MG PO CAPS: 12.5000 mg | ORAL_CAPSULE | Freq: Every day | ORAL | 1 refills | Status: DC

## 2020-05-23 MED ORDER — LEVOTHYROXINE SODIUM 150 MCG PO TABS: 150.0000 ug | ORAL_TABLET | Freq: Every day | ORAL | 1 refills | Status: DC

## 2020-05-23 MED ORDER — PAROXETINE HCL 10 MG PO TABS: 10.0000 mg | ORAL_TABLET | Freq: Every day | ORAL | 1 refills | Status: DC

## 2020-05-23 NOTE — Patient Instructions (Addendum)
Keep a record of your blood pressures outside of the office and if running high let me know.   Frequent activity and Noom should help again with weight. Please let me know if you need some additional resources to meet your weight goals.   Omeprazole up to once per day as needed for heartburn.   Try lower dose of paxil. Let me know how that goes in next 4-6 weeks.    Managing Your Hypertension Hypertension is commonly called high blood pressure. This is when the force of your blood pressing against the walls of your arteries is too strong. Arteries are blood vessels that carry blood from your heart throughout your body. Hypertension forces the heart to work harder to pump blood, and may cause the arteries to become narrow or stiff. Having untreated or uncontrolled hypertension can cause heart attack, stroke, kidney disease, and other problems. What are blood pressure readings? A blood pressure reading consists of a higher number over a lower number. Ideally, your blood pressure should be below 120/80. The first ("top") number is called the systolic pressure. It is a measure of the pressure in your arteries as your heart beats. The second ("bottom") number is called the diastolic pressure. It is a measure of the pressure in your arteries as the heart relaxes. What does my blood pressure reading mean? Blood pressure is classified into four stages. Based on your blood pressure reading, your health care provider may use the following stages to determine what type of treatment you need, if any. Systolic pressure and diastolic pressure are measured in a unit called mm Hg. Normal  Systolic pressure: below 277.  Diastolic pressure: below 80. Elevated  Systolic pressure: 824-235.  Diastolic pressure: below 80. Hypertension stage 1  Systolic pressure: 361-443.  Diastolic pressure: 15-40. Hypertension stage 2  Systolic pressure: 086 or above.  Diastolic pressure: 90 or above. What health risks  are associated with hypertension? Managing your hypertension is an important responsibility. Uncontrolled hypertension can lead to:  A heart attack.  A stroke.  A weakened blood vessel (aneurysm).  Heart failure.  Kidney damage.  Eye damage.  Metabolic syndrome.  Memory and concentration problems. What changes can I make to manage my hypertension? Hypertension can be managed by making lifestyle changes and possibly by taking medicines. Your health care provider will help you make a plan to bring your blood pressure within a normal range. Eating and drinking   Eat a diet that is high in fiber and potassium, and low in salt (sodium), added sugar, and fat. An example eating plan is called the DASH (Dietary Approaches to Stop Hypertension) diet. To eat this way: ? Eat plenty of fresh fruits and vegetables. Try to fill half of your plate at each meal with fruits and vegetables. ? Eat whole grains, such as whole wheat pasta, brown rice, or whole grain bread. Fill about one quarter of your plate with whole grains. ? Eat low-fat diary products. ? Avoid fatty cuts of meat, processed or cured meats, and poultry with skin. Fill about one quarter of your plate with lean proteins such as fish, chicken without skin, beans, eggs, and tofu. ? Avoid premade and processed foods. These tend to be higher in sodium, added sugar, and fat.  Reduce your daily sodium intake. Most people with hypertension should eat less than 1,500 mg of sodium a day.  Limit alcohol intake to no more than 1 drink a day for nonpregnant women and 2 drinks a day for  men. One drink equals 12 oz of beer, 5 oz of wine, or 1 oz of hard liquor. Lifestyle  Work with your health care provider to maintain a healthy body weight, or to lose weight. Ask what an ideal weight is for you.  Get at least 30 minutes of exercise that causes your heart to beat faster (aerobic exercise) most days of the week. Activities may include walking,  swimming, or biking.  Include exercise to strengthen your muscles (resistance exercise), such as weight lifting, as part of your weekly exercise routine. Try to do these types of exercises for 30 minutes at least 3 days a week.  Do not use any products that contain nicotine or tobacco, such as cigarettes and e-cigarettes. If you need help quitting, ask your health care provider.  Control any long-term (chronic) conditions you have, such as high cholesterol or diabetes. Monitoring  Monitor your blood pressure at home as told by your health care provider. Your personal target blood pressure may vary depending on your medical conditions, your age, and other factors.  Have your blood pressure checked regularly, as often as told by your health care provider. Working with your health care provider  Review all the medicines you take with your health care provider because there may be side effects or interactions.  Talk with your health care provider about your diet, exercise habits, and other lifestyle factors that may be contributing to hypertension.  Visit your health care provider regularly. Your health care provider can help you create and adjust your plan for managing hypertension. Will I need medicine to control my blood pressure? Your health care provider may prescribe medicine if lifestyle changes are not enough to get your blood pressure under control, and if:  Your systolic blood pressure is 130 or higher.  Your diastolic blood pressure is 80 or higher. Take medicines only as told by your health care provider. Follow the directions carefully. Blood pressure medicines must be taken as prescribed. The medicine does not work as well when you skip doses. Skipping doses also puts you at risk for problems. Contact a health care provider if:  You think you are having a reaction to medicines you have taken.  You have repeated (recurrent) headaches.  You feel dizzy.  You have swelling in  your ankles.  You have trouble with your vision. Get help right away if:  You develop a severe headache or confusion.  You have unusual weakness or numbness, or you feel faint.  You have severe pain in your chest or abdomen.  You vomit repeatedly.  You have trouble breathing. Summary  Hypertension is when the force of blood pumping through your arteries is too strong. If this condition is not controlled, it may put you at risk for serious complications.  Your personal target blood pressure may vary depending on your medical conditions, your age, and other factors. For most people, a normal blood pressure is less than 120/80.  Hypertension is managed by lifestyle changes, medicines, or both. Lifestyle changes include weight loss, eating a healthy, low-sodium diet, exercising more, and limiting alcohol. This information is not intended to replace advice given to you by your health care provider. Make sure you discuss any questions you have with your health care provider. Document Revised: 11/14/2018 Document Reviewed: 06/20/2016 Elsevier Patient Education  [employer].  If you have lab work done today you will be contacted with your lab results within the next 2 weeks.  If you have not heard  from [place] then please contact us. The fastest way to get your results is to register for My Chart.   IF you received an x-ray today, you will receive an invoice from [facility]. Please contact [facility] at [phone] with questions or concerns regarding your invoice.   IF you received labwork today, you will receive an invoice from [facility]. Please contact [facility] at [phone] with questions or concerns regarding your invoice.   Our billing staff will not be able to assist you with questions regarding bills from these companies.  You will be contacted with the lab results as soon as they are available. The fastest way to get your results is to activate your My Chart  account. Instructions are located on the last page of this paperwork. If you have not heard from [place] regarding the results in 2 weeks, please contact this office.

## 2020-05-23 NOTE — Progress Notes (Addendum)
Subjective:  Patient ID: Christine [name], female    DOB: 04/06/1961  Age: 59 y.o. MRN: [id number]  CC:  Chief Complaint  Patient presents with  . Medication Management    pt here to check in on medications and be eligable for refills. pt also needs labs today     HPI [name] presents for   Hypertension: hctz 12.5mg  qd.  No new side effects.  Home readings: none.  BP Readings from Last 3 Encounters:  05/23/20 133/84  04/25/20 (!) 144/90  11/16/19 125/89   Lab Results  Component Value Date   CREATININE 0.85 11/16/2019   Hypothyroidism: Lab Results  Component Value Date   TSH 1.420 11/16/2019   Taking medication daily. Synthroid 190mcg qd. Rare missed dose - less than once per month.  No new hot or cold intolerance. No new hair or skin changes, heart palpitations or new fatigue. No new weight changes.   Prediabetes: Weight loss prior with Noom, lost 10#, then other distractions with life. Did like Noom - plans on returning to this approach. Finding moments during the day to move - small increments of activity throughout day.  Whole foods worked well in past with avoidance of dairy.   Wt Readings from Last 3 Encounters:  05/23/20 230 lb 6.4 oz (104.5 kg)  04/25/20 228 lb 4 oz (103.5 kg)  11/16/19 231 lb (104.8 kg)   Lab Results  Component Value Date   HGBA1C 6.0 (H) 11/16/2019   GERD with intermittent achalasia Symptoms are improved in April we discussed, did have feeling to need to regurgitate food 2-4 times per year.  Intermittent Prilosec in the past but none at that time.  So recommended and was referred to gastroenterology.  Not taking omeprazole. Only episodic heartburn. Endoscopy/colonoscopy in November. Will be seeing ENT as well.   Anxiety: paxil 20mg  QD.  Working well. No new side effects. No other ssri prior.  Feels like better place in life now.   No flowsheet data found. Depression screen [facility] 2/9 05/23/2020 11/16/2019 09/03/2018 09/03/2018 08/07/2018    Decreased Interest 0 0 0 0 0  Down, Depressed, Hopeless 0 0 0 0 0  PHQ - 2 Score 0 0 0 0 0  Altered sleeping - - - - -  Tired, decreased energy - - - - -  Change in appetite - - - - -  Feeling bad or failure about yourself  - - - - -  Trouble concentrating - - - - -  Moving slowly or fidgety/restless - - - - -  Suicidal thoughts - - - - -  PHQ-9 Score - - - - -  Difficult doing work/chores - - - - -    Hyperlipidemia: No current meds.  Wt Readings from Last 3 Encounters:  05/23/20 230 lb 6.4 oz (104.5 kg)  04/25/20 228 lb 4 oz (103.5 kg)  11/16/19 231 lb (104.8 kg)   The 10-year ASCVD risk score Mikey Bussing DC Jr., et al., 2013) is: 5.5%   Values used to calculate the score:     Age: 32 years     Sex: Female     Is Non-Hispanic African American: No     Diabetic: No     Tobacco smoker: No     Systolic Blood Pressure: 825 mmHg     Is BP treated: Yes     HDL Cholesterol: 46 mg/dL     Total Cholesterol: 232 mg/dL  Lab Results  Component Value Date  CHOL 232 (H) 11/16/2019   HDL 46 11/16/2019   LDLCALC 151 (H) 11/16/2019   TRIG 194 (H) 11/16/2019   CHOLHDL 5.0 (H) 11/16/2019   Lab Results  Component Value Date   ALT 40 (H) 11/16/2019   AST 27 11/16/2019   ALKPHOS 68 11/16/2019   BILITOT 0.5 11/16/2019   Pump bump R foot - sore in feet ans ankles at times. No injury. Sore at times - would like to meet with podiatry wearing soft shoe.    History Patient Active Problem List   Diagnosis Date Noted  . Essential hypertension 09/05/2015  . Hypothyroid 07/02/2012  . Anxiety 07/02/2012  . BMI 31.0-31.9,adult 07/02/2012   Past Medical History:  Diagnosis Date  . Anxiety   . GERD (gastroesophageal reflux disease)   . HLD (hyperlipidemia)   . Hypertension   . Hypothyroidism    Past Surgical History:  Procedure Laterality Date  . CESAREAN SECTION    . WISDOM TOOTH EXTRACTION     Allergies  Allergen Reactions  . Codeine Nausea Only   Prior to Admission  medications   Medication Sig Start Date End Date Taking? Authorizing Provider  hydrochlorothiazide (MICROZIDE) 12.5 MG capsule Take 1 capsule (12.5 mg total) by mouth daily. 11/16/19  Yes Wendie Agreste, MD  levothyroxine (SYNTHROID) 150 MCG tablet Take 1 tablet (150 mcg total) by mouth daily. 11/16/19  Yes Wendie Agreste, MD  PARoxetine (PAXIL) 20 MG tablet Take 1 tablet (20 mg total) by mouth daily. 11/16/19  Yes Wendie Agreste, MD  Sodium Sulfate-Mag Sulfate-KCl ([place]) [phone] MG TABS MANUFACTURER CODESKara Dies: [id number] PCN: CN GROUP: [id number] MEMBER ID: [id number];ENM AS CASH;NO PRIOR AUTHORIZATION 04/25/20  Yes Thornton Park, MD   Social History   Socioeconomic History  . Marital status: Widowed    Spouse name: Not on file  . Number of children: 2  . Years of education: Not on file  . Highest education level: Not on file  Occupational History  . Occupation: Pharmacist, hospital  . Occupation: Brewing technologist  Tobacco Use  . Smoking status: Former Smoker    Types: Cigarettes    Quit date: 04/25/1985    Years since quitting: 35.1  . Smokeless tobacco: Never Used  Vaping Use  . Vaping Use: Never used  Substance and Sexual Activity  . Alcohol use: No  . Drug use: No  . Sexual activity: Never  Other Topics Concern  . Not on file  Social History Narrative  . Not on file   Social Determinants of Health   Financial Resource Strain:   . Difficulty of Paying Living Expenses: Not on file  Food Insecurity:   . Worried About Charity fundraiser in the Last Year: Not on file  . Ran Out of Food in the Last Year: Not on file  Transportation Needs:   . Lack of Transportation (Medical): Not on file  . Lack of Transportation (Non-Medical): Not on file  Physical Activity:   . Days of Exercise per Week: Not on file  . Minutes of Exercise per Session: Not on file  Stress:   . Feeling of Stress : Not on file  Social Connections:   . Frequency of Communication with Friends and Family:  Not on file  . Frequency of Social Gatherings with Friends and Family: Not on file  . Attends Religious Services: Not on file  . Active Member of Clubs or Organizations: Not on file  . Attends Archivist Meetings: Not on file  .  Marital Status: Not on file  Intimate Partner Violence:   . Fear of Current or Ex-Partner: Not on file  . Emotionally Abused: Not on file  . Physically Abused: Not on file  . Sexually Abused: Not on file    Review of Systems  Constitutional: Negative for fatigue and unexpected weight change.  Respiratory: Negative for chest tightness and shortness of breath (in past not current - plans follow up if recurs. ).   Cardiovascular: Negative for chest pain, palpitations and leg swelling.  Gastrointestinal: Negative for abdominal pain and blood in stool.  Neurological: Negative for dizziness, syncope, light-headedness and headaches.     Objective:   Vitals:   05/23/20 1114  BP: 133/84  Pulse: 84  Resp: 16  Temp: 98.1 F (36.7 C)  TempSrc: Temporal  SpO2: 97%  Weight: 230 lb 6.4 oz (104.5 kg)  Height: 5' 6.75" (1.695 m)     Physical Exam Vitals reviewed.  Constitutional:      Appearance: She is well-developed.  HENT:     Head: Normocephalic and atraumatic.  Eyes:     Conjunctiva/sclera: Conjunctivae normal.     Pupils: Pupils are equal, round, and reactive to light.  Neck:     Vascular: No carotid bruit.  Cardiovascular:     Rate and Rhythm: Normal rate and regular rhythm.     Heart sounds: Normal heart sounds.  Pulmonary:     Effort: Pulmonary effort is normal.     Breath sounds: Normal breath sounds.  Abdominal:     Palpations: Abdomen is soft. There is no pulsatile mass.     Tenderness: There is no abdominal tenderness.  Musculoskeletal:     Comments: Prominence of retrocalcaneal area bilaterally c/w haglunds deformity bilaterally.     Skin:    General: Skin is warm and dry.  Neurological:     Mental Status: She is alert  and oriented to person, place, and time.  Psychiatric:        Behavior: Behavior normal.        Assessment & Plan:  VENITA SENG is a 59 y.o. female . Bilateral foot pain - Plan: Ambulatory referral to Podiatry Haglund's deformity of both heels - Plan: Ambulatory referral to Podiatry  - refer to podiatry.   Panic attacks - Plan: PARoxetine (PAXIL) 10 MG tablet  - stable. trial of lower dose paxil, especially with attempts at weight loss. Update o symptoms by mychart in 6 weeks.   Essential hypertension - Plan: Comprehensive metabolic panel, hydrochlorothiazide (MICROZIDE) 12.5 MG capsule  - borderline, option of increased HCTZ dose, home monitoring as option.   Hypothyroidism, unspecified type - Plan: TSH, levothyroxine (SYNTHROID) 150 MCG tablet  -  Stable, tolerating current regimen. Medications refilled. Labs pending as above.   Prediabetes - Plan: Hemoglobin A1c  - plans to restart Noom, continue activity, lower dose paxil as as above.   Hyperlipidemia, unspecified hyperlipidemia type - Plan: Lipid panel  - check lipids, ascvd score, continue diet/exercise approach for now.   Needs flu shot - Plan: Flu Vaccine QUAD 6+ mos PF IM (Fluarix Quad PF)  - not given - will call and nurse visit for flu vaccine at her convenience.   No orders of the defined types were placed in this encounter.  Patient Instructions   Keep a record of your blood pressures outside of the office and if running high let me know.   Frequent activity and Noom should help again with weight. Please let me know  if you need some additional resources to meet your weight goals.   Omeprazole up to once per day as needed for heartburn.   Try lower dose of paxil. Let me know how that goes in next 4-6 weeks.    Managing Your Hypertension Hypertension is commonly called high blood pressure. This is when the force of your blood pressing against the walls of your arteries is too strong. Arteries are blood  vessels that carry blood from your heart throughout your body. Hypertension forces the heart to work harder to pump blood, and may cause the arteries to become narrow or stiff. Having untreated or uncontrolled hypertension can cause heart attack, stroke, kidney disease, and other problems. What are blood pressure readings? A blood pressure reading consists of a higher number over a lower number. Ideally, your blood pressure should be below 120/80. The first ("top") number is called the systolic pressure. It is a measure of the pressure in your arteries as your heart beats. The second ("bottom") number is called the diastolic pressure. It is a measure of the pressure in your arteries as the heart relaxes. What does my blood pressure reading mean? Blood pressure is classified into four stages. Based on your blood pressure reading, your health care provider may use the following stages to determine what type of treatment you need, if any. Systolic pressure and diastolic pressure are measured in a unit called mm Hg. Normal  Systolic pressure: below 829.  Diastolic pressure: below 80. Elevated  Systolic pressure: 937-169.  Diastolic pressure: below 80. Hypertension stage 1  Systolic pressure: 678-938.  Diastolic pressure: 10-17. Hypertension stage 2  Systolic pressure: 510 or above.  Diastolic pressure: 90 or above. What health risks are associated with hypertension? Managing your hypertension is an important responsibility. Uncontrolled hypertension can lead to:  A heart attack.  A stroke.  A weakened blood vessel (aneurysm).  Heart failure.  Kidney damage.  Eye damage.  Metabolic syndrome.  Memory and concentration problems. What changes can I make to manage my hypertension? Hypertension can be managed by making lifestyle changes and possibly by taking medicines. Your health care provider will help you make a plan to bring your blood pressure within a normal range. Eating  and drinking   Eat a diet that is high in fiber and potassium, and low in salt (sodium), added sugar, and fat. An example eating plan is called the DASH (Dietary Approaches to Stop Hypertension) diet. To eat this way: ? Eat plenty of fresh fruits and vegetables. Try to fill half of your plate at each meal with fruits and vegetables. ? Eat whole grains, such as whole wheat pasta, brown rice, or whole grain bread. Fill about one quarter of your plate with whole grains. ? Eat low-fat diary products. ? Avoid fatty cuts of meat, processed or cured meats, and poultry with skin. Fill about one quarter of your plate with lean proteins such as fish, chicken without skin, beans, eggs, and tofu. ? Avoid premade and processed foods. These tend to be higher in sodium, added sugar, and fat.  Reduce your daily sodium intake. Most people with hypertension should eat less than 1,500 mg of sodium a day.  Limit alcohol intake to no more than 1 drink a day for nonpregnant women and 2 drinks a day for men. One drink equals 12 oz of beer, 5 oz of wine, or 1 oz of hard liquor. Lifestyle  Work with your health care provider to maintain a healthy body weight,  or to lose weight. Ask what an ideal weight is for you.  Get at least 30 minutes of exercise that causes your heart to beat faster (aerobic exercise) most days of the week. Activities may include walking, swimming, or biking.  Include exercise to strengthen your muscles (resistance exercise), such as weight lifting, as part of your weekly exercise routine. Try to do these types of exercises for 30 minutes at least 3 days a week.  Do not use any products that contain nicotine or tobacco, such as cigarettes and e-cigarettes. If you need help quitting, ask your health care provider.  Control any long-term (chronic) conditions you have, such as high cholesterol or diabetes. Monitoring  Monitor your blood pressure at home as told by your health care provider. Your  personal target blood pressure may vary depending on your medical conditions, your age, and other factors.  Have your blood pressure checked regularly, as often as told by your health care provider. Working with your health care provider  Review all the medicines you take with your health care provider because there may be side effects or interactions.  Talk with your health care provider about your diet, exercise habits, and other lifestyle factors that may be contributing to hypertension.  Visit your health care provider regularly. Your health care provider can help you create and adjust your plan for managing hypertension. Will I need medicine to control my blood pressure? Your health care provider may prescribe medicine if lifestyle changes are not enough to get your blood pressure under control, and if:  Your systolic blood pressure is 130 or higher.  Your diastolic blood pressure is 80 or higher. Take medicines only as told by your health care provider. Follow the directions carefully. Blood pressure medicines must be taken as prescribed. The medicine does not work as well when you skip doses. Skipping doses also puts you at risk for problems. Contact a health care provider if:  You think you are having a reaction to medicines you have taken.  You have repeated (recurrent) headaches.  You feel dizzy.  You have swelling in your ankles.  You have trouble with your vision. Get help right away if:  You develop a severe headache or confusion.  You have unusual weakness or numbness, or you feel faint.  You have severe pain in your chest or abdomen.  You vomit repeatedly.  You have trouble breathing. Summary  Hypertension is when the force of blood pumping through your arteries is too strong. If this condition is not controlled, it may put you at risk for serious complications.  Your personal target blood pressure may vary depending on your medical conditions, your age, and  other factors. For most people, a normal blood pressure is less than 120/80.  Hypertension is managed by lifestyle changes, medicines, or both. Lifestyle changes include weight loss, eating a healthy, low-sodium diet, exercising more, and limiting alcohol. This information is not intended to replace advice given to you by your health care provider. Make sure you discuss any questions you have with your health care provider. Document Revised: 11/14/2018 Document Reviewed: 06/20/2016 Elsevier Patient Education  [employer].  If you have lab work done today you will be contacted with your lab results within the next 2 weeks.  If you have not heard from [place] then please contact us. The fastest way to get your results is to register for My Chart.   IF you received an x-ray today, you will receive an invoice  from [facility]. Please contact [facility] at [phone] with questions or concerns regarding your invoice.   IF you received labwork today, you will receive an invoice from [facility]. Please contact [facility] at [phone] with questions or concerns regarding your invoice.   Our billing staff will not be able to assist you with questions regarding bills from these companies.  You will be contacted with the lab results as soon as they are available. The fastest way to get your results is to activate your My Chart account. Instructions are located on the last page of this paperwork. If you have not heard from [place] regarding the results in 2 weeks, please contact this office.         Signed, Merri Ray, MD [facility]

## 2020-05-24 LAB — COMPREHENSIVE METABOLIC PANEL WITH GFR
ALT: 35 [IU]/L — ABNORMAL HIGH (ref 0–32)
AST: 21 [IU]/L (ref 0–40)
Albumin/Globulin Ratio: 1.4 (ref 1.2–2.2)
Albumin: 4.5 g/dL (ref 3.8–4.9)
Alkaline Phosphatase: 69 [IU]/L (ref 44–121)
BUN/Creatinine Ratio: 20 (ref 9–23)
BUN: 18 mg/dL (ref 6–24)
Bilirubin Total: 0.3 mg/dL (ref 0.0–1.2)
CO2: 26 mmol/L (ref 20–29)
Calcium: 9.7 mg/dL (ref 8.7–10.2)
Chloride: 101 mmol/L (ref 96–106)
Creatinine, Ser: 0.9 mg/dL (ref 0.57–1.00)
GFR calc Af Amer: 81 mL/min/{1.73_m2}
GFR calc non Af Amer: 70 mL/min/{1.73_m2}
Globulin, Total: 3.2 g/dL (ref 1.5–4.5)
Glucose: 112 mg/dL — ABNORMAL HIGH (ref 65–99)
Potassium: 4.3 mmol/L (ref 3.5–5.2)
Sodium: 140 mmol/L (ref 134–144)
Total Protein: 7.7 g/dL (ref 6.0–8.5)

## 2020-05-24 LAB — LIPID PANEL
Chol/HDL Ratio: 5 ratio — ABNORMAL HIGH (ref 0.0–4.4)
Cholesterol, Total: 283 mg/dL — ABNORMAL HIGH (ref 100–199)
HDL: 57 mg/dL
LDL Chol Calc (NIH): 183 mg/dL — ABNORMAL HIGH (ref 0–99)
Triglycerides: 228 mg/dL — ABNORMAL HIGH (ref 0–149)
VLDL Cholesterol Cal: 43 mg/dL — ABNORMAL HIGH (ref 5–40)

## 2020-05-24 LAB — TSH: TSH: 3.75 u[IU]/mL (ref 0.450–4.500)

## 2020-05-24 LAB — HEMOGLOBIN A1C
Est. average glucose Bld gHb Est-mCnc: 123 mg/dL
Hgb A1c MFr Bld: 5.9 % — ABNORMAL HIGH (ref 4.8–5.6)

## 2020-06-06 ENCOUNTER — Other Ambulatory Visit: Payer: Self-pay | Admitting: Family Medicine

## 2020-06-06 DIAGNOSIS — F410 Panic disorder [episodic paroxysmal anxiety] without agoraphobia: Secondary | ICD-10-CM

## 2020-06-06 NOTE — Telephone Encounter (Signed)
Dosage change on 05/23/20 to Paxil 10 mg tabs already ordered by physician.Refusing this order.

## 2020-06-24 ENCOUNTER — Encounter: Payer: 59 | Admitting: Gastroenterology

## 2020-07-11 ENCOUNTER — Other Ambulatory Visit: Payer: Self-pay | Admitting: Family Medicine

## 2020-07-11 DIAGNOSIS — F410 Panic disorder [episodic paroxysmal anxiety] without agoraphobia: Secondary | ICD-10-CM

## 2020-07-19 ENCOUNTER — Other Ambulatory Visit: Payer: Self-pay

## 2020-07-19 ENCOUNTER — Encounter: Payer: Self-pay | Admitting: Family Medicine

## 2020-07-19 ENCOUNTER — Ambulatory Visit: Payer: 59 | Admitting: Family Medicine

## 2020-07-19 VITALS — BP 142/83 | HR 82 | Temp 98.2°F | Ht 66.75 in | Wt 229.0 lb

## 2020-07-19 DIAGNOSIS — R109 Unspecified abdominal pain: Secondary | ICD-10-CM

## 2020-07-19 LAB — POCT URINALYSIS DIP (MANUAL ENTRY)
Bilirubin, UA: NEGATIVE
Glucose, UA: NEGATIVE mg/dL
Ketones, POC UA: NEGATIVE mg/dL
Leukocytes, UA: NEGATIVE
Nitrite, UA: NEGATIVE
Protein Ur, POC: NEGATIVE mg/dL
Spec Grav, UA: 1.025 (ref 1.010–1.025)
Urobilinogen, UA: 0.2 E.U./dL
pH, UA: 5.5 (ref 5.0–8.0)

## 2020-07-19 LAB — POC MICROSCOPIC URINALYSIS (UMFC): Mucus: ABSENT

## 2020-07-19 NOTE — Progress Notes (Signed)
12/14/20212:33 PM  Christine [name] [birth year], 59 y.o., female [id number]  Chief Complaint  Patient presents with  . Flank Pain    Soreness started this morning     HPI:   Patient is a 59 y.o. female with past medical history significant for HTN who presents today for flank pain.   Noticed right flank pain last night As the day has become more sore but not sharp Was stretching to help resolve  Has noticed spotting over Has never went 1 year without a period   Depression screen [facility] 2/9 05/23/2020 11/16/2019 09/03/2018  Decreased Interest 0 0 0  Down, Depressed, Hopeless 0 0 0  PHQ - 2 Score 0 0 0  Altered sleeping - - -  Tired, decreased energy - - -  Change in appetite - - -  Feeling bad or failure about yourself  - - -  Trouble concentrating - - -  Moving slowly or fidgety/restless - - -  Suicidal thoughts - - -  PHQ-9 Score - - -  Difficult doing work/chores - - -    Fall Risk  07/19/2020 05/23/2020 11/16/2019 09/03/2018 09/03/2018  Falls in the past year? 0 0 0 0 0  Number falls in past yr: 0 0 - - -  Injury with Fall? 0 0 - - -  Risk Factor Category  - - - - -  Risk for fall due to : - No Fall Risks - - -  Follow up Falls evaluation completed Falls evaluation completed Falls evaluation completed - -     Allergies  Allergen Reactions  . Codeine Nausea Only    Prior to Admission medications   Medication Sig Start Date End Date Taking? Authorizing Provider  hydrochlorothiazide (MICROZIDE) 12.5 MG capsule Take 1 capsule (12.5 mg total) by mouth daily. 05/23/20   Wendie Agreste, MD  levothyroxine (SYNTHROID) 150 MCG tablet Take 1 tablet (150 mcg total) by mouth daily. 05/23/20   Wendie Agreste, MD  PARoxetine (PAXIL) 10 MG tablet Take 1 tablet (10 mg total) by mouth daily. 05/23/20   Wendie Agreste, MD  Sodium Sulfate-Mag Sulfate-KCl ([place]) [phone] MG TABS MANUFACTURER Jacquiline Doe: [id number] PCN: CN GROUP: [id number] MEMBER ID: [id number];JFH AS CASH;NO  PRIOR AUTHORIZATION 04/25/20   Thornton Park, MD    Past Medical History:  Diagnosis Date  . Anxiety   . GERD (gastroesophageal reflux disease)   . HLD (hyperlipidemia)   . Hypertension   . Hypothyroidism     Past Surgical History:  Procedure Laterality Date  . CESAREAN SECTION    . WISDOM TOOTH EXTRACTION      Social History   Tobacco Use  . Smoking status: Former Smoker    Types: Cigarettes    Quit date: 04/25/1985    Years since quitting: 35.2  . Smokeless tobacco: Never Used  Substance Use Topics  . Alcohol use: No    Family History  Problem Relation Age of Onset  . Irritable bowel syndrome Mother   . Liver cancer Mother   . Diabetes Mother   . Hyperlipidemia Mother   . Colon polyps Sister   . Breast cancer Maternal Grandmother   . Clotting disorder Nephew   . Diabetes Nephew     Review of Systems  Constitutional: Negative for chills, fever, malaise/fatigue and weight loss.  Eyes: Negative for blurred vision and double vision.  Respiratory: Negative for cough, shortness of breath and wheezing.   Cardiovascular: Negative for chest pain, palpitations and leg  swelling.  Gastrointestinal: Negative for abdominal pain, blood in stool, constipation, diarrhea, heartburn, nausea and vomiting.  Genitourinary: Positive for flank pain. Negative for dysuria, frequency, hematuria and urgency.  Musculoskeletal: Negative for back pain and joint pain.  Skin: Negative for rash.  Neurological: Negative for dizziness, weakness and headaches.     OBJECTIVE:  Today's Vitals   07/19/20 1401  BP: (!) 142/83  Pulse: 82  Temp: 98.2 F (36.8 C)  SpO2: 98%  Weight: 229 lb (103.9 kg)  Height: 5' 6.75" (1.695 m)   Body mass index is 36.14 kg/m.   Physical Exam Constitutional:      General: She is not in acute distress.    Appearance: Normal appearance. She is not ill-appearing.  HENT:     Head: Normocephalic.  Cardiovascular:     Rate and Rhythm: Normal rate and  regular rhythm.     Pulses: Normal pulses.     Heart sounds: Normal heart sounds. No murmur heard. No friction rub. No gallop.   Pulmonary:     Effort: Pulmonary effort is normal. No respiratory distress.     Breath sounds: Normal breath sounds. No stridor. No wheezing, rhonchi or rales.  Abdominal:     General: Bowel sounds are normal.     Palpations: Abdomen is soft.     Tenderness: There is no abdominal tenderness.  Musculoskeletal:     Thoracic back: Normal.     Lumbar back: Normal.     Right lower leg: No edema.     Left lower leg: No edema.  Skin:    General: Skin is warm and dry.  Neurological:     Mental Status: She is alert and oriented to person, place, and time.  Psychiatric:        Mood and Affect: Mood normal.        Behavior: Behavior normal.     Results for orders placed or performed in visit on 07/19/20 (from the past 24 hour(s))  POCT urinalysis dipstick     Status: Abnormal   Collection Time: 07/19/20  2:19 PM  Result Value Ref Range   Color, UA yellow yellow   Clarity, UA clear clear   Glucose, UA negative negative mg/dL   Bilirubin, UA negative negative   Ketones, POC UA negative negative mg/dL   Spec Grav, UA 1.025 1.010 - 1.025   Blood, UA trace-intact (A) negative   pH, UA 5.5 5.0 - 8.0   Protein Ur, POC negative negative mg/dL   Urobilinogen, UA 0.2 0.2 or 1.0 E.U./dL   Nitrite, UA Negative Negative   Leukocytes, UA Negative Negative    No results found.   ASSESSMENT and PLAN  Problem List Items Addressed This Visit   None   Visit Diagnoses    Flank pain    -  Primary   Relevant Orders   POCT urinalysis dipstick (Completed): Trace blood   CBC with Differential   CMP14+EGFR   POCT Microscopic Urinalysis ([facility])   Urine Culture   Will follow up with lab results Discussed increasing fluids Pain control as needed with OTC Tylenol/NSAID Use heating pad as needed for comfort      Return for Next scheduled appointment.   Huston Foley  Tashia Leiterman, FNP-BC [facility] [place], [place] [address] Ph.  [phone] Fax [phone]

## 2020-07-19 NOTE — Patient Instructions (Signed)

## 2020-07-20 LAB — CBC WITH DIFFERENTIAL/PLATELET
Basophils Absolute: 0.1 10*3/uL (ref 0.0–0.2)
Basos: 1 %
EOS (ABSOLUTE): 0.2 10*3/uL (ref 0.0–0.4)
Eos: 2 %
Hematocrit: 41.4 % (ref 34.0–46.6)
Hemoglobin: 13.7 g/dL (ref 11.1–15.9)
Immature Grans (Abs): 0 10*3/uL (ref 0.0–0.1)
Immature Granulocytes: 0 %
Lymphocytes Absolute: 2.3 10*3/uL (ref 0.7–3.1)
Lymphs: 37 %
MCH: 28.9 pg (ref 26.6–33.0)
MCHC: 33.1 g/dL (ref 31.5–35.7)
MCV: 87 fL (ref 79–97)
Monocytes Absolute: 0.4 10*3/uL (ref 0.1–0.9)
Monocytes: 6 %
Neutrophils Absolute: 3.3 10*3/uL (ref 1.4–7.0)
Neutrophils: 54 %
Platelets: 281 10*3/uL (ref 150–450)
RBC: 4.74 x10E6/uL (ref 3.77–5.28)
RDW: 12.5 % (ref 11.7–15.4)
WBC: 6.2 10*3/uL (ref 3.4–10.8)

## 2020-07-20 LAB — CMP14+EGFR
ALT: 35 IU/L — ABNORMAL HIGH (ref 0–32)
AST: 19 IU/L (ref 0–40)
Albumin/Globulin Ratio: 1.5 (ref 1.2–2.2)
Albumin: 4.3 g/dL (ref 3.8–4.9)
Alkaline Phosphatase: 61 IU/L (ref 44–121)
BUN/Creatinine Ratio: 24 — ABNORMAL HIGH (ref 9–23)
BUN: 20 mg/dL (ref 6–24)
Bilirubin Total: 0.5 mg/dL (ref 0.0–1.2)
CO2: 26 mmol/L (ref 20–29)
Calcium: 9.7 mg/dL (ref 8.7–10.2)
Chloride: 104 mmol/L (ref 96–106)
Creatinine, Ser: 0.84 mg/dL (ref 0.57–1.00)
GFR calc Af Amer: 88 mL/min/{1.73_m2} (ref >59)
GFR calc non Af Amer: 76 mL/min/{1.73_m2} (ref >59)
Globulin, Total: 2.8 g/dL (ref 1.5–4.5)
Glucose: 127 mg/dL — ABNORMAL HIGH (ref 65–99)
Potassium: 4.3 mmol/L (ref 3.5–5.2)
Sodium: 142 mmol/L (ref 134–144)
Total Protein: 7.1 g/dL (ref 6.0–8.5)

## 2020-07-20 NOTE — Progress Notes (Signed)
If you could let Christine [name] know overall her labs look good. No signs of any infection. I am awaiting the urine culture results at this time.

## 2020-07-21 LAB — URINE CULTURE

## 2020-07-22 ENCOUNTER — Telehealth: Payer: Self-pay | Admitting: Family Medicine

## 2020-07-22 NOTE — Telephone Encounter (Signed)
Pt would like a cb concerning her most recent lab results. Please advise at [phone].

## 2020-07-26 ENCOUNTER — Other Ambulatory Visit: Payer: Self-pay

## 2020-07-26 ENCOUNTER — Ambulatory Visit (AMBULATORY_SURGERY_CENTER): Payer: Self-pay | Admitting: *Deleted

## 2020-07-26 VITALS — Ht 67.75 in | Wt 227.0 lb

## 2020-07-26 DIAGNOSIS — K219 Gastro-esophageal reflux disease without esophagitis: Secondary | ICD-10-CM

## 2020-07-26 DIAGNOSIS — Z1211 Encounter for screening for malignant neoplasm of colon: Secondary | ICD-10-CM

## 2020-07-26 MED ORDER — SUPREP BOWEL PREP KIT 17.5-3.13-1.6 GM/177ML PO SOLN: 1.0000 | Freq: Once | ORAL | 0 refills | Status: AC

## 2020-07-26 NOTE — Progress Notes (Signed)
Completed covid vaccines  Pt is aware that care partner will wait in the car during procedure; if they feel like they will be too hot or cold to wait in the car; they may wait in the 4 th floor lobby. Patient is aware to bring only one care partner. We want them to wear a mask (we do not have any that we can provide them), practice social distancing, and we will check their temperatures when they get here.  I did remind the patient that their care partner needs to stay in the parking lot the entire time and have a cell phone available, we will call them when the pt is ready for discharge. Patient will wear mask into building.  Pt's previsit is done over the phone and all paperwork (prep instructions, blank consent form to just read over, pre-procedure acknowledgement form and stamped envelope) sent to patient   Pt had PONV after colonoscopy, trouble with moving neck,  or hx/fam hx of malignant hyperthermia per pt   No egg or soy allergy  No home oxygen use   No medications for weight loss taken  Pt denies constipation issues

## 2020-08-10 ENCOUNTER — Ambulatory Visit (AMBULATORY_SURGERY_CENTER): Payer: 59 | Admitting: Gastroenterology

## 2020-08-10 ENCOUNTER — Other Ambulatory Visit: Payer: Self-pay

## 2020-08-10 ENCOUNTER — Encounter: Payer: Self-pay | Admitting: Gastroenterology

## 2020-08-10 VITALS — BP 129/64 | HR 68 | Temp 97.5°F | Resp 11 | Ht 66.0 in | Wt 227.0 lb

## 2020-08-10 DIAGNOSIS — K635 Polyp of colon: Secondary | ICD-10-CM

## 2020-08-10 DIAGNOSIS — Z8371 Family history of colonic polyps: Secondary | ICD-10-CM

## 2020-08-10 DIAGNOSIS — K219 Gastro-esophageal reflux disease without esophagitis: Secondary | ICD-10-CM

## 2020-08-10 DIAGNOSIS — D123 Benign neoplasm of transverse colon: Secondary | ICD-10-CM

## 2020-08-10 DIAGNOSIS — D122 Benign neoplasm of ascending colon: Secondary | ICD-10-CM

## 2020-08-10 DIAGNOSIS — K6289 Other specified diseases of anus and rectum: Secondary | ICD-10-CM | POA: Diagnosis not present

## 2020-08-10 DIAGNOSIS — K319 Disease of stomach and duodenum, unspecified: Secondary | ICD-10-CM | POA: Diagnosis not present

## 2020-08-10 DIAGNOSIS — Z1211 Encounter for screening for malignant neoplasm of colon: Secondary | ICD-10-CM

## 2020-08-10 DIAGNOSIS — D124 Benign neoplasm of descending colon: Secondary | ICD-10-CM

## 2020-08-10 MED ORDER — OMEPRAZOLE 40 MG PO CPDR
40.0000 mg | DELAYED_RELEASE_CAPSULE | Freq: Two times a day (BID) | ORAL | 0 refills | Status: DC
Start: 2020-08-10 — End: 2020-11-15

## 2020-08-10 MED ORDER — SODIUM CHLORIDE 0.9 % IV SOLN: 500.0000 mL | Freq: Once | INTRAVENOUS | Status: DC

## 2020-08-10 MED ORDER — OMEPRAZOLE 40 MG PO CPDR: 40.0000 mg | DELAYED_RELEASE_CAPSULE | Freq: Every day | ORAL | 3 refills | Status: DC

## 2020-08-10 NOTE — Progress Notes (Signed)
Called to room to assist during endoscopic procedure.  Patient ID and intended procedure confirmed with present staff. Received instructions for my participation in the procedure from the performing physician.  

## 2020-08-10 NOTE — Patient Instructions (Signed)
Handouts on Polyps provided. Await pathology results.   YOU HAD AN ENDOSCOPIC PROCEDURE TODAY AT THE [facility]:   Refer to the procedure report that was given to you for any specific questions about what was found during the examination.  If the procedure report does not answer your questions, please call your gastroenterologist to clarify.  If you requested that your care partner not be given the details of your procedure findings, then the procedure report has been included in a sealed envelope for you to review at your convenience later.  YOU SHOULD EXPECT: Some feelings of bloating in the abdomen. Passage of more gas than usual.  Walking can help get rid of the air that was put into your GI tract during the procedure and reduce the bloating. If you had a lower endoscopy (such as a colonoscopy or flexible sigmoidoscopy) you may notice spotting of blood in your stool or on the toilet paper. If you underwent a bowel prep for your procedure, you may not have a normal bowel movement for a few days.  Please Note:  You might notice some irritation and congestion in your nose or some drainage.  This is from the oxygen used during your procedure.  There is no need for concern and it should clear up in a day or so.  SYMPTOMS TO REPORT IMMEDIATELY:   Following lower endoscopy (colonoscopy or flexible sigmoidoscopy):  Excessive amounts of blood in the stool  Significant tenderness or worsening of abdominal pains  Swelling of the abdomen that is new, acute  Fever of 100F or higher   Following upper endoscopy (EGD)  Vomiting of blood or coffee ground material  New chest pain or pain under the shoulder blades  Painful or persistently difficult swallowing  New shortness of breath  Fever of 100F or higher  Black, tarry-looking stools  For urgent or emergent issues, a gastroenterologist can be reached at any hour by calling [phone]. Do not use MyChart messaging for urgent  concerns.   DIET:  We do recommend a small meal at first, but then you may proceed to your regular diet.  Drink plenty of fluids but you should avoid alcoholic beverages for 24 hours.   ACTIVITY:  You should plan to take it easy for the rest of today and you should NOT DRIVE or use heavy machinery until tomorrow (because of the sedation medicines used during the test).    FOLLOW UP: Our staff will call the number listed on your records 48-72 hours following your procedure to check on you and address any questions or concerns that you may have regarding the information given to you following your procedure. If we do not reach you, we will leave a message.  We will attempt to reach you two times.  During this call, we will ask if you have developed any symptoms of COVID 19. If you develop any symptoms (ie: fever, flu-like symptoms, shortness of breath, cough etc.) before then, please call [phone].  If you test positive for Covid 19 in the 2 weeks post procedure, please call and report this information to [place].    If any biopsies were taken you will be contacted by phone or by letter within the next 1-3 weeks.  Please call us at [phone] if you have not heard about the biopsies in 3 weeks.   SIGNATURES/CONFIDENTIALITY: You and/or your care partner have signed paperwork which will be entered into your electronic medical record.  These signatures attest to  the fact that that the information above on your After Visit Summary has been reviewed and is understood.  Full responsibility of the confidentiality of this discharge information lies with you and/or your care-partner.

## 2020-08-10 NOTE — Op Note (Signed)
[facility] Patient Name: Christine [name] Procedure Date: 08/10/2020 10:57 AM MRN: [id number] Endoscopist: Thornton Park MD, MD Age: 60 Referring MD:  Date of Birth: 06-18-1961 Gender: Female Account #: [id number] Procedure:                Upper GI endoscopy Indications:              Suspected esophageal refluxReflux with intermittent                            dysphagia/regurgitation Medicines:                Monitored Anesthesia Care Procedure:                Pre-Anesthesia Assessment:                           - Prior to the procedure, a History and Physical                            was performed, and patient medications and                            allergies were reviewed. The patient's tolerance of                            previous anesthesia was also reviewed. The risks                            and benefits of the procedure and the sedation                            options and risks were discussed with the patient.                            All questions were answered, and informed consent                            was obtained. Prior Anticoagulants: The patient has                            taken no previous anticoagulant or antiplatelet                            agents. ASA Grade Assessment: II - A patient with                            mild systemic disease. After reviewing the risks                            and benefits, the patient was deemed in                            satisfactory condition to undergo the procedure.  After obtaining informed consent, the endoscope was                            passed under direct vision. Throughout the                            procedure, the patient's blood pressure, pulse, and                            oxygen saturations were monitored continuously. The                            Endoscope was introduced through the mouth, and                            advanced to the second part of  duodenum. The upper                            GI endoscopy was accomplished without difficulty.                            The patient tolerated the procedure well. Scope In: Scope Out: Findings:                 LA Grade A (one or more mucosal breaks less than 5                            mm, not extending between tops of 2 mucosal folds)                            esophagitis with no bleeding was found. Biopsies                            were taken from the mid/proximal and distal                            esophagus with a cold forceps for histology.                            Estimated blood loss was minimal.                           A few small erosions with no bleeding and no                            stigmata of recent bleeding were found in the                            gastric antrum. Biopsies were taken from the                            antrum, body, and fundus with a cold forceps for  histology. Estimated blood loss was minimal.                           The examined duodenum was normal. Complications:            No immediate complications. Estimated blood loss:                            Minimal. Estimated Blood Loss:     Estimated blood loss was minimal. Impression:               - LA Grade A reflux esophagitis with no bleeding.                            Biopsied.                           - Erosive gastropathy with no bleeding and no                            stigmata of recent bleeding. Biopsied.                           - Normal examined duodenum. Recommendation:           - Patient has a contact number available for                            emergencies. The signs and symptoms of potential                            delayed complications were discussed with the                            patient. Return to normal activities tomorrow.                            Written discharge instructions were provided to the                             patient.                           - Resume previous diet.                           - Continue present medications. Resume omeprazole,                            now using 40 mg BID x 8 weeks, then reducing to 40                            mg daily.                           - No aspirin, ibuprofen, naproxen, or other  non-steroidal anti-inflammatory drugs.                           - Await pathology results.                           - Referral to ENT [id number] right neck pulling Thornton Park MD, MD 08/10/2020 11:51:12 AM This report has been signed electronically.

## 2020-08-10 NOTE — Op Note (Signed)
[facility] Patient Name: Christine [name] Procedure Date: 08/10/2020 10:58 AM MRN: [id number] Endoscopist: Tressia Danas MD, MD Age: 60 Referring MD:  Date of Birth: Mar 26, 1961 Gender: Female Account #: [id number] Procedure:                Colonoscopy Indications:              Screening for colorectal malignant neoplasm                           Family history of colon polyps (half sister)                           Possible family history of colon cancer (mother) Medicines:                Monitored Anesthesia Care, Ondansetron 4 mg IV Procedure:                Pre-Anesthesia Assessment:                           - Prior to the procedure, a History and Physical                            was performed, and patient medications and                            allergies were reviewed. The patient's tolerance of                            previous anesthesia was also reviewed. The risks                            and benefits of the procedure and the sedation                            options and risks were discussed with the patient.                            All questions were answered, and informed consent                            was obtained. Prior Anticoagulants: The patient has                            taken no previous anticoagulant or antiplatelet                            agents. ASA Grade Assessment: II - A patient with                            mild systemic disease. After reviewing the risks                            and benefits, the patient was deemed in  satisfactory condition to undergo the procedure.                           After obtaining informed consent, the colonoscope                            was passed under direct vision. Throughout the                            procedure, the patient's blood pressure, pulse, and                            oxygen saturations were monitored continuously. The                             Olympus CF-[id number]) Colonoscope was                            introduced through the anus and advanced to the 3                            cm into the ileum. The colonoscopy was performed                            without difficulty. The patient tolerated the                            procedure well. The quality of the bowel                            preparation was good. The terminal ileum, ileocecal                            valve, appendiceal orifice, and rectum were                            photographed. Scope In: 11:19:36 AM Scope Out: 11:41:10 AM Scope Withdrawal Time: 0 hours 17 minutes 43 seconds  Total Procedure Duration: 0 hours 21 minutes 34 seconds  Findings:                 The perianal and digital rectal examinations were                            normal.                           A few small-mouthed diverticula were found in the                            sigmoid colon.                           A diffuse area of mildly melanotic mucosa was found  in the rectum however not seen elsewhere in the                            colon. Biopsies were taken with a cold forceps for                            histology. Estimated blood loss was minimal.                           A 3 mm polyp was found in the descending colon. The                            polyp was sessile. The polyp was removed with a                            cold snare. Resection and retrieval were complete.                            Estimated blood loss was minimal.                           A 3 mm polyp was found in the hepatic flexure. The                            polyp was sessile. The polyp was removed with a                            cold snare. Resection and retrieval were complete.                            Estimated blood loss was minimal.                           A 2 mm polyp was found in the ascending colon. The                            polyp was  sessile. The polyp was removed with a                            cold snare. Resection and retrieval were complete.                            Estimated blood loss was minimal.                           The exam was otherwise without abnormality on                            direct and retroflexion views. Complications:            No immediate complications. Estimated blood loss:  Minimal. Estimated Blood Loss:     Estimated blood loss was minimal. Impression:               - Diverticulosis in the sigmoid colon.                           - Melanotic mucosa in the rectum. Biopsied.                           - One 3 mm polyp in the descending colon, removed                            with a cold snare. Resected and retrieved.                           - One 3 mm polyp at the hepatic flexure, removed                            with a cold snare. Resected and retrieved.                           - One 2 mm polyp in the ascending colon, removed                            with a cold snare. Resected and retrieved.                           - The examination was otherwise normal on direct                            and retroflexion views. Recommendation:           - Patient has a contact number available for                            emergencies. The signs and symptoms of potential                            delayed complications were discussed with the                            patient. Return to normal activities tomorrow.                            Written discharge instructions were provided to the                            patient.                           - High fiber diet.                           - Continue present medications.                           -  Await pathology results.                           - Repeat colonoscopy date to be determined after                            pending pathology results are reviewed for                             surveillance taking into account the family history.                           - Emerging evidence supports eating a diet of                            fruits, vegetables, grains, calcium, and yogurt                            while reducing red meat and alcohol may reduce the                            risk of colon cancer.                           - Thank you for allowing me to be involved in your                            colon cancer prevention. Thornton Park MD, MD 08/10/2020 11:57:47 AM This report has been signed electronically.

## 2020-08-10 NOTE — Progress Notes (Signed)
PT taken to PACU. Monitors in place. VSS. Report given to RN. 

## 2020-08-11 ENCOUNTER — Other Ambulatory Visit: Payer: Self-pay

## 2020-08-11 DIAGNOSIS — R6889 Other general symptoms and signs: Secondary | ICD-10-CM

## 2020-08-12 ENCOUNTER — Telehealth: Payer: Self-pay

## 2020-08-12 ENCOUNTER — Telehealth: Payer: Self-pay | Admitting: *Deleted

## 2020-08-12 NOTE — Telephone Encounter (Signed)
Left message on follow up call. 

## 2020-08-12 NOTE — Telephone Encounter (Signed)
No answer, no identifier. Message left we will call later in the day.

## 2020-08-15 ENCOUNTER — Encounter: Payer: Self-pay | Admitting: Gastroenterology

## 2020-08-25 ENCOUNTER — Ambulatory Visit: Payer: 59 | Admitting: Family Medicine

## 2020-09-06 ENCOUNTER — Other Ambulatory Visit: Payer: Self-pay | Admitting: Family Medicine

## 2020-09-06 ENCOUNTER — Other Ambulatory Visit: Payer: Self-pay

## 2020-09-06 ENCOUNTER — Ambulatory Visit
Admission: RE | Admit: 2020-09-06 | Discharge: 2020-09-06 | Disposition: A | Discharge status: Home / Self Care | Payer: 59 | Source: Ambulatory Visit | Attending: Family Medicine | Admitting: Family Medicine

## 2020-09-06 DIAGNOSIS — R928 Other abnormal and inconclusive findings on diagnostic imaging of breast: Secondary | ICD-10-CM

## 2020-09-06 DIAGNOSIS — N6001 Solitary cyst of right breast: Secondary | ICD-10-CM

## 2020-09-13 ENCOUNTER — Encounter: Payer: Self-pay | Admitting: Family Medicine

## 2020-09-13 ENCOUNTER — Ambulatory Visit (INDEPENDENT_AMBULATORY_CARE_PROVIDER_SITE_OTHER): Payer: 59 | Admitting: Family Medicine

## 2020-09-13 ENCOUNTER — Other Ambulatory Visit (HOSPITAL_COMMUNITY)
Admission: RE | Admit: 2020-09-13 | Discharge: 2020-09-13 | Disposition: A | Discharge status: Home / Self Care | Payer: 59 | Source: Ambulatory Visit | Attending: Family Medicine | Admitting: Family Medicine

## 2020-09-13 ENCOUNTER — Other Ambulatory Visit: Payer: Self-pay

## 2020-09-13 VITALS — BP 145/82 | HR 82 | Temp 98.2°F | Ht 66.0 in | Wt 228.0 lb

## 2020-09-13 DIAGNOSIS — Z780 Asymptomatic menopausal state: Secondary | ICD-10-CM | POA: Diagnosis not present

## 2020-09-13 DIAGNOSIS — Z124 Encounter for screening for malignant neoplasm of cervix: Secondary | ICD-10-CM | POA: Diagnosis present

## 2020-09-13 DIAGNOSIS — M25551 Pain in right hip: Secondary | ICD-10-CM | POA: Diagnosis not present

## 2020-09-13 DIAGNOSIS — I10 Essential (primary) hypertension: Secondary | ICD-10-CM | POA: Diagnosis not present

## 2020-09-13 NOTE — Progress Notes (Signed)
2/8/20221:11 PM  Christine [name] 03/21/1961, 60 y.o., female [id number]  Chief Complaint  Patient presents with  . Gynecologic Exam  . Rigth hip pain     Requesting x ray orders     HPI:   Patient is a 60 y.o. female with past medical history significant for HTN who presents today for pap and right hip pain.   Still hasn't went 1 year without period Is wondering about hormone replacement therapy for lack of energy  Right hip xray, chronic issue Denies recent fall Had been seeing a chiropractor in the past for pain Was taking NSAIDS for pain but then was told to stop Takes Tylenol for pain Denies numbness, tingling, decreased strength or dec rom  HTN BP Readings from Last 3 Encounters:  09/13/20 (!) 145/82  08/10/20 129/64  07/19/20 (!) 142/83    Depression screen PHQ 2/9 09/13/2020 05/23/2020 11/16/2019  Decreased Interest 0 0 0  Down, Depressed, Hopeless 0 0 0  PHQ - 2 Score 0 0 0  Altered sleeping - - -  Tired, decreased energy - - -  Change in appetite - - -  Feeling bad or failure about yourself  - - -  Trouble concentrating - - -  Moving slowly or fidgety/restless - - -  Suicidal thoughts - - -  PHQ-9 Score - - -  Difficult doing work/chores - - -    Fall Risk  09/13/2020 07/19/2020 05/23/2020 11/16/2019 09/03/2018  Falls in the past year? 0 0 0 0 0  Number falls in past yr: 0 0 0 - -  Injury with Fall? 0 0 0 - -  Risk Factor Category  - - - - -  Risk for fall due to : - - No Fall Risks - -  Follow up Falls evaluation completed Falls evaluation completed Falls evaluation completed Falls evaluation completed -     Allergies  Allergen Reactions  . Codeine Nausea Only    Prior to Admission medications   Medication Sig Start Date End Date Taking? Authorizing Provider  hydrochlorothiazide (MICROZIDE) 12.5 MG capsule Take 1 capsule (12.5 mg total) by mouth daily. 05/23/20  Yes Wendie Agreste, MD  levothyroxine (SYNTHROID) 150 MCG tablet Take 1 tablet (150  mcg total) by mouth daily. 05/23/20  Yes Wendie Agreste, MD  omeprazole (PRILOSEC) 40 MG capsule Take 1 capsule (40 mg total) by mouth in the morning and at bedtime. 08/10/20  Yes Thornton Park, MD  PARoxetine (PAXIL) 10 MG tablet Take 1 tablet (10 mg total) by mouth daily. 05/23/20  Yes Wendie Agreste, MD    Past Medical History:  Diagnosis Date  . Anxiety   . GERD (gastroesophageal reflux disease)   . HLD (hyperlipidemia)   . Hypertension   . Hypothyroidism     Past Surgical History:  Procedure Laterality Date  . CESAREAN SECTION    . COLONOSCOPY     more than 10 years ago  . WISDOM TOOTH EXTRACTION      Social History   Tobacco Use  . Smoking status: Former Smoker    Types: Cigarettes    Quit date: 04/25/1985    Years since quitting: 35.4  . Smokeless tobacco: Never Used  Substance Use Topics  . Alcohol use: No    Family History  Problem Relation Age of Onset  . Irritable bowel syndrome Mother   . Diabetes Mother   . Hyperlipidemia Mother   . Liver cancer Mother  unsure of original site  . Colon polyps Sister   . Breast cancer Maternal Grandmother   . Clotting disorder Nephew   . Diabetes Nephew   . Colon cancer Neg Hx   . Esophageal cancer Neg Hx   . Rectal cancer Neg Hx   . Stomach cancer Neg Hx     Review of Systems  Constitutional: Negative for chills, fever and malaise/fatigue.  Eyes: Negative for blurred vision and double vision.  Respiratory: Negative for cough, shortness of breath and wheezing.   Cardiovascular: Negative for chest pain, palpitations and leg swelling.  Gastrointestinal: Negative for abdominal pain, blood in stool, constipation, diarrhea, heartburn, nausea and vomiting.  Genitourinary: Negative for dysuria, frequency and hematuria.  Musculoskeletal: Positive for joint pain (right hip pain). Negative for back pain and falls.  Skin: Negative for rash.  Neurological: Negative for dizziness, weakness and headaches.      OBJECTIVE:  Today's Vitals   09/13/20 0854  BP: (!) 145/82  Pulse: 82  Temp: 98.2 F (36.8 C)  SpO2: 97%  Weight: 228 lb (103.4 kg)  Height: 5\' 6"  (1.676 m)   Body mass index is 36.8 kg/m.   Physical Exam Constitutional:      General: She is not in acute distress.    Appearance: Normal appearance. She is not ill-appearing.  HENT:     Head: Normocephalic.  Cardiovascular:     Rate and Rhythm: Normal rate and regular rhythm.     Pulses: Normal pulses.     Heart sounds: Normal heart sounds. No murmur heard. No friction rub. No gallop.   Pulmonary:     Effort: Pulmonary effort is normal. No respiratory distress.     Breath sounds: Normal breath sounds. No stridor. No wheezing, rhonchi or rales.  Abdominal:     General: Bowel sounds are normal.     Palpations: Abdomen is soft.     Tenderness: There is no abdominal tenderness.  Musculoskeletal:     Right hip: Normal. No deformity, tenderness, bony tenderness or crepitus. Normal range of motion. Normal strength.     Left hip: Normal.     Right lower leg: No edema.     Left lower leg: No edema.  Skin:    General: Skin is warm and dry.  Neurological:     Mental Status: She is alert and oriented to person, place, and time.  Psychiatric:        Mood and Affect: Mood normal.        Behavior: Behavior normal.    Pelvic exam: normal external genitalia, vulva, vagina, cervix, uterus and adnexa, VULVA: normal appearing vulva with no masses, tenderness or lesions, VAGINA: normal appearing vagina with normal color and discharge, no lesions, CERVIX: normal appearing cervix without discharge or lesions, UTERUS: uterus is normal size, shape, consistency and nontender, ADNEXA: normal adnexa in size, nontender and no masses, PAP: Pap smear done today, thin-prep method, DNA probe for chlamydia and GC obtained, HPV test, exam chaperoned by [facility], LPN.   No results found for this or any previous visit (from the past 24 hour(s)).  No  results found.   ASSESSMENT and PLAN  Problem List Items Addressed This Visit      Cardiovascular and Mediastinum   Essential hypertension    Other Visit Diagnoses    Right hip pain    -  Primary   Relevant Orders   DG HIP UNILAT W OR W/O PELVIS 2-3 VIEWS RIGHT   Screening for cervical cancer  Relevant Orders   Cytology - PAP(Ault)   Menopause          Plan . Will follow up with pap results . Will follow up with xray results . Continue current pain management plan . BP goal< 130/80, will continue to monitor   Return if symptoms worsen or fail to improve, for Next scheduled appointment.    Huston Foley Richel Millspaugh, FNP-BC [facility] at [facility], [place] [address] Ph.  [phone] Fax [phone]

## 2020-09-13 NOTE — Patient Instructions (Addendum)
Menopause and Hormone Replacement Therapy Menopause is a normal time of life when menstrual periods stop completely and the ovaries stop producing the female hormones estrogen and progesterone. Low levels of these hormones can affect your health and cause symptoms. Hormone replacement therapy (HRT) can relieve some of those symptoms. HRT is the use of artificial (synthetic) hormones to replace hormones that your body has stopped producing because you have reached menopause. Types of HRT HRT may consist of the synthetic hormones estrogen and progestin, or it may consist of estrogen-only therapy. You and your health care provider will decide which form of HRT is best for you. If you choose to be on HRT and you have a uterus, estrogen and progestin are usually prescribed. Estrogen-only therapy is used for women who do not have a uterus. Possible options for taking HRT include:  Pills.  Patches.  Gels.  Sprays.  Vaginal cream.  Vaginal rings.  Vaginal inserts. The amount of hormones that you take and how long you take them varies according to your health. It is important to:  Begin HRT with the lowest possible dosage.  Stop HRT as soon as your health care provider tells you to stop.  Work with your health care provider so that you feel informed and comfortable with your decisions.   Tell a health care provider about:  Any allergies you have.  Whether you have had blood clots or know of any risk factors you may have for blood clots.  Whether you or family members have had cancer, especially cancer of the breasts, ovaries, or uterus.  Any surgeries you have had.  All medicines you are taking, including vitamins, herbs, eye drops, creams, and over-the-counter medicines.  Whether you are pregnant or may be pregnant.  Any medical conditions you have. What are the benefits? HRT can reduce the frequency and severity of menopausal symptoms. Benefits of HRT vary according to the  kind of symptoms that you have, how severe they are, and your overall health. HRT may help to improve the following symptoms of menopause:  Hot flashes and night sweats. These are sudden feelings of heat that spread over the face and body. The skin may turn red, like a blush. Night sweats are hot flashes that happen while you are sleeping or trying to sleep.  Bone loss (osteoporosis). The body loses calcium more quickly after menopause, causing the bones to become weaker. This can increase the risk for bone breaks (fractures).  Vaginal dryness. The lining of the vagina can become thin and dry, which can cause pain during sex or cause infection, burning, or itching.  Urinary tract infections.  Urinary incontinence. This is the inability to control when you urinate.  Irritability.  Short-term memory problems. What are the risks? Risks of HRT vary depending on your individual health and medical history. Risks of HRT also depend on whether you receive both estrogen and progestin or you receive estrogen only. HRT may increase the risk of:  Spotting. This is when a small amount of blood leaks from the vagina unexpectedly.  Endometrial cancer. This cancer is in the lining of the uterus (endometrium).  Breast cancer.  Increased density of breast tissue. This can make it harder to find breast cancer on a breast X-ray (mammogram).  Stroke.  Heart disease.  Blood clots.  Gallbladder disease or liver disease. Risks of HRT can increase if you have any of the following conditions:  Endometrial cancer.  Liver disease.  Heart disease.  Breast  cancer.  History of blood clots.  History of stroke. Follow these instructions at home: Pap tests  Have Pap tests done as often as told by your health care provider. A Pap test is sometimes called a Pap smear. It is a screening test that is used to check for signs of cancer of the cervix and vagina. A Pap test can also identify the presence of  infection or precancerous changes. Pap tests may be done: ? Every 3 years, starting at age 42. ? Every 5 years, starting after age 73, in combination with testing for human papillomavirus (HPV). ? More often or less often depending on other medical conditions you have, your age, and other risk factors.  It is up to you to get the results of your Pap test. Ask your health care provider, or the department that is doing the test, when your results will be ready. General instructions  Take over-the-counter and prescription medicines only as told by your health care provider.  Do not use any products that contain nicotine or tobacco. These products include cigarettes, chewing tobacco, and vaping devices, such as e-cigarettes. If you need help quitting, ask your health care provider.  Get mammograms, pelvic exams, and medical checkups as often as told by your health care provider.  Keep all follow-up visits. This is important. Contact a health care provider if you have:  Pain or swelling in your legs.  Lumps or changes in your breasts or armpits.  Pain, burning, or bleeding when you urinate.  Unusual vaginal bleeding.  Dizziness or headaches.  Pain in your abdomen. Get help right away if you have:  Shortness of breath.  Chest pain.  Slurred speech.  Weakness or numbness in any part of your arms or legs. These symptoms may represent a serious problem that is an emergency. Do not wait to see if the symptoms will go away. Get medical help right away. Call your local emergency services (911 in the [place] Do not drive yourself to the hospital. Summary  Menopause is a normal time of life when menstrual periods stop completely and the ovaries stop producing the female hormones estrogen and progesterone.  HRT can reduce the frequency and severity of menopausal symptoms.  Risks of HRT vary depending on your individual health and medical history. This information is not intended to  replace advice given to you by your health care provider. Make sure you discuss any questions you have with your health care provider. Document Revised: 01/25/2020 Document Reviewed: 01/25/2020 Elsevier Patient Education  2021 [employer].   If you have lab work done today you will be contacted with your lab results within the next 2 weeks.  If you have not heard from [place] then please contact us. The fastest way to get your results is to register for My Chart.   IF you received an x-ray today, you will receive an invoice from [facility]. Please contact [facility] at [phone] with questions or concerns regarding your invoice.   IF you received labwork today, you will receive an invoice from [facility]. Please contact [facility] at [phone] with questions or concerns regarding your invoice.   Our billing staff will not be able to assist you with questions regarding bills from these companies.  You will be contacted with the lab results as soon as they are available. The fastest way to get your results is to activate your My Chart account. Instructions are located on the last page of this paperwork. If you have  not heard from [place] regarding the results in 2 weeks, please contact this office.

## 2020-09-22 LAB — CYTOLOGY - PAP
Adequacy: ABSENT
Chlamydia: NEGATIVE
Comment: NEGATIVE
Comment: NEGATIVE
Comment: NEGATIVE
Comment: NEGATIVE
Comment: NORMAL
Diagnosis: NEGATIVE
HSV1: NEGATIVE
HSV2: NEGATIVE
High risk HPV: NEGATIVE
Neisseria Gonorrhea: NEGATIVE
Trichomonas: NEGATIVE

## 2020-11-15 ENCOUNTER — Other Ambulatory Visit: Payer: Self-pay | Admitting: Gastroenterology

## 2020-11-21 ENCOUNTER — Ambulatory Visit: Payer: Self-pay | Admitting: Family Medicine

## 2020-12-23 ENCOUNTER — Telehealth: Payer: Self-pay | Admitting: Family Medicine

## 2020-12-23 ENCOUNTER — Other Ambulatory Visit: Payer: Self-pay

## 2020-12-23 DIAGNOSIS — F410 Panic disorder [episodic paroxysmal anxiety] without agoraphobia: Secondary | ICD-10-CM

## 2020-12-23 DIAGNOSIS — I10 Essential (primary) hypertension: Secondary | ICD-10-CM

## 2020-12-23 MED ORDER — PAROXETINE HCL 10 MG PO TABS: 10.0000 mg | ORAL_TABLET | Freq: Every day | ORAL | 0 refills | Status: DC

## 2020-12-23 MED ORDER — HYDROCHLOROTHIAZIDE 12.5 MG PO CAPS: 12.5000 mg | ORAL_CAPSULE | Freq: Every day | ORAL | 0 refills | Status: DC

## 2020-12-23 NOTE — Telephone Encounter (Signed)
Medication sent to pharmacy  

## 2020-12-23 NOTE — Telephone Encounter (Signed)
..  Medication Refills  Last OV:  Medication:  Hydrochlorothizide & Paxil   Pharmacy:  [facility] on [address]  Let patient know to contact pharmacy at the end of the day to make sure medication is ready.   Please notify patient to allow 48-72 hours to process.  Encourage patient to contact the pharmacy for refills or they can request refills through [place] out below:   Last refill:  QTY:  Refill Date:    Other Comments:  Patient has an appointment scheduled for January 12, 2021   Okay for refill?  Please advise.

## 2021-01-08 ENCOUNTER — Other Ambulatory Visit: Payer: Self-pay | Admitting: Family Medicine

## 2021-01-08 DIAGNOSIS — E039 Hypothyroidism, unspecified: Secondary | ICD-10-CM

## 2021-01-12 ENCOUNTER — Ambulatory Visit: Payer: 59 | Admitting: Family Medicine

## 2021-02-02 ENCOUNTER — Ambulatory Visit: Payer: Self-pay | Admitting: Family Medicine

## 2021-02-16 ENCOUNTER — Other Ambulatory Visit: Payer: Self-pay

## 2021-02-16 ENCOUNTER — Ambulatory Visit (INDEPENDENT_AMBULATORY_CARE_PROVIDER_SITE_OTHER): Payer: Self-pay | Admitting: Family Medicine

## 2021-02-16 VITALS — BP 134/76 | HR 68 | Temp 98.2°F | Resp 15 | Ht 66.0 in | Wt 214.8 lb

## 2021-02-16 DIAGNOSIS — F410 Panic disorder [episodic paroxysmal anxiety] without agoraphobia: Secondary | ICD-10-CM

## 2021-02-16 DIAGNOSIS — R7303 Prediabetes: Secondary | ICD-10-CM

## 2021-02-16 DIAGNOSIS — E785 Hyperlipidemia, unspecified: Secondary | ICD-10-CM

## 2021-02-16 DIAGNOSIS — I10 Essential (primary) hypertension: Secondary | ICD-10-CM

## 2021-02-16 DIAGNOSIS — E039 Hypothyroidism, unspecified: Secondary | ICD-10-CM

## 2021-02-16 MED ORDER — HYDROCHLOROTHIAZIDE 12.5 MG PO CAPS: 12.5000 mg | ORAL_CAPSULE | Freq: Every day | ORAL | 2 refills | Status: DC

## 2021-02-16 MED ORDER — PAROXETINE HCL 10 MG PO TABS: 10.0000 mg | ORAL_TABLET | Freq: Every day | ORAL | 2 refills | Status: DC

## 2021-02-16 MED ORDER — LEVOTHYROXINE SODIUM 150 MCG PO TABS: 150.0000 ug | ORAL_TABLET | Freq: Every day | ORAL | 2 refills | Status: DC

## 2021-02-16 NOTE — Progress Notes (Signed)
Subjective:  Patient ID: Christine [name], female    DOB: May 27, 1961  Age: 60 y.o. MRN: [id number]  CC:  Chief Complaint  Patient presents with   Hypertension    Pt denies physical sxs, pt denies tracking bp at home, needs refill medications today    Hypothyroidism    Pt here today to recheck thyroid and get refill of her medication   Gastroesophageal Reflux    Pt reports no issue with breakthrough sxs, needs refill medications     HPI Christine [name] presents for   Hypertension: Hctz 12.5mg  qd. No new side effects.  No prior cardiac or renal issues known.  Home readings: none.  BP Readings from Last 3 Encounters:  02/16/21 134/76  09/13/20 (!) 145/82  08/10/20 129/64   Lab Results  Component Value Date   CREATININE 0.84 07/19/2020   Hypothyroidism: Lab Results  Component Value Date   TSH 3.750 05/23/2020  Synthroid 150mg  QD.  Taking medication daily.  No new hot or cold intolerance. No new hair or skin changes, heart palpitations or new fatigue.  Has lost weight with diet changes, high calorie foods and avoiding late night meals.  Some exercise with active life and job.  Prediabetes: Wt loss as above.  Lab Results  Component Value Date   HGBA1C 5.9 (H) 05/23/2020   Wt Readings from Last 3 Encounters:  02/16/21 214 lb 12.8 oz (97.4 kg)  09/13/20 228 lb (103.4 kg)  08/10/20 227 lb (103 kg)     Wt Readings from Last 3 Encounters:  02/16/21 214 lb 12.8 oz (97.4 kg)  09/13/20 228 lb (103.4 kg)  08/10/20 227 lb (103 kg)   Generalized anxiety: Paxil 10mg  qd.  Working well for anxiety, panic attacks. No recent symptoms. One week had flair, then resolved.  Alcohol - none.  Depression screen [facility] 2/9 02/16/2021 09/13/2020 05/23/2020 11/16/2019 09/03/2018  Decreased Interest 0 0 0 0 0  Down, Depressed, Hopeless 0 0 0 0 0  PHQ - 2 Score 0 0 0 0 0  Altered sleeping - - - - -  Tired, decreased energy - - - - -  Change in appetite - - - - -  Feeling bad or failure about  yourself  - - - - -  Trouble concentrating - - - - -  Moving slowly or fidgety/restless - - - - -  Suicidal thoughts - - - - -  PHQ-9 Score - - - - -  Difficult doing work/chores - - - - -   No flowsheet data found.  GERD: Endoscopy 08/10/20, Dr. Tarri Glenn.  LA Grade A reflux esophagitis with no bleeding. Biopsied. - Erosive gastropathy with no bleeding and no stigmata of recent bleeding. Biopsied. - Normal examined duodenum. Initial BID ppi for 8 weeks, now on QD dosing.  No breakthrough heartburn on meds.   Hyperlipidemia: Elevated readings prior, low ASCVD score 5.6% No FH of CAD.   Lab Results  Component Value Date   CHOL 283 (H) 05/23/2020   HDL 57 05/23/2020   LDLCALC 183 (H) 05/23/2020   TRIG 228 (H) 05/23/2020   CHOLHDL 5.0 (H) 05/23/2020   Lab Results  Component Value Date   ALT 35 (H) 07/19/2020   AST 19 07/19/2020   ALKPHOS 61 07/19/2020   BILITOT 0.5 07/19/2020   Had covid vaccine and 2 boosters.   History Patient Active Problem List   Diagnosis Date Noted   Essential hypertension 09/05/2015   Hypothyroid 07/02/2012  Anxiety 07/02/2012   BMI 31.0-31.9,adult 07/02/2012   Past Medical History:  Diagnosis Date   Anxiety    GERD (gastroesophageal reflux disease)    HLD (hyperlipidemia)    Hypertension    Hypothyroidism    Past Surgical History:  Procedure Laterality Date   CESAREAN SECTION     COLONOSCOPY     more than 10 years ago   WISDOM TOOTH EXTRACTION     Allergies  Allergen Reactions   Codeine Nausea Only   Prior to Admission medications   Medication Sig Start Date End Date Taking? Authorizing Provider  hydrochlorothiazide (MICROZIDE) 12.5 MG capsule Take 1 capsule (12.5 mg total) by mouth daily. 12/23/20  Yes Wendie Agreste, MD  levothyroxine (SYNTHROID) 150 MCG tablet TAKE 1 TABLET BY MOUTH EVERY DAY 01/09/21  Yes Wendie Agreste, MD  omeprazole (PRILOSEC) 40 MG capsule TAKE 1 CAPSULE (40 MG TOTAL) BY MOUTH DAILY. 11/15/20  Yes  Thornton Park, MD  PARoxetine (PAXIL) 10 MG tablet Take 1 tablet (10 mg total) by mouth daily. 12/23/20  Yes Wendie Agreste, MD   Social History   Socioeconomic History   Marital status: Widowed    Spouse name: Not on file   Number of children: 2   Years of education: Not on file   Highest education level: Not on file  Occupational History   Occupation: Pharmacist, hospital   Occupation: Brewing technologist  Tobacco Use   Smoking status: Former    Types: Cigarettes    Quit date: 04/25/1985    Years since quitting: 35.8   Smokeless tobacco: Never  Vaping Use   Vaping Use: Never used  Substance and Sexual Activity   Alcohol use: No   Drug use: No   Sexual activity: Never  Other Topics Concern   Not on file  Social History Narrative   Not on file   Social Determinants of Health   Financial Resource Strain: Not on file  Food Insecurity: Not on file  Transportation Needs: Not on file  Physical Activity: Not on file  Stress: Not on file  Social Connections: Not on file  Intimate Partner Violence: Not on file    Review of Systems  Constitutional:  Negative for fatigue and unexpected weight change.  Respiratory:  Negative for chest tightness and shortness of breath.   Cardiovascular:  Negative for chest pain, palpitations and leg swelling.  Gastrointestinal:  Negative for abdominal pain and blood in stool.  Neurological:  Negative for dizziness, syncope, light-headedness and headaches.  Psychiatric/Behavioral:  The patient is not nervous/anxious.     Objective:   Vitals:   02/16/21 1442  BP: 134/76  Pulse: 68  Resp: 15  Temp: 98.2 F (36.8 C)  TempSrc: Temporal  SpO2: 96%  Weight: 214 lb 12.8 oz (97.4 kg)  Height: 5\' 6"  (1.676 m)     Physical Exam Vitals reviewed.  Constitutional:      Appearance: Normal appearance. She is well-developed.  HENT:     Head: Normocephalic and atraumatic.  Eyes:     Conjunctiva/sclera: Conjunctivae normal.     Pupils: Pupils are  equal, round, and reactive to light.  Neck:     Vascular: No carotid bruit.     Comments: No thyromegaly/mass.  Cardiovascular:     Rate and Rhythm: Normal rate and regular rhythm.     Heart sounds: Normal heart sounds.  Pulmonary:     Effort: Pulmonary effort is normal.     Breath sounds: Normal breath sounds.  Abdominal:     Palpations: Abdomen is soft. There is no pulsatile mass.     Tenderness: There is no abdominal tenderness.  Musculoskeletal:     Cervical back: Neck supple.     Right lower leg: No edema.     Left lower leg: No edema.  Skin:    General: Skin is warm and dry.  Neurological:     Mental Status: She is alert and oriented to person, place, and time.  Psychiatric:        Mood and Affect: Mood normal.        Behavior: Behavior normal.       Assessment & Plan:  Christine [name] is a 60 y.o. female . Essential hypertension - Plan: Comprehensive metabolic panel, TSH, hydrochlorothiazide (MICROZIDE) 12.5 MG capsule  -  Stable, tolerating current regimen. Medications refilled. Labs pending as above.   Panic attacks - Plan: PARoxetine (PAXIL) 10 MG tablet   - Overall stable, continue Paxil dosing with RTC precautions if recurrence of panic attacks.  Hypothyroidism, unspecified type - Plan: levothyroxine (SYNTHROID) 150 MCG tablet  -Check labs, continue same dose Synthroid, clinically stable.  Hyperlipidemia, unspecified hyperlipidemia type - Plan: Comprehensive metabolic panel, Lipid panel  -Check labs, but anticipate improvement with weight loss.  Commended.  Prediabetes - Plan: Hemoglobin A1c Again expect improvement with weight loss.  Check labs.  Meds ordered this encounter  Medications   levothyroxine (SYNTHROID) 150 MCG tablet    Sig: Take 1 tablet (150 mcg total) by mouth daily.    Dispense:  90 tablet    Refill:  2   PARoxetine (PAXIL) 10 MG tablet    Sig: Take 1 tablet (10 mg total) by mouth daily.    Dispense:  90 tablet    Refill:  2    hydrochlorothiazide (MICROZIDE) 12.5 MG capsule    Sig: Take 1 capsule (12.5 mg total) by mouth daily.    Dispense:  90 capsule    Refill:  2   Patient Instructions  No med changes today. Watch sodium in diet as well.  Great work with the weight loss!  Walking or other low intensity exercise most days per week, 150 minutes per week.   I expect labs to look better. See you in 6 months, but let me know if there are questions sooner.   Managing Your Hypertension Hypertension, also called high blood pressure, is when the force of the blood pressing against the walls of the arteries is too strong. Arteries are blood vessels that carry blood from your heart throughout your body. Hypertension forces the heart to work harder to pump blood and may cause the arteries tobecome narrow or stiff. Understanding blood pressure readings Your personal target blood pressure may vary depending on your medical conditions, your age, and other factors. A blood pressure reading includes a higher number over a lower number. Ideally, your blood pressure should be below 120/80. You should know that: The first, or top, number is called the systolic pressure. It is a measure of the pressure in your arteries as your heart beats. The second, or bottom number, is called the diastolic pressure. It is a measure of the pressure in your arteries as the heart relaxes. Blood pressure is classified into four stages. Based on your blood pressure reading, your health care provider may use the following stages to determine what type of treatment you need, if any. Systolic pressure and diastolicpressure are measured in a unit called mmHg. Normal Systolic pressure:  below 120. Diastolic pressure: below 80. Elevated Systolic pressure: 269-485. Diastolic pressure: below 80. Hypertension stage 1 Systolic pressure: 462-703. Diastolic pressure: 50-09. Hypertension stage 2 Systolic pressure: 381 or above. Diastolic pressure: 90 or  above. How can this condition affect me? Managing your hypertension is an important responsibility. Over time, hypertension can damage the arteries and decrease blood flow to important parts of the body, including the brain, heart, and kidneys. Having untreated or uncontrolled hypertension can lead to: A heart attack. A stroke. A weakened blood vessel (aneurysm). Heart failure. Kidney damage. Eye damage. Metabolic syndrome. Memory and concentration problems. Vascular dementia. What actions can I take to manage this condition? Hypertension can be managed by making lifestyle changes and possibly by taking medicines. Your health care provider will help you make a plan to bring yourblood pressure within a normal range. Nutrition  Eat a diet that is high in fiber and potassium, and low in salt (sodium), added sugar, and fat. An example eating plan is called the Dietary Approaches to Stop Hypertension (DASH) diet. To eat this way: Eat plenty of fresh fruits and vegetables. Try to fill one-half of your plate at each meal with fruits and vegetables. Eat whole grains, such as whole-wheat pasta, brown rice, or whole-grain bread. Fill about one-fourth of your plate with whole grains. Eat low-fat dairy products. Avoid fatty cuts of meat, processed or cured meats, and poultry with skin. Fill about one-fourth of your plate with lean proteins such as fish, chicken without skin, beans, eggs, and tofu. Avoid pre-made and processed foods. These tend to be higher in sodium, added sugar, and fat. Reduce your daily sodium intake. Most people with hypertension should eat less than 1,500 mg of sodium a day.  Lifestyle  Work with your health care provider to maintain a healthy body weight or to lose weight. Ask what an ideal weight is for you. Get at least 30 minutes of exercise that causes your heart to beat faster (aerobic exercise) most days of the week. Activities may include walking, swimming, or  biking. Include exercise to strengthen your muscles (resistance exercise), such as weight lifting, as part of your weekly exercise routine. Try to do these types of exercises for 30 minutes at least 3 days a week. Do not use any products that contain nicotine or tobacco, such as cigarettes, e-cigarettes, and chewing tobacco. If you need help quitting, ask your health care provider. Control any long-term (chronic) conditions you have, such as high cholesterol or diabetes. Identify your sources of stress and find ways to manage stress. This may include meditation, deep breathing, or making time for fun activities.  Alcohol use Do not drink alcohol if: Your health care provider tells you not to drink. You are pregnant, may be pregnant, or are planning to become pregnant. If you drink alcohol: Limit how much you use to: 0-1 drink a day for women. 0-2 drinks a day for men. Be aware of how much alcohol is in your drink. In the [place] one drink equals one 12 oz bottle of beer (355 mL), one 5 oz glass of wine (148 mL), or one 1 oz glass of hard liquor (44 mL). Medicines Your health care provider may prescribe medicine if lifestyle changes are not enough to get your blood pressure under control and if: Your systolic blood pressure is 130 or higher. Your diastolic blood pressure is 80 or higher. Take medicines only as told by your health care provider. Follow the directions carefully. Blood pressure medicines  must be taken as told by your health care provider. The medicine does not work as well when you skip doses. Skippingdoses also puts you at risk for problems. Monitoring Before you monitor your blood pressure: Do not smoke, drink caffeinated beverages, or exercise within 30 minutes before taking a measurement. Use the bathroom and empty your bladder (urinate). Sit quietly for at least 5 minutes before taking measurements. Monitor your blood pressure at home as told by your health care provider. To  do this: Sit with your back straight and supported. Place your feet flat on the floor. Do not cross your legs. Support your arm on a flat surface, such as a table. Make sure your upper arm is at heart level. Each time you measure, take two or three readings one minute apart and record the results. You may also need to have your blood pressure checked regularly by your healthcare provider. General information Talk with your health care provider about your diet, exercise habits, and other lifestyle factors that may be contributing to hypertension. Review all the medicines you take with your health care provider because there may be side effects or interactions. Keep all visits as told by your health care provider. Your health care provider can help you create and adjust your plan for managing your high blood pressure. Where to find more information National Heart, Lung, and Blood Institute: https://wilson-eaton.com/ American Heart Association: www.heart.org Contact a health care provider if: You think you are having a reaction to medicines you have taken. You have repeated (recurrent) headaches. You feel dizzy. You have swelling in your ankles. You have trouble with your vision. Get help right away if: You develop a severe headache or confusion. You have unusual weakness or numbness, or you feel faint. You have severe pain in your chest or abdomen. You vomit repeatedly. You have trouble breathing. These symptoms may represent a serious problem that is an emergency. Do not wait to see if the symptoms will go away. Get medical help right away. Call your local emergency services (911 in the [place] Do not drive yourself to the hospital. Summary Hypertension is when the force of blood pumping through your arteries is too strong. If this condition is not controlled, it may put you at risk for serious complications. Your personal target blood pressure may vary depending on your medical conditions, your  age, and other factors. For most people, a normal blood pressure is less than 120/80. Hypertension is managed by lifestyle changes, medicines, or both. Lifestyle changes to help manage hypertension include losing weight, eating a healthy, low-sodium diet, exercising more, stopping smoking, and limiting alcohol. This information is not intended to replace advice given to you by your health care provider. Make sure you discuss any questions you have with your healthcare provider. Document Revised: 08/28/2019 Document Reviewed: 06/23/2019 Elsevier Patient Education  2022 [place],   Merri Ray, MD [facility] 02/18/21 12:45 PM

## 2021-02-16 NOTE — Patient Instructions (Addendum)
No med changes today. Watch sodium in diet as well.  Great work with the weight loss!  Walking or other low intensity exercise most days per week, 150 minutes per week.   I expect labs to look better. See you in 6 months, but let me know if there are questions sooner.   Managing Your Hypertension Hypertension, also called high blood pressure, is when the force of the blood pressing against the walls of the arteries is too strong. Arteries are blood vessels that carry blood from your heart throughout your body. Hypertension forces the heart to work harder to pump blood and may cause the arteries tobecome narrow or stiff. Understanding blood pressure readings Your personal target blood pressure may vary depending on your medical conditions, your age, and other factors. A blood pressure reading includes a higher number over a lower number. Ideally, your blood pressure should be below 120/80. You should know that: The first, or top, number is called the systolic pressure. It is a measure of the pressure in your arteries as your heart beats. The second, or bottom number, is called the diastolic pressure. It is a measure of the pressure in your arteries as the heart relaxes. Blood pressure is classified into four stages. Based on your blood pressure reading, your health care provider may use the following stages to determine what type of treatment you need, if any. Systolic pressure and diastolicpressure are measured in a unit called mmHg. Normal Systolic pressure: below 364. Diastolic pressure: below 80. Elevated Systolic pressure: 680-321. Diastolic pressure: below 80. Hypertension stage 1 Systolic pressure: 224-825. Diastolic pressure: 00-37. Hypertension stage 2 Systolic pressure: 048 or above. Diastolic pressure: 90 or above. How can this condition affect me? Managing your hypertension is an important responsibility. Over time, hypertension can damage the arteries and decrease blood flow to  important parts of the body, including the brain, heart, and kidneys. Having untreated or uncontrolled hypertension can lead to: A heart attack. A stroke. A weakened blood vessel (aneurysm). Heart failure. Kidney damage. Eye damage. Metabolic syndrome. Memory and concentration problems. Vascular dementia. What actions can I take to manage this condition? Hypertension can be managed by making lifestyle changes and possibly by taking medicines. Your health care provider will help you make a plan to bring yourblood pressure within a normal range. Nutrition  Eat a diet that is high in fiber and potassium, and low in salt (sodium), added sugar, and fat. An example eating plan is called the Dietary Approaches to Stop Hypertension (DASH) diet. To eat this way: Eat plenty of fresh fruits and vegetables. Try to fill one-half of your plate at each meal with fruits and vegetables. Eat whole grains, such as whole-wheat pasta, brown rice, or whole-grain bread. Fill about one-fourth of your plate with whole grains. Eat low-fat dairy products. Avoid fatty cuts of meat, processed or cured meats, and poultry with skin. Fill about one-fourth of your plate with lean proteins such as fish, chicken without skin, beans, eggs, and tofu. Avoid pre-made and processed foods. These tend to be higher in sodium, added sugar, and fat. Reduce your daily sodium intake. Most people with hypertension should eat less than 1,500 mg of sodium a day.  Lifestyle  Work with your health care provider to maintain a healthy body weight or to lose weight. Ask what an ideal weight is for you. Get at least 30 minutes of exercise that causes your heart to beat faster (aerobic exercise) most days of the week. Activities may  include walking, swimming, or biking. Include exercise to strengthen your muscles (resistance exercise), such as weight lifting, as part of your weekly exercise routine. Try to do these types of exercises for 30  minutes at least 3 days a week. Do not use any products that contain nicotine or tobacco, such as cigarettes, e-cigarettes, and chewing tobacco. If you need help quitting, ask your health care provider. Control any long-term (chronic) conditions you have, such as high cholesterol or diabetes. Identify your sources of stress and find ways to manage stress. This may include meditation, deep breathing, or making time for fun activities.  Alcohol use Do not drink alcohol if: Your health care provider tells you not to drink. You are pregnant, may be pregnant, or are planning to become pregnant. If you drink alcohol: Limit how much you use to: 0-1 drink a day for women. 0-2 drinks a day for men. Be aware of how much alcohol is in your drink. In the [place] one drink equals one 12 oz bottle of beer (355 mL), one 5 oz glass of wine (148 mL), or one 1 oz glass of hard liquor (44 mL). Medicines Your health care provider may prescribe medicine if lifestyle changes are not enough to get your blood pressure under control and if: Your systolic blood pressure is 130 or higher. Your diastolic blood pressure is 80 or higher. Take medicines only as told by your health care provider. Follow the directions carefully. Blood pressure medicines must be taken as told by your health care provider. The medicine does not work as well when you skip doses. Skippingdoses also puts you at risk for problems. Monitoring Before you monitor your blood pressure: Do not smoke, drink caffeinated beverages, or exercise within 30 minutes before taking a measurement. Use the bathroom and empty your bladder (urinate). Sit quietly for at least 5 minutes before taking measurements. Monitor your blood pressure at home as told by your health care provider. To do this: Sit with your back straight and supported. Place your feet flat on the floor. Do not cross your legs. Support your arm on a flat surface, such as a table. Make sure your  upper arm is at heart level. Each time you measure, take two or three readings one minute apart and record the results. You may also need to have your blood pressure checked regularly by your healthcare provider. General information Talk with your health care provider about your diet, exercise habits, and other lifestyle factors that may be contributing to hypertension. Review all the medicines you take with your health care provider because there may be side effects or interactions. Keep all visits as told by your health care provider. Your health care provider can help you create and adjust your plan for managing your high blood pressure. Where to find more information National Heart, Lung, and Blood Institute: https://wilson-eaton.com/ American Heart Association: www.heart.org Contact a health care provider if: You think you are having a reaction to medicines you have taken. You have repeated (recurrent) headaches. You feel dizzy. You have swelling in your ankles. You have trouble with your vision. Get help right away if: You develop a severe headache or confusion. You have unusual weakness or numbness, or you feel faint. You have severe pain in your chest or abdomen. You vomit repeatedly. You have trouble breathing. These symptoms may represent a serious problem that is an emergency. Do not wait to see if the symptoms will go away. Get medical help right away. Call your  local emergency services (911 in the [place] Do not drive yourself to the hospital. Summary Hypertension is when the force of blood pumping through your arteries is too strong. If this condition is not controlled, it may put you at risk for serious complications. Your personal target blood pressure may vary depending on your medical conditions, your age, and other factors. For most people, a normal blood pressure is less than 120/80. Hypertension is managed by lifestyle changes, medicines, or both. Lifestyle changes to help  manage hypertension include losing weight, eating a healthy, low-sodium diet, exercising more, stopping smoking, and limiting alcohol. This information is not intended to replace advice given to you by your health care provider. Make sure you discuss any questions you have with your healthcare provider. Document Revised: 08/28/2019 Document Reviewed: 06/23/2019 Elsevier Patient Education  2022 [employer].

## 2021-02-18 ENCOUNTER — Encounter: Payer: Self-pay | Admitting: Family Medicine

## 2021-03-03 ENCOUNTER — Emergency Department (HOSPITAL_COMMUNITY)
Admission: EM | Admit: 2021-03-03 | Discharge: 2021-03-03 | Disposition: A | Discharge status: Home / Self Care | Payer: Self-pay | Attending: Emergency Medicine | Admitting: Emergency Medicine

## 2021-03-03 ENCOUNTER — Other Ambulatory Visit: Payer: Self-pay

## 2021-03-03 ENCOUNTER — Telehealth: Payer: Self-pay | Admitting: Family Medicine

## 2021-03-03 ENCOUNTER — Emergency Department (HOSPITAL_COMMUNITY): Payer: Self-pay

## 2021-03-03 DIAGNOSIS — Z87891 Personal history of nicotine dependence: Secondary | ICD-10-CM | POA: Insufficient documentation

## 2021-03-03 DIAGNOSIS — I10 Essential (primary) hypertension: Secondary | ICD-10-CM | POA: Insufficient documentation

## 2021-03-03 DIAGNOSIS — R0602 Shortness of breath: Secondary | ICD-10-CM | POA: Insufficient documentation

## 2021-03-03 DIAGNOSIS — L237 Allergic contact dermatitis due to plants, except food: Secondary | ICD-10-CM | POA: Insufficient documentation

## 2021-03-03 DIAGNOSIS — E039 Hypothyroidism, unspecified: Secondary | ICD-10-CM | POA: Insufficient documentation

## 2021-03-03 DIAGNOSIS — R002 Palpitations: Secondary | ICD-10-CM | POA: Insufficient documentation

## 2021-03-03 DIAGNOSIS — Z79899 Other long term (current) drug therapy: Secondary | ICD-10-CM | POA: Insufficient documentation

## 2021-03-03 DIAGNOSIS — R42 Dizziness and giddiness: Secondary | ICD-10-CM | POA: Insufficient documentation

## 2021-03-03 DIAGNOSIS — R079 Chest pain, unspecified: Secondary | ICD-10-CM

## 2021-03-03 DIAGNOSIS — R072 Precordial pain: Secondary | ICD-10-CM | POA: Insufficient documentation

## 2021-03-03 LAB — TROPONIN I (HIGH SENSITIVITY)
Troponin I (High Sensitivity): 2 ng/L (ref <18)
Troponin I (High Sensitivity): 2 ng/L (ref <18)

## 2021-03-03 LAB — CBC
HCT: 42.2 % (ref 36.0–46.0)
Hemoglobin: 13.8 g/dL (ref 12.0–15.0)
MCH: 28.6 pg (ref 26.0–34.0)
MCHC: 32.7 g/dL (ref 30.0–36.0)
MCV: 87.6 fL (ref 80.0–100.0)
Platelets: 245 10*3/uL (ref 150–400)
RBC: 4.82 MIL/uL (ref 3.87–5.11)
RDW: 13 % (ref 11.5–15.5)
WBC: 5.9 10*3/uL (ref 4.0–10.5)
nRBC: 0 % (ref 0.0–0.2)

## 2021-03-03 LAB — BASIC METABOLIC PANEL
Anion gap: 8 (ref 5–15)
BUN: 22 mg/dL — ABNORMAL HIGH (ref 6–20)
CO2: 24 mmol/L (ref 22–32)
Calcium: 9.6 mg/dL (ref 8.9–10.3)
Chloride: 106 mmol/L (ref 98–111)
Creatinine, Ser: 0.63 mg/dL (ref 0.44–1.00)
GFR, Estimated: >60 mL/min (ref >60)
Glucose, Bld: 102 mg/dL — ABNORMAL HIGH (ref 70–99)
Potassium: 4.2 mmol/L (ref 3.5–5.1)
Sodium: 138 mmol/L (ref 135–145)

## 2021-03-03 LAB — I-STAT BETA HCG BLOOD, ED (MC, WL, AP ONLY): I-stat hCG, quantitative: <5 m[IU]/mL (ref <5)

## 2021-03-03 MED ORDER — TRIAMCINOLONE ACETONIDE 0.1 % EX CREA: 1.0000 "application " | TOPICAL_CREAM | Freq: Two times a day (BID) | CUTANEOUS | 0 refills | Status: DC

## 2021-03-03 NOTE — Telephone Encounter (Signed)
Patient wanted to communicate with someone familiar with her medical history.  Patient is still in the [facility] room - please advise

## 2021-03-03 NOTE — ED Provider Notes (Signed)
[facility] DEPT Provider Note   CSN: [id number] Arrival date & time: 03/03/21  1102     History Chief Complaint  Patient presents with   Chest Pain    Christine [name] is a 60 y.o. female.  She is here for evaluation of rapid heart rate chest tightness shortness of breath dizziness that started last evening.  Its mostly improved today she still feels a little chest tightness.  She has had these symptoms before that she associates with her anxiety is here.  Has not happened in a few months although they did just reduce her anxiety medication recently.  No history of coronary disease.   The history is provided by the patient.  Chest Pain Pain location:  Substernal area Pain quality: tightness   Pain radiates to:  Does not radiate Pain severity:  Moderate Onset quality:  Sudden Duration:  2 days Timing:  Intermittent Progression:  Partially resolved Chronicity:  Recurrent Relieved by:  Rest Worsened by:  Nothing Ineffective treatments:  None tried Associated symptoms: dizziness, palpitations and shortness of breath   Associated symptoms: no abdominal pain, no cough, no diaphoresis, no fever, no lower extremity edema, no syncope and no vomiting       Past Medical History:  Diagnosis Date   Anxiety    GERD (gastroesophageal reflux disease)    HLD (hyperlipidemia)    Hypertension    Hypothyroidism     Patient Active Problem List   Diagnosis Date Noted   Essential hypertension 09/05/2015   Hypothyroid 07/02/2012   Anxiety 07/02/2012   BMI 31.0-31.9,adult 07/02/2012    Past Surgical History:  Procedure Laterality Date   CESAREAN SECTION     COLONOSCOPY     more than 10 years ago   WISDOM TOOTH EXTRACTION       OB History   No obstetric history on file.     Family History  Problem Relation Age of Onset   Irritable bowel syndrome Mother    Diabetes Mother    Hyperlipidemia Mother    Liver cancer Mother        unsure of original  site   Colon polyps Sister    Breast cancer Maternal Grandmother    Clotting disorder Nephew    Diabetes Nephew    Colon cancer Neg Hx    Esophageal cancer Neg Hx    Rectal cancer Neg Hx    Stomach cancer Neg Hx     Social History   Tobacco Use   Smoking status: Former    Types: Cigarettes    Quit date: 04/25/1985    Years since quitting: 35.8   Smokeless tobacco: Never  Vaping Use   Vaping Use: Never used  Substance Use Topics   Alcohol use: No   Drug use: No    Home Medications Prior to Admission medications   Medication Sig Start Date End Date Taking? Authorizing Provider  hydrochlorothiazide (MICROZIDE) 12.5 MG capsule Take 1 capsule (12.5 mg total) by mouth daily. 02/16/21   Wendie Agreste, MD  levothyroxine (SYNTHROID) 150 MCG tablet Take 1 tablet (150 mcg total) by mouth daily. 02/16/21   Wendie Agreste, MD  omeprazole (PRILOSEC) 40 MG capsule TAKE 1 CAPSULE (40 MG TOTAL) BY MOUTH DAILY. 11/15/20   Thornton Park, MD  PARoxetine (PAXIL) 10 MG tablet Take 1 tablet (10 mg total) by mouth daily. 02/16/21   Wendie Agreste, MD    Allergies    Codeine  Review of Systems  Review of Systems  Constitutional:  Negative for diaphoresis and fever.  HENT:  Negative for sore throat.   Eyes:  Negative for visual disturbance.  Respiratory:  Positive for shortness of breath. Negative for cough.   Cardiovascular:  Positive for chest pain and palpitations. Negative for syncope.  Gastrointestinal:  Negative for abdominal pain and vomiting.  Genitourinary:  Negative for dysuria.  Musculoskeletal:  Negative for neck pain.  Skin:  Positive for rash (poison ivy arms).  Neurological:  Positive for dizziness and light-headedness.   Physical Exam Updated Vital Signs BP 133/79   Pulse 68   Temp 98.3 F (36.8 C) (Oral)   Resp 18   SpO2 99%   Physical Exam Vitals and nursing note reviewed.  Constitutional:      General: She is not in acute distress.    Appearance:  Normal appearance. She is well-developed.  HENT:     Head: Normocephalic and atraumatic.  Eyes:     Conjunctiva/sclera: Conjunctivae normal.  Cardiovascular:     Rate and Rhythm: Normal rate and regular rhythm.     Heart sounds: Normal heart sounds. No murmur heard. Pulmonary:     Effort: Pulmonary effort is normal. No respiratory distress.     Breath sounds: Normal breath sounds.  Abdominal:     Palpations: Abdomen is soft.     Tenderness: There is no abdominal tenderness.  Musculoskeletal:        General: Normal range of motion.     Cervical back: Neck supple.     Right lower leg: No tenderness. No edema.     Left lower leg: No tenderness. No edema.  Skin:    General: Skin is warm and dry.     Findings: Rash (Poison ivy on arms) present.  Neurological:     General: No focal deficit present.     Mental Status: She is alert.    ED Results / Procedures / Treatments   Labs (all labs ordered are listed, but only abnormal results are displayed) Labs Reviewed  BASIC METABOLIC PANEL - Abnormal; Notable for the following components:      Result Value   Glucose, Bld 102 (*)    BUN 22 (*)    All other components within normal limits  CBC  I-STAT BETA HCG BLOOD, ED ([facility], [facility], [facility] ONLY)  TROPONIN I (HIGH SENSITIVITY)  TROPONIN I (HIGH SENSITIVITY)    EKG EKG Interpretation  Date/Time:  Friday March 03 2021 11:24:43 EDT Ventricular Rate:  69 PR Interval:  140 QRS Duration: 78 QT Interval:  386 QTC Calculation: 413 R Axis:   0 Text Interpretation: Normal sinus rhythm Cannot rule out Anterior infarct , age undetermined Abnormal ECG No significant change since prior 9/08 Confirmed by Aletta Edouard [phone]) on 03/03/2021 1:17:40 PM  Radiology DG Chest 2 View  Result Date: 03/03/2021 CLINICAL DATA:  Chest pain EXAM: CHEST - 2 VIEW COMPARISON:  None. FINDINGS: The heart size and mediastinal contours are within normal limits. Both lungs are clear. The visualized skeletal structures  are unremarkable. IMPRESSION: No active cardiopulmonary disease. Electronically Signed   By: Franchot Gallo M.D.   On: 03/03/2021 12:30    Procedures Procedures   Medications Ordered in ED Medications - No data to display  ED Course  I have reviewed the triage vital signs and the nursing notes.  Pertinent labs & imaging results that were available during my care of the patient were reviewed by me and considered in my medical decision making (  see chart for details).    MDM Rules/Calculators/A&P                          This patient complains of palpitations chest pain shortness of breath; this involves an extensive number of treatment Options and is a complaint that carries with it a high risk of complications and Morbidity. The differential includes ACS, pneumonia, PE, pneumothorax, anxiety,, arrhythmia  I ordered, reviewed and interpreted labs, which included CBC with normal white count normal hemoglobin, chemistries normal, troponins flat, pregnancy test negative  I ordered imaging studies which included chest x-ray and I independently    visualized and interpreted imaging which showed no acute findings Previous records obtained and reviewed in epic no recent admissions  After the interventions stated above, I reevaluated the patient and found patient currently minimally symptomatic with stable vitals satting 97-100% on room air.  Currently no indication for admission.  Patient already has follow-up with PCP scheduled.  Return instructions discussed   Final Clinical Impression(s) / ED Diagnoses Final diagnoses:  Nonspecific chest pain  Poison ivy dermatitis    Rx / DC Orders ED Discharge Orders          Ordered    triamcinolone cream (KENALOG) 0.1 %  2 times daily        03/03/21 1610             Hayden Rasmussen, MD 03/03/21 1816

## 2021-03-03 NOTE — Telephone Encounter (Signed)
Certainly could be anxiety related, and may be better off at higher dose of Paxil, but can discuss plan with ER first, and follow-up with me early next week to discuss med changes if needed.  Virtual visit if needed..  Thanks for the information.

## 2021-03-03 NOTE — Telephone Encounter (Signed)
Called patient with provider recommendations. Patient voiced understanding and scheduled to come in next week

## 2021-03-03 NOTE — ED Provider Notes (Signed)
Emergency Medicine Provider Triage Evaluation Note  Christine [name] , a 60 y.o. female  was evaluated in triage.  Pt complains of anxiety and panic with recent reduction in her Paxil dose who presents with chest tightness and shortness of breath.  She had a similar episode last week but was able to deep breathe and it went away.  She has had persistent chest pressure since 4 PM last night.  She states "I just want to make sure this is not my heart."  She has a history of hypertension but denies hyperlipidemia, diabetes or primary family member with history of MI.  She does not smoke.  Review of Systems  Positive: Chest tightness Negative: Chest pain, nausea, vomiting or diaphoresis  Physical Exam  BP 140/69 (BP Location: Left Arm)   Pulse 68   Temp 98.3 F (36.8 C) (Oral)   Resp 16   SpO2 97%  Gen:   Awake, no distress   Resp:  Normal effort  MSK:   Moves extremities without difficulty  Other:    Medical Decision Making  Medically screening exam initiated at 11:30 AM.  Appropriate orders placed.  Christine [name] was informed that the remainder of the evaluation will be completed by another provider, this initial triage assessment does not replace that evaluation, and the importance of remaining in the ED until their evaluation is complete.  CP work up initiated. EKG NSR rate 69- No STEMI   Margarita Mail, PA-C 03/03/21 1132    Wyvonnia Dusky, MD 03/03/21 1745

## 2021-03-03 NOTE — Discharge Instructions (Addendum)
You are seen in the emergency department for evaluation of chest pain.  You had blood work EKG and a chest x-ray that did not show any significant abnormalities.  Please contact your primary care doctor for close follow-up.  Return to the emergency department if any worsening or concerning symptoms.

## 2021-03-03 NOTE — ED Triage Notes (Signed)
Pt endorses chest pressure and SHOB that began yesterday evening. Denies N/V and abdominal pain. Pt recently decreased does of Paxil. Pt states this could be a panic attack, but wants to make sure.

## 2021-03-06 ENCOUNTER — Other Ambulatory Visit: Payer: Self-pay

## 2021-03-06 ENCOUNTER — Telehealth (INDEPENDENT_AMBULATORY_CARE_PROVIDER_SITE_OTHER): Payer: Self-pay | Admitting: Family Medicine

## 2021-03-06 VITALS — Wt 211.0 lb

## 2021-03-06 DIAGNOSIS — R0789 Other chest pain: Secondary | ICD-10-CM

## 2021-03-06 DIAGNOSIS — F410 Panic disorder [episodic paroxysmal anxiety] without agoraphobia: Secondary | ICD-10-CM

## 2021-03-06 MED ORDER — ALPRAZOLAM 0.5 MG PO TABS: 0.2500 mg | ORAL_TABLET | Freq: Two times a day (BID) | ORAL | 0 refills | Status: DC | PRN

## 2021-03-06 NOTE — Progress Notes (Signed)
Virtual Visit via audio Note  I connected with Christine [name] on 03/06/21 at 12:10 PM after 10 min chart review  by audio (multiple attempts by video - her phone would not allow her to enable microphone or camera). Verified that I am speaking with the correct person using two identifiers.  Patient location: work - Designer, television/film set.  My location: office [place]   I discussed the limitations, risks, security and privacy concerns of performing an evaluation and management service by telephone and the availability of in person appointments. I also discussed with the patient that there may be a patient responsible charge related to this service. The patient expressed understanding and agreed to proceed, consent obtained  Chief complaint:  Chief Complaint  Patient presents with   Hospitalization Follow-up    Pt notes Thursday 7/28 4pm started having some chest tightness SOB and palpitations talked with sister who gave her Xanax tablet which notes stopped sxs, sxs resumed Friday morning (03/03/21) though pt notes less severe, notes she then went to Urgent care who advised ED, ED ruled out heart attack and discharged later that afternoon    History of Present Illness: Christine [name] is a 60 y.o. female  Hospital/ER follow-up Last visit with me July 14.  Doing well at that time without breakthrough heartburn on PPI.  Blood pressure stable at that time as well as her hypothyroidism.  Anxiety had been overall controlled with Paxil 10 mg daily(decrease from '20mg'$  in October 2021 as well controlled).   Did note she had had a flare of anxiety/panic symptoms for 1 week that had resolved.  Was continued on same regimen of '10mg'$  .    See my chart message, has some associated increased heart rate and palpitations.  Improved with rest but then returned later that night.  Treated with Xanax at home with some relief.  Restarted following day, went to urgent care.Evaluated July 29 through urgent care, then seen through  [facility] emergency room.  Noted chest tightness at that time, thought to be associated with anxiety. Troponin negative x2, CBC was normal.  Borderline glucose of 102 but otherwise BMP normal.  Chest x-ray with no active cardiopulmonary disease.  EKG without significant change since prior EKG.   Has felt fine since leaving ER. No recurrence of chest tightness, no return of anxiety symptoms. Does not want to return to higher dose of paxil - more energy on current dose. Was stressed from son having bar out from [place] surgery and worried about him. Has not been exercising or other techniques. Only 2 panic attacks in past year.      Patient Active Problem List   Diagnosis Date Noted   Essential hypertension 09/05/2015   Hypothyroid 07/02/2012   Anxiety 07/02/2012   BMI 31.0-31.9,adult 07/02/2012   Past Medical History:  Diagnosis Date   Anxiety    GERD (gastroesophageal reflux disease)    HLD (hyperlipidemia)    Hypertension    Hypothyroidism    Past Surgical History:  Procedure Laterality Date   CESAREAN SECTION     COLONOSCOPY     more than 10 years ago   WISDOM TOOTH EXTRACTION     Allergies  Allergen Reactions   Codeine Nausea Only   Prior to Admission medications   Medication Sig Start Date End Date Taking? Authorizing Provider  hydrochlorothiazide (MICROZIDE) 12.5 MG capsule Take 1 capsule (12.5 mg total) by mouth daily. 02/16/21  Yes Wendie Agreste, MD  levothyroxine (SYNTHROID) 150 MCG tablet  Take 1 tablet (150 mcg total) by mouth daily. 02/16/21  Yes Wendie Agreste, MD  omeprazole (PRILOSEC) 40 MG capsule TAKE 1 CAPSULE (40 MG TOTAL) BY MOUTH DAILY. 11/15/20  Yes Thornton Park, MD  PARoxetine (PAXIL) 10 MG tablet Take 1 tablet (10 mg total) by mouth daily. 02/16/21  Yes Wendie Agreste, MD  triamcinolone cream (KENALOG) 0.1 % Apply 1 application topically 2 (two) times daily. 03/03/21  Yes Hayden Rasmussen, MD   Social History   Socioeconomic History    Marital status: Widowed    Spouse name: Not on file   Number of children: 2   Years of education: Not on file   Highest education level: Not on file  Occupational History   Occupation: Pharmacist, hospital   Occupation: Brewing technologist  Tobacco Use   Smoking status: Former    Types: Cigarettes    Quit date: 04/25/1985    Years since quitting: 35.8   Smokeless tobacco: Never  Vaping Use   Vaping Use: Never used  Substance and Sexual Activity   Alcohol use: No   Drug use: No   Sexual activity: Never  Other Topics Concern   Not on file  Social History Narrative   Not on file   Social Determinants of Health   Financial Resource Strain: Not on file  Food Insecurity: Not on file  Transportation Needs: Not on file  Physical Activity: Not on file  Stress: Not on file  Social Connections: Not on file  Intimate Partner Violence: Not on file    Observations/Objective: Vitals:   03/06/21 1154  Weight: 211 lb (95.7 kg)  Euthymic mood based on discussion on phone, speaking in full sentences, no distress.  Coherent responses.  Appropriate responses.  Assessment and Plan: Anxiety attack - Plan: ALPRAZolam (XANAX) 0.5 MG tablet  Chest tightness  -Suspected panic/anxiety attack with situational stressors.  ER work-up reassuring including negative troponins, no significant changes noted with EKG and reassuring chest x-ray.  Treatment options discussed but would like to remain on same dose Paxil as infrequent symptoms, overall feels better on lower dose, and does have some room to change her coping techniques.   - Did agree to prescribe short-term alprazolam if needed for breakthrough symptoms but risks were discussed and if frequent/ongoing need would need to change to different class of of meds.  Understanding expressed  ER/RTC precautions  Follow Up Instructions: As needed with ER precautions given.   I discussed the assessment and treatment plan with the patient. The patient was provided an  opportunity to ask questions and all were answered. The patient agreed with the plan and demonstrated an understanding of the instructions.   The patient was advised to call back or seek an in-person evaluation if the symptoms worsen or if the condition fails to improve as anticipated.  I provided 19 minutes of non-face-to-face time during this encounter.   Wendie Agreste, MD

## 2021-03-06 NOTE — Patient Instructions (Addendum)
I agree - we can continue same dose of paxil. See info on stress management below. If needed, I did prescribe xanax to have on hand if needed. If you do require that medication more than just rare panic/anxiety attack then will need to look at other options. Please let me know if there are other questions.   Textbook of family medicine (9th ed., pp. 0277-4128). [place], [place]: [employer].">   Managing Anxiety, Adult After being diagnosed with an anxiety disorder, you may be relieved to know why you have felt or behaved a certain way. You may also feel overwhelmed about the treatment ahead and what it will mean for your life. With care and support, youcan manage this condition and recover from it. How to manage lifestyle changes Managing stress and anxiety  Stress is your body's reaction to life changes and events, both good and bad. Most stress will last just a few hours, but stress can be ongoing and can lead to more than just stress. Although stress can play a major role in anxiety, it is not the same as anxiety. Stress is usually caused by something external, such as a deadline, test, or competition. Stress normally passes after thetriggering event has ended.  Anxiety is caused by something internal, such as imagining a terrible outcome or worrying that something will go wrong that will devastate you. Anxiety often does not go away even after the triggering event is over, and it can become long-term (chronic) worry. It is important to understand the differences between stress and anxiety and to manage your stress effectively so that it does not lead to ananxious response. Talk with your health care provider or a counselor to learn more about reducing anxiety and stress. He or she may suggest tension reduction techniques, such as: Music therapy. This can include creating or listening to music that you enjoy and that inspires you. Mindfulness-based meditation. This involves being aware of your normal  breaths while not trying to control your breathing. It can be done while sitting or walking. Centering prayer. This involves focusing on a word, phrase, or sacred image that means something to you and brings you peace. Deep breathing. To do this, expand your stomach and inhale slowly through your nose. Hold your breath for 3-5 seconds. Then exhale slowly, letting your stomach muscles relax. Self-talk. This involves identifying thought patterns that lead to anxiety reactions and changing those patterns. Muscle relaxation. This involves tensing muscles and then relaxing them. Choose a tension reduction technique that suits your lifestyle and personality. These techniques take time and practice. Set aside 5-15 minutes a day to do them. Therapists can offer counseling and training in these techniques. The training to help with anxiety may be covered by some insurance plans. Other things you can do to manage stress and anxiety include: Keeping a stress/anxiety diary. This can help you learn what triggers your reaction and then learn ways to manage your response. Thinking about how you react to certain situations. You may not be able to control everything, but you can control your response. Making time for activities that help you relax and not feeling guilty about spending your time in this way. Visual imagery and yoga can help you stay calm and relax.  Medicines Medicines can help ease symptoms. Medicines for anxiety include: Anti-anxiety drugs. Antidepressants. Medicines are often used as a primary treatment for anxiety disorder. Medicines will be prescribed by a health care provider. When used together, medicines, psychotherapy, and tension reduction techniques may be  the most effectivetreatment. Relationships Relationships can play a big part in helping you recover. Try to spend more time connecting with trusted friends and family members. Consider going to couples counseling, taking family education  classes, or going to familytherapy. Therapy can help you and others better understand your condition. How to recognize changes in your anxiety Everyone responds differently to treatment for anxiety. Recovery from anxiety happens when symptoms decrease and stop interfering with your daily activities at home or work. This may mean that you will start to: Have better concentration and focus. Worry will interfere less in your daily thinking. Sleep better. Be less irritable. Have more energy. Have improved memory. It is important to recognize when your condition is getting worse. Contact your health care provider if your symptoms interfere with home or work and you feellike your condition is not improving. Follow these instructions at home: Activity Exercise. Most adults should do the following: Exercise for at least 150 minutes each week. The exercise should increase your heart rate and make you sweat (moderate-intensity exercise). Strengthening exercises at least twice a week. Get the right amount and quality of sleep. Most adults need 7-9 hours of sleep each night. Lifestyle  Eat a healthy diet that includes plenty of vegetables, fruits, whole grains, low-fat dairy products, and lean protein. Do not eat a lot of foods that are high in solid fats, added sugars, or salt. Make choices that simplify your life. Do not use any products that contain nicotine or tobacco, such as cigarettes, e-cigarettes, and chewing tobacco. If you need help quitting, ask your health care provider. Avoid caffeine, alcohol, and certain over-the-counter cold medicines. These may make you feel worse. Ask your pharmacist which medicines to avoid.  General instructions Take over-the-counter and prescription medicines only as told by your health care provider. Keep all follow-up visits as told by your health care provider. This is important. Where to find support You can get help and support from these sources: Self-help  groups. Online and [employer]. A trusted spiritual leader. Couples counseling. Family education classes. Family therapy. Where to find more information You may find that joining a support group helps you deal with your anxiety. The following sources can help you locate counselors or support groups near you: [place]: www.mentalhealthamerica.net Anxiety and Depression Association of [place] (ADAA): https://www.clark.net/ National Alliance on Mental Illness (NAMI): www.nami.org Contact a health care provider if you: Have a hard time staying focused or finishing daily tasks. Spend many hours a day feeling worried about everyday life. Become exhausted by worry. Start to have headaches, feel tense, or have nausea. Urinate more than normal. Have diarrhea. Get help right away if you have: A racing heart and shortness of breath. Thoughts of hurting yourself or others. If you ever feel like you may hurt yourself or others, or have thoughts about taking your own life, get help right away. You can go to your nearest emergency department or call: Your local emergency services (911 in the [place] A suicide crisis helpline, such as the [facility] at 409-343-4119. This is open 24 hours a day. Summary Taking steps to learn and use tension reduction techniques can help calm you and help prevent triggering an anxiety reaction. When used together, medicines, psychotherapy, and tension reduction techniques may be the most effective treatment. Family, friends, and partners can play a big part in helping you recover from an anxiety disorder. This information is not intended to replace advice given to you by your  health care provider. Make sure you discuss any questions you have with your healthcare provider. Document Revised: 12/23/2018 Document Reviewed: 12/23/2018 Elsevier Patient Education  2022 [place], Adult Stress is a normal reaction to  life events. Stress is what you feel when life demands more than you are used to, or more than you think you can handle. Some stress can be useful, such as studying for a test or meeting a deadline at work. Stress that occurs too often or for too long can cause problems. It can affect your emotional health and interfere with relationships and normal daily activities. Too much stress can weaken your body's defense system (immune system) and increase your risk for physical illness. If you already have a medicalproblem, stress can make it worse. What are the causes? All sorts of life events can cause stress. An event that causes stress for one person may not be stressful for another person. Major life events, whether positive or negative, commonly cause stress. Examples include: Losing a job or starting a new job. Losing a loved one. Moving to a new town or home. Getting married or divorced. Having a baby. Getting injured or sick. Less obvious life events can also cause stress, especially if they occur day after day or in combination with each other. Examples include: Working long hours. Driving in traffic. Caring for children. Being in debt. Being in a difficult relationship. What are the signs or symptoms? Stress can cause emotional symptoms, including: Anxiety. This is feeling worried, afraid, on edge, overwhelmed, or out of control. Anger, including irritation or impatience. Depression. This is feeling sad, down, helpless, or guilty. Trouble focusing, remembering, or making decisions. Stress can cause physical symptoms, including: Aches and pains. These may affect your head, neck, back, stomach, or other areas of your body. Tight muscles or a clenched jaw. Low energy. Trouble sleeping. Stress can cause unhealthy behaviors, including: Eating to feel better (overeating) or skipping meals. Working too much or putting off tasks. Smoking, drinking alcohol, or using drugs to feel better. How  is this diagnosed? Stress is diagnosed through an assessment by your health care provider. He or she may diagnose this condition based on: Your symptoms and any stressful life events. Your medical history. Tests to rule out other causes of your symptoms. Depending on your condition, your health care provider may refer you to aspecialist for further evaluation. How is this treated?  Stress management techniques are the recommended treatment for stress. Medicineis not typically recommended for the treatment of stress. Techniques to reduce your reaction to stressful life events include: Stress identification. Monitor yourself for symptoms of stress and identify what causes stress for you. These skills may help you to avoid or prepare for stressful events. Time management. Set your priorities, keep a calendar of events, and learn to say no. Taking these actions can help you avoid making too many commitments. Techniques for coping with stress include: Rethinking the problem. Try to think realistically about stressful events rather than ignoring them or overreacting. Try to find the positives in a stressful situation rather than focusing on the negatives. Exercise. Physical exercise can release both physical and emotional tension. The key is to find a form of exercise that you enjoy and do it regularly. Relaxation techniques. These relax the body and mind. The key is to find one or more that you enjoy and use the techniques regularly. Examples include: Meditation, deep breathing, or progressive relaxation techniques. Yoga or tai chi. Biofeedback, mindfulness  techniques, or journaling. Listening to music, being out in nature, or participating in other hobbies. Practicing a healthy lifestyle. Eat a balanced diet, drink plenty of water, limit or avoid caffeine, and get plenty of sleep. Having a strong support network. Spend time with family, friends, or other people you enjoy being around. Express your  feelings and talk things over with someone you trust. Counseling or talk therapy with a mental health professional may be helpful if you are havingtrouble managing stress on your own. Follow these instructions at home: Lifestyle  Avoid drugs. Do not use any products that contain nicotine or tobacco, such as cigarettes, e-cigarettes, and chewing tobacco. If you need help quitting, ask your health care provider. Limit alcohol intake to no more than 1 drink a day for nonpregnant women and 2 drinks a day for men. One drink equals 12 oz of beer, 5 oz of wine, or 1 oz of hard liquor Do not use alcohol or drugs to relax. Eat a balanced diet that includes fresh fruits and vegetables, whole grains, lean meats, fish, eggs, and beans, and low-fat dairy. Avoid processed foods and foods high in added fat, sugar, and salt. Exercise at least 30 minutes on 5 or more days each week. Get 7-8 hours of sleep each night.  General instructions  Practice stress management techniques as discussed with your health care provider. Drink enough fluid to keep your urine clear or pale yellow. Take over-the-counter and prescription medicines only as told by your health care provider. Keep all follow-up visits as told by your health care provider. This is important.  Contact a health care provider if: Your symptoms get worse. You have new symptoms. You feel overwhelmed by your problems and can no longer manage them on your own. Get help right away if: You have thoughts of hurting yourself or others. If you ever feel like you may hurt yourself or others, or have thoughts about taking your own life, get help right away. You can go to your nearest emergency department or call: Your local emergency services (911 in the [place] A suicide crisis helpline, such as the [facility] at (404)021-5286. This is open 24 hours a day. Summary Stress is a normal reaction to life events. It can cause problems  if it happens too often or for too long. Practicing stress management techniques is the best way to treat stress. Counseling or talk therapy with a mental health professional may be helpful if you are having trouble managing stress on your own. This information is not intended to replace advice given to you by your health care provider. Make sure you discuss any questions you have with your healthcare provider. Document Revised: 04/08/2020 Document Reviewed: 04/08/2020 Elsevier Patient Education  2022 [employer].

## 2021-03-07 ENCOUNTER — Other Ambulatory Visit: Payer: 59

## 2021-08-01 IMAGING — MG DIGITAL DIAGNOSTIC BILAT W/ TOMO W/ CAD
8 series · 8 of 24 positions shown · non-contrast
Comparison: Previous exam(s).

CLINICAL DATA: First six-month follow-up for probably benign RIGHT
breast mass and LEFT breast asymmetry.

EXAM:
DIGITAL DIAGNOSTIC BILATERAL MAMMOGRAM WITH IMPLANTS, CAD AND
TOMOSYNTHESIS
ULTRASOUND RIGHT BREAST
TECHNIQUE: Bilateral digital diagnostic mammography and breast tomosynthesis
was performed. Digital images of the breasts were evaluated with
computer-aided detection. Standard and/or implant displaced views
were performed. Targeted ultrasound examination of the Right breast
was performed.

[R CC synth-2D]
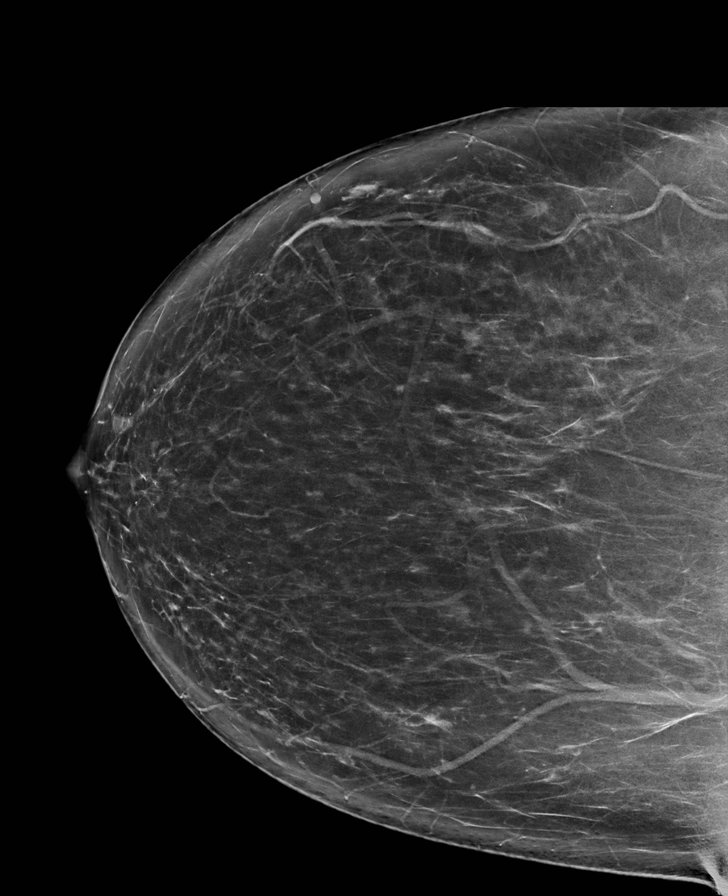

[R MLO synth-2D]
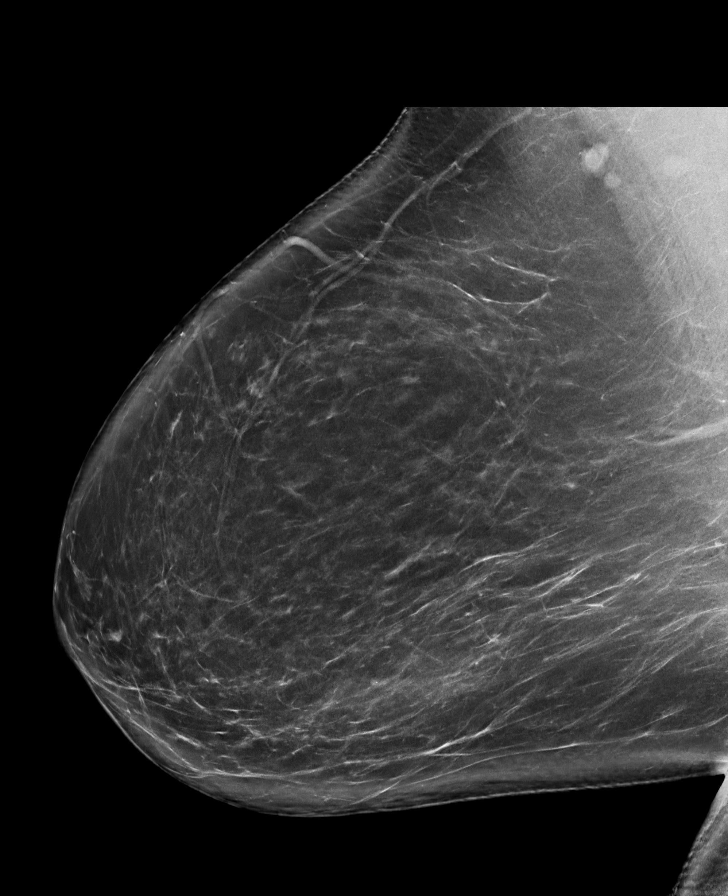

[L MLO synth-2D]
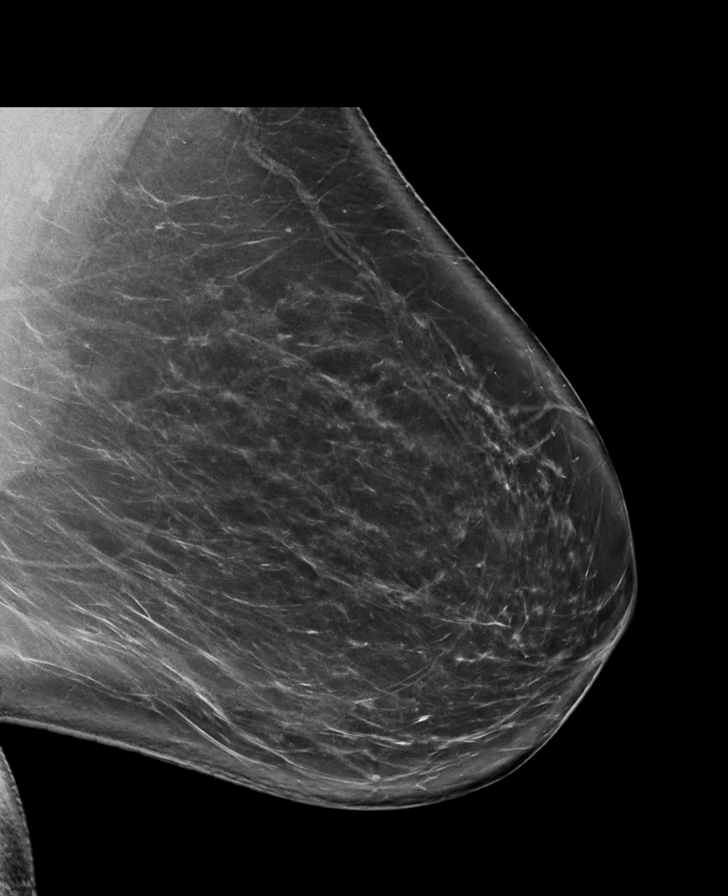

[L CC synth-2D]
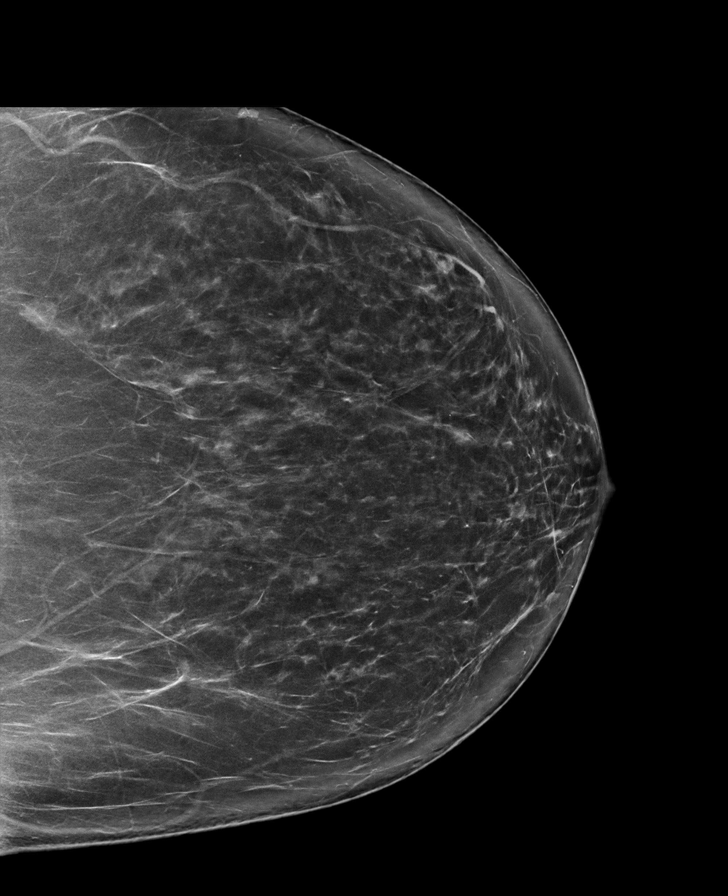

[R MLO tomo · tomo slice 51/102.0]
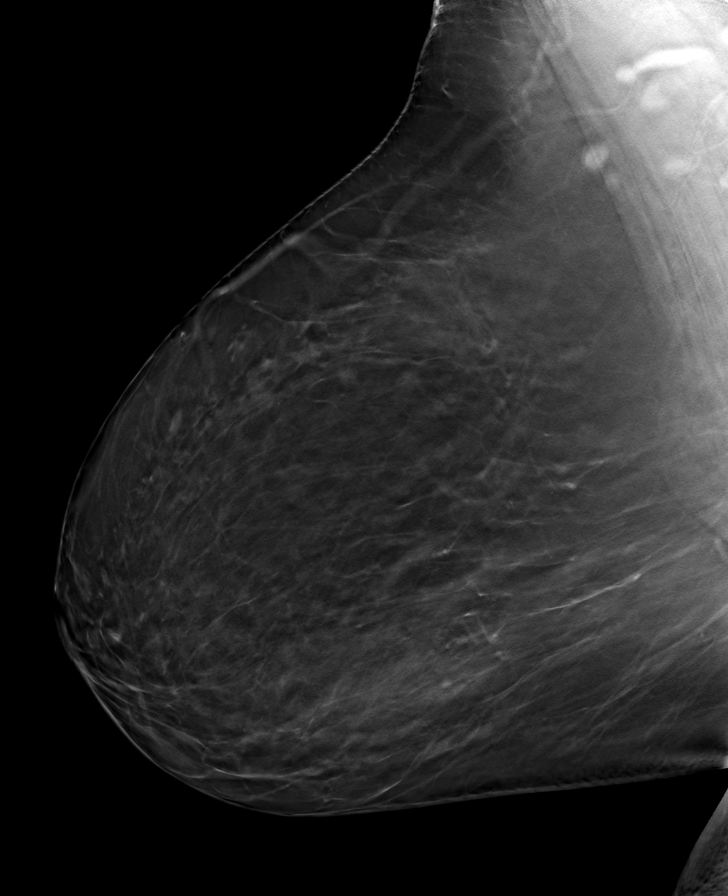

[R CC tomo · tomo slice 43/84.0]
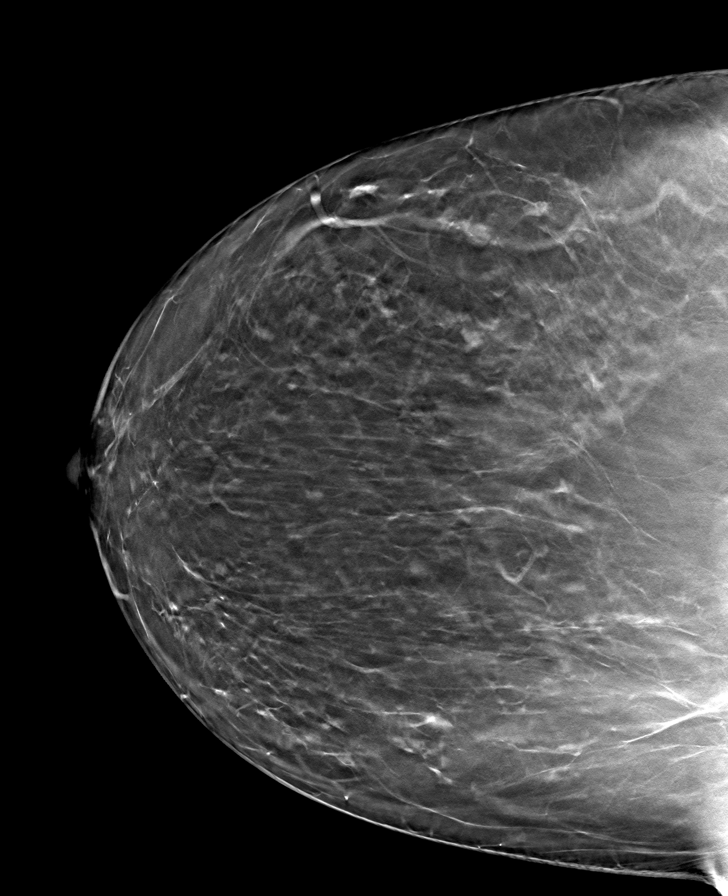

[L CC tomo · tomo slice 43/85.0]
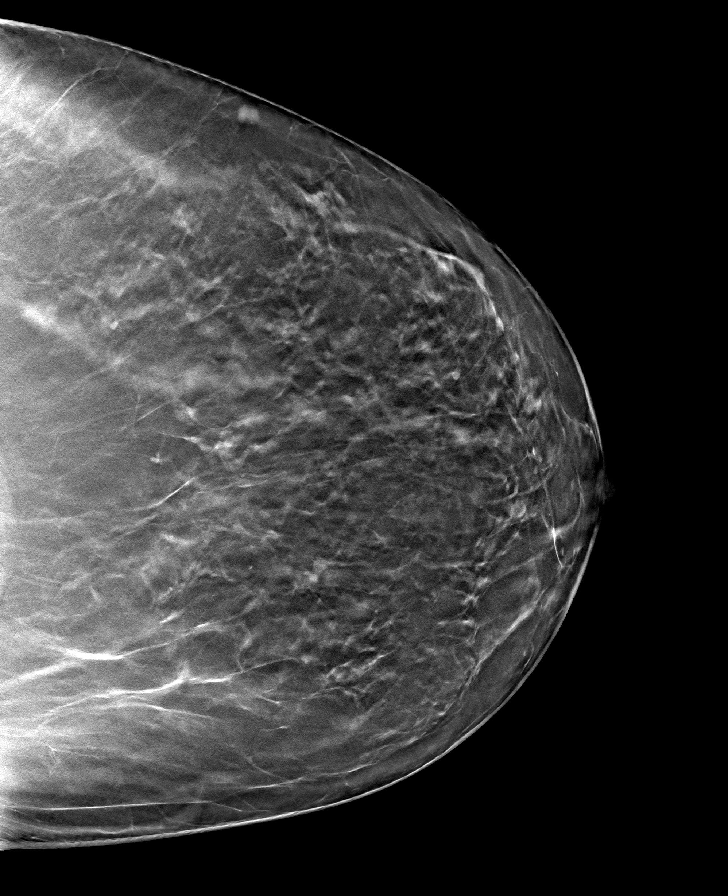

[L MLO tomo · tomo slice 51/101.0]
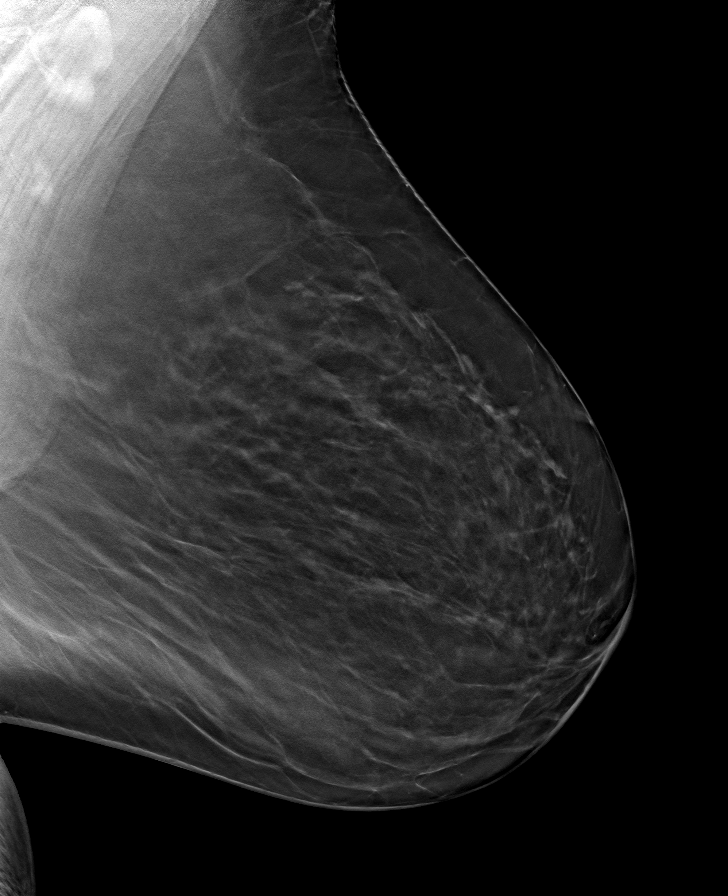

[8 of 24 positions shown; findings below may reference images not displayed]

ACR Breast Density Category b: There are scattered areas of
fibroglandular density.
FINDINGS: RIGHT BREAST:

Mammogram: Scattered circumscribed oval masses are identified
throughout the RIGHT breast, none with suspicious features.
Mammographic images were processed with CAD.

Ultrasound: Targeted ultrasound is performed, showing a simple cyst
in the 9:30 o'clock location of the RIGHT breast 7 centimeters from
the nipple which measures 0.3 x 0.2 x 0.5 centimeters. The small
mass previously noted in the 9:30 o'clock location 8 centimeters
from the nipple is no longer apparent.

LEFT BREAST:

Mammogram: Stable appearance of asymmetry in the LATERAL portion of
the LEFT breast which had no sonographic correlate at the time of
previous evaluation. Scattered circumscribed low-attenuation oval
masses are consistent with benign changes. Mammographic images were
processed with CAD.
IMPRESSION: 1.  No mammographic or ultrasound evidence for malignancy.
2. Fibrocystic changes in the RIGHT breast.
3. Stable asymmetry in the LEFT breast, favoring benign process.

RECOMMENDATION:
Recommend bilateral diagnostic mammogram and possible RIGHT breast
ultrasound in 6 months.

I have discussed the findings and recommendations with the patient.
If applicable, a reminder letter will be sent to the patient
regarding the next appointment.

BI-RADS CATEGORY  3: Probably benign.

## 2021-08-31 ENCOUNTER — Encounter: Payer: Self-pay | Admitting: Family Medicine

## 2021-08-31 ENCOUNTER — Ambulatory Visit (INDEPENDENT_AMBULATORY_CARE_PROVIDER_SITE_OTHER): Payer: 59 | Admitting: Family Medicine

## 2021-08-31 ENCOUNTER — Other Ambulatory Visit: Payer: Self-pay

## 2021-08-31 VITALS — BP 124/78 | HR 77 | Temp 98.3°F | Ht 66.0 in | Wt 208.2 lb

## 2021-08-31 DIAGNOSIS — Z0000 Encounter for general adult medical examination without abnormal findings: Secondary | ICD-10-CM | POA: Diagnosis not present

## 2021-08-31 DIAGNOSIS — Z23 Encounter for immunization: Secondary | ICD-10-CM

## 2021-08-31 DIAGNOSIS — I10 Essential (primary) hypertension: Secondary | ICD-10-CM | POA: Diagnosis not present

## 2021-08-31 DIAGNOSIS — E039 Hypothyroidism, unspecified: Secondary | ICD-10-CM

## 2021-08-31 DIAGNOSIS — E785 Hyperlipidemia, unspecified: Secondary | ICD-10-CM | POA: Diagnosis not present

## 2021-08-31 DIAGNOSIS — M542 Cervicalgia: Secondary | ICD-10-CM | POA: Diagnosis not present

## 2021-08-31 DIAGNOSIS — F410 Panic disorder [episodic paroxysmal anxiety] without agoraphobia: Secondary | ICD-10-CM | POA: Diagnosis not present

## 2021-08-31 DIAGNOSIS — K219 Gastro-esophageal reflux disease without esophagitis: Secondary | ICD-10-CM

## 2021-08-31 DIAGNOSIS — H6123 Impacted cerumen, bilateral: Secondary | ICD-10-CM

## 2021-08-31 MED ORDER — PAROXETINE HCL 10 MG PO TABS: 10.0000 mg | ORAL_TABLET | Freq: Every day | ORAL | 2 refills | Status: DC

## 2021-08-31 MED ORDER — OMEPRAZOLE 40 MG PO CPDR: 40.0000 mg | DELAYED_RELEASE_CAPSULE | Freq: Every day | ORAL | 1 refills | Status: DC

## 2021-08-31 MED ORDER — HYDROCHLOROTHIAZIDE 12.5 MG PO CAPS: 12.5000 mg | ORAL_CAPSULE | Freq: Every day | ORAL | 2 refills | Status: DC

## 2021-08-31 MED ORDER — LEVOTHYROXINE SODIUM 150 MCG PO TABS: 150.0000 ug | ORAL_TABLET | Freq: Every day | ORAL | 2 refills | Status: DC

## 2021-08-31 NOTE — Patient Instructions (Addendum)
I will check labs, but anticipate improved numbers with diet and exercise changes.   I will refer you to ENT for neck sensation, but try gentle stretching of neck throughout the day for possible SCM muscle cause.   Schedule mammogram, eye specialist, and dental visit.  See information below on earwax.   If decreased hearing or ears feel obstructed please return for possible cleaning here.  Thanks for coming in today.   Earwax Buildup, Adult The ears produce a substance called earwax that helps keep bacteria out of the ear and protects the skin in the ear canal. Occasionally, earwax can build up in the ear and cause discomfort or hearing loss. What are the causes? This condition is caused by a buildup of earwax. Ear canals are self-cleaning. Ear wax is made in the outer part of the ear canal and generally falls out in small amounts over time. When the self-cleaning mechanism is not working, earwax builds up and can cause decreased hearing and discomfort. Attempting to clean ears with cotton swabs can push the earwax deep into the ear canal and cause decreased hearing and pain. What increases the risk? This condition is more likely to develop in people who: Clean their ears often with cotton swabs. Pick at their ears. Use earplugs or in-ear headphones often, or wear hearing aids. The following factors may also make you more likely to develop this condition: Being female. Being of older age. Naturally producing more earwax. Having narrow ear canals. Having earwax that is overly thick or sticky. Having excess hair in the ear canal. Having eczema. Being dehydrated. What are the signs or symptoms? Symptoms of this condition include: Reduced or muffled hearing. A feeling of fullness in the ear or feeling that the ear is plugged. Fluid coming from the ear. Ear pain or an itchy ear. Ringing in the ear. Coughing. Balance problems. An obvious piece of earwax that can be seen inside the ear  canal. How is this diagnosed? This condition may be diagnosed based on: Your symptoms. Your medical history. An ear exam. During the exam, your health care provider will look into your ear with an instrument called an otoscope. You may have tests, including a hearing test. How is this treated? This condition may be treated by: Using ear drops to soften the earwax. Having the earwax removed by a health care provider. The health care provider may: Flush the ear with water. Use an instrument that has a loop on the end (curette). Use a suction device. Having surgery to remove the wax buildup. This may be done in severe cases. Follow these instructions at home:  Take over-the-counter and prescription medicines only as told by your health care provider. Do not put any objects, including cotton swabs, into your ear. You can clean the opening of your ear canal with a washcloth or facial tissue. Follow instructions from your health care provider about cleaning your ears. Do not overclean your ears. Drink enough fluid to keep your urine pale yellow. This will help to thin the earwax. Keep all follow-up visits as told. If earwax builds up in your ears often or if you use hearing aids, consider seeing your health care provider for routine, preventive ear cleanings. Ask your health care provider how often you should schedule your cleanings. If you have hearing aids, clean them according to instructions from the manufacturer and your health care provider. Contact a health care provider if: You have ear pain. You develop a fever. You have  pus or other fluid coming from your ear. You have hearing loss. You have ringing in your ears that does not go away. You feel like the room is spinning (vertigo). Your symptoms do not improve with treatment. Get help right away if: You have bleeding from the affected ear. You have severe ear pain. Summary Earwax can build up in the ear and cause discomfort or  hearing loss. The most common symptoms of this condition include reduced or muffled hearing, a feeling of fullness in the ear, or feeling that the ear is plugged. This condition may be diagnosed based on your symptoms, your medical history, and an ear exam. This condition may be treated by using ear drops to soften the earwax or by having the earwax removed by a health care provider. Do not put any objects, including cotton swabs, into your ear. You can clean the opening of your ear canal with a washcloth or facial tissue. This information is not intended to replace advice given to you by your health care provider. Make sure you discuss any questions you have with your health care provider. Document Revised: 11/10/2019 Document Reviewed: 11/10/2019 Elsevier Patient Education  2022 [employer]   Preventive Care 16-75 Years Old, Female Preventive care refers to lifestyle choices and visits with your health care provider that can promote health and wellness. Preventive care visits are also called wellness exams. What can I expect for my preventive care visit? Counseling Your health care provider may ask you questions about your: Medical history, including: Past medical problems. Family medical history. Pregnancy history. Current health, including: Menstrual cycle. Method of birth control. Emotional well-being. Home life and relationship well-being. Sexual activity and sexual health. Lifestyle, including: Alcohol, nicotine or tobacco, and drug use. Access to firearms. Diet, exercise, and sleep habits. Work and work Statistician. Sunscreen use. Safety issues such as seatbelt and bike helmet use. Physical exam Your health care provider will check your: Height and weight. These may be used to calculate your BMI (body mass index). BMI is a measurement that tells if you are at a healthy weight. Waist circumference. This measures the distance around your waistline. This measurement also  tells if you are at a healthy weight and may help predict your risk of certain diseases, such as type 2 diabetes and high blood pressure. Heart rate and blood pressure. Body temperature. Skin for abnormal spots. What immunizations do I need? Vaccines are usually given at various ages, according to a schedule. Your health care provider will recommend vaccines for you based on your age, medical history, and lifestyle or other factors, such as travel or where you work. What tests do I need? Screening Your health care provider may recommend screening tests for certain conditions. This may include: Lipid and cholesterol levels. Diabetes screening. This is done by checking your blood sugar (glucose) after you have not eaten for a while (fasting). Pelvic exam and Pap test. Hepatitis B test. Hepatitis C test. HIV (human immunodeficiency virus) test. STI (sexually transmitted infection) testing, if you are at risk. Lung cancer screening. Colorectal cancer screening. Mammogram. Talk with your health care provider about when you should start having regular mammograms. This may depend on whether you have a family history of breast cancer. BRCA-related cancer screening. This may be done if you have a family history of breast, ovarian, tubal, or peritoneal cancers. Bone density scan. This is done to screen for osteoporosis. Talk with your health care provider about your test results, treatment options, and if  necessary, the need for more tests. Follow these instructions at home: Eating and drinking  Eat a diet that includes fresh fruits and vegetables, whole grains, lean protein, and low-fat dairy products. Take vitamin and mineral supplements as recommended by your health care provider. Do not drink alcohol if: Your health care provider tells you not to drink. You are pregnant, may be pregnant, or are planning to become pregnant. If you drink alcohol: Limit how much you have to 0-1 drink a  day. Know how much alcohol is in your drink. In the [place] one drink equals one 12 oz bottle of beer (355 mL), one 5 oz glass of wine (148 mL), or one 1 oz glass of hard liquor (44 mL). Lifestyle Brush your teeth every morning and night with fluoride toothpaste. Floss one time each day. Exercise for at least 30 minutes 5 or more days each week. Do not use any products that contain nicotine or tobacco. These products include cigarettes, chewing tobacco, and vaping devices, such as e-cigarettes. If you need help quitting, ask your health care provider. Do not use drugs. If you are sexually active, practice safe sex. Use a condom or other form of protection to prevent STIs. If you do not wish to become pregnant, use a form of birth control. If you plan to become pregnant, see your health care provider for a prepregnancy visit. Take aspirin only as told by your health care provider. Make sure that you understand how much to take and what form to take. Work with your health care provider to find out whether it is safe and beneficial for you to take aspirin daily. Find healthy ways to manage stress, such as: Meditation, yoga, or listening to music. Journaling. Talking to a trusted person. Spending time with friends and family. Minimize exposure to UV radiation to reduce your risk of skin cancer. Safety Always wear your seat belt while driving or riding in a vehicle. Do not drive: If you have been drinking alcohol. Do not ride with someone who has been drinking. When you are tired or distracted. While texting. If you have been using any mind-altering substances or drugs. Wear a helmet and other protective equipment during sports activities. If you have firearms in your house, make sure you follow all gun safety procedures. Seek help if you have been physically or sexually abused. What's next? Visit your health care provider once a year for an annual wellness visit. Ask your health care provider  how often you should have your eyes and teeth checked. Stay up to date on all vaccines. This information is not intended to replace advice given to you by your health care provider. Make sure you discuss any questions you have with your health care provider. Document Revised: 01/18/2021 Document Reviewed: 01/18/2021 Elsevier Patient Education  2022 [employer].      If you have lab work done today you will be contacted with your lab results within the next 2 weeks.  If you have not heard from [place] then please contact us. The fastest way to get your results is to register for My Chart.   IF you received an x-ray today, you will receive an invoice from [facility]. Please contact [facility] at [phone] with questions or concerns regarding your invoice.   IF you received labwork today, you will receive an invoice from [facility]. Please contact [facility] at [phone] with questions or concerns regarding your invoice.   Our billing staff will not be able to assist  you with questions regarding bills from these companies.  You will be contacted with the lab results as soon as they are available. The fastest way to get your results is to activate your My Chart account. Instructions are located on the last page of this paperwork. If you have not heard from [place] regarding the results in 2 weeks, please contact this office.

## 2021-08-31 NOTE — Progress Notes (Signed)
° °Subjective:  °Patient ID: Christine [name], female    DOB: 01/19/1961  Age: 60 y.o. MRN: [id number] ° °CC:  °Chief Complaint  °Patient presents with  ° Annual Exam  °  Patient is her for a physical and lab work.  ° ° °HPI °Christine [name] presents for  °Annual exam: ° °Recent sinus infection, on last day of abx - doing better.  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 °Last ate this morning - 6 hrs ago.  ° °Hypertension: °HCTZ 12.5 mg daily.  °No new side effects.  °Home readings:none.  °BP Readings from Last 3 Encounters:  °08/31/21 124/78  °03/03/21 132/74  °02/16/21 134/76  ° °Lab Results  °Component Value Date  ° CREATININE 0.63 03/03/2021  ° ° °Hypothyroidism: °Lab Results  °Component Value Date  ° TSH 3.750 05/23/2020  °Synthroid 150 mcg daily °Taking medication daily.  °No new hot or cold intolerance, but brief hot flushes at night that pass No new hair or skin changes, heart palpitations or new fatigue. No new weight changes.  °Wt Readings from Last 3 Encounters:  °08/31/21 208 lb 3.2 oz (94.4 kg)  °03/06/21 211 lb (95.7 kg)  °02/16/21 214 lb 12.8 oz (97.4 kg)  ° ° °Anxiety: °Treated with Paxil, discussed anxiety attack in August, alprazolam temporarily if needed. °No recent flairs, not needing alprazolam.  ° °Depression screen PHQ 2/9 08/31/2021 03/06/2021 02/16/2021 09/13/2020 05/23/2020  °Decreased Interest 0 0 0 0 0  °Down, Depressed, Hopeless 0 0 0 0 0  °PHQ - 2 Score 0 0 0 0 0  °Altered sleeping 0 0 - - -  °Tired, decreased energy 0 0 - - -  °Change in appetite 0 0 - - -  °Feeling bad or failure about yourself  0 0 - - -  °Trouble concentrating 0 0 - - -  °Moving slowly or fidgety/restless 0 0 - - -  °Suicidal thoughts 0 0 - - -  °PHQ-9 Score 0 0 - - -  °Difficult doing work/chores Not difficult at all - - - -  ° °Hyperlipidemia: °No meds, no prior statin. Has lost weight prior. Minimal with holidays - plans to work on diet and exercise.  °Working on avoiding sugar - sweets.  °No FH of CAD known.  °The 10-year ASCVD  risk score (Arnett DK, et al., 2019) is: 5.3% °  Values used to calculate the score: °    Age: 60 years °    Sex: Female °    Is Non-Hispanic African American: No °    Diabetic: No °    Tobacco smoker: No °    Systolic Blood Pressure: 124 mmHg °    Is BP treated: Yes °    HDL Cholesterol: 57 mg/dL °    Total Cholesterol: 283 mg/dL ° °Lab Results  °Component Value Date  ° CHOL 283 (H) 05/23/2020  ° HDL 57 05/23/2020  ° LDLCALC 183 (H) 05/23/2020  ° TRIG 228 (H) 05/23/2020  ° CHOLHDL 5.0 (H) 05/23/2020  ° °Lab Results  °Component Value Date  ° ALT 35 (H) 07/19/2020  ° AST 19 07/19/2020  ° ALKPHOS 61 07/19/2020  ° BILITOT 0.5 07/19/2020  ° °GERD: °Omeprazole 40mg per day.  °No breakthrough heartburn.  °Pulling sensation in throat - for years. Overall better, no difficulty swallowing.  °Discussed with GI - referred to ent? Did not see.  ° ° °Cancer screening °Colonoscopy: 08/10/20.  °Mammogram: 09/06/20 - will schedule.  °Pap 09/13/20 ° °Immunization   History  °Administered Date(s) Administered  ° Influenza,inj,Quad PF,6+ Mos 08/31/2021  ° Janssen (J&J) SARS-COV-2 Vaccination 10/06/2019  ° PFIZER(Purple Top)SARS-COV-2 Vaccination 08/03/2020, 02/08/2021  °Had bivalent booster late last year.  °Shingles vaccine - defers to later date at pharmacy.  °Flu vaccine today.  ° °Optho - no recent visit. Plans to schedule.  ° °Dental: overdue - plans to schedule.  ° °Alcohol: °None ° °Tobacco: °None.  ° °Exercise: °Yoga recently. Some stretching. Minimal exercise to elevate HR. New start. 10-15 min at a time - 45 min ° °History °Patient Active Problem List  ° Diagnosis Date Noted  ° Essential hypertension 09/05/2015  ° Hypothyroid 07/02/2012  ° Anxiety 07/02/2012  ° BMI 31.0-31.9,adult 07/02/2012  ° °Past Medical History:  °Diagnosis Date  ° Anxiety   ° GERD (gastroesophageal reflux disease)   ° HLD (hyperlipidemia)   ° Hypertension   ° Hypothyroidism   ° °Past Surgical History:  °Procedure Laterality Date  ° CESAREAN SECTION    °  COLONOSCOPY    ° more than 10 years ago  ° WISDOM TOOTH EXTRACTION    ° °Allergies  °Allergen Reactions  ° Codeine Nausea Only  ° °Prior to Admission medications   °Medication Sig Start Date End Date Taking? Authorizing Provider  °ALPRAZolam (XANAX) 0.5 MG tablet Take 0.5-1 tablets (0.25-0.5 mg total) by mouth 2 (two) times daily as needed for anxiety (or panic attack.). 03/06/21  Yes Greene, Jeffrey R, MD  °hydrochlorothiazide (MICROZIDE) 12.5 MG capsule Take 1 capsule (12.5 mg total) by mouth daily. 02/16/21  Yes Greene, Jeffrey R, MD  °levothyroxine (SYNTHROID) 150 MCG tablet Take 1 tablet (150 mcg total) by mouth daily. 02/16/21  Yes Greene, Jeffrey R, MD  °omeprazole (PRILOSEC) 40 MG capsule TAKE 1 CAPSULE (40 MG TOTAL) BY MOUTH DAILY. 11/15/20  Yes Beavers, Kimberly, MD  °PARoxetine (PAXIL) 10 MG tablet Take 1 tablet (10 mg total) by mouth daily. 02/16/21  Yes Greene, Jeffrey R, MD  °triamcinolone cream (KENALOG) 0.1 % Apply 1 application topically 2 (two) times daily. 03/03/21  Yes Butler, Michael C, MD  ° °Social History  ° °Socioeconomic History  ° Marital status: Widowed  °  Spouse name: Not on file  ° Number of children: 2  ° Years of education: Not on file  ° Highest education level: Not on file  °Occupational History  ° Occupation: Teacher  ° Occupation: house painter  °Tobacco Use  ° Smoking status: Former  °  Types: Cigarettes  °  Quit date: 04/25/1985  °  Years since quitting: 36.3  ° Smokeless tobacco: Never  °Vaping Use  ° Vaping Use: Never used  °Substance and Sexual Activity  ° Alcohol use: No  ° Drug use: No  ° Sexual activity: Never  °Other Topics Concern  ° Not on file  °Social History Narrative  ° Not on file  ° °Social Determinants of Health  ° °Financial Resource Strain: Not on file  °Food Insecurity: Not on file  °Transportation Needs: Not on file  °Physical Activity: Not on file  °Stress: Not on file  °Social Connections: Not on file  °Intimate Partner Violence: Not on file  ° ° °Review of  Systems °13 point review of systems per patient health survey noted.  Negative other than as indicated above or in HPI.  ° ° °Objective:  ° °Vitals:  ° 08/31/21 1411  °BP: 124/78  °Pulse: 77  °Temp: 98.3 °F (36.8 °C)  °TempSrc: Temporal  °SpO2: 95%  °Weight: 208 lb   lb 3.2 oz (94.4 kg)  Height: 5' 6" (1.676 m)     Physical Exam Constitutional:      Appearance: She is well-developed.  HENT:     Head: Normocephalic and atraumatic.     Right Ear: External ear normal. There is impacted cerumen (Excess cerumen bilaterally, but still able to hear bilaterally.).     Left Ear: External ear normal. There is impacted cerumen.  Eyes:     Conjunctiva/sclera: Conjunctivae normal.     Pupils: Pupils are equal, round, and reactive to light.  Neck:     Thyroid: No thyromegaly.     Comments: No mass or asymmetry appreciated on anterior neck.  Area of discomfort appears to be at [facility] muscle, with some slight discomfort around the right SCM on resisted left neck rotation. Cardiovascular:     Rate and Rhythm: Normal rate and regular rhythm.     Heart sounds: Normal heart sounds. No murmur heard. Pulmonary:     Effort: Pulmonary effort is normal. No respiratory distress.     Breath sounds: Normal breath sounds. No wheezing.  Abdominal:     General: Bowel sounds are normal.     Palpations: Abdomen is soft.     Tenderness: There is no abdominal tenderness.  Musculoskeletal:        General: No tenderness. Normal range of motion.     Cervical back: Normal range of motion and neck supple.  Lymphadenopathy:     Cervical: No cervical adenopathy.  Skin:    General: Skin is warm and dry.     Findings: No rash.  Neurological:     Mental Status: She is alert and oriented to person, place, and time.  Psychiatric:        Behavior: Behavior normal.        Thought Content: Thought content normal.       Assessment & Plan:  Christine [name] is a 61 y.o. female . Annual physical exam  - -anticipatory guidance as  below in AVS, screening labs above. Health maintenance items as above in HPI discussed/recommended as applicable.   Flu vaccine need - Plan: Flu Vaccine QUAD 6+ mos PF IM (Fluarix Quad PF)  Sore neck - Plan: Ambulatory referral to ENT -Reports previous evaluation by GI, recommended ENT eval.  On exam possibly as discomfort over the sternocleidomastoid muscle.  Will refer to ENT but recommended stretching, range of motion as that could help if SCM as cause.  Panic attacks - Plan: PARoxetine (PAXIL) 10 MG tablet  -Controlled with paroxetine, continue same for now.  Has alprazolam if needed for breakthrough symptoms.  Essential hypertension - Plan: hydrochlorothiazide (MICROZIDE) 12.5 MG capsule, Comprehensive metabolic panel  -Stable, continue hydrochlorothiazide.  Hypothyroidism, unspecified type - Plan: levothyroxine (SYNTHROID) 150 MCG tablet, TSH  -Updated labs ordered, continue Synthroid same dose  Gastroesophageal reflux disease, unspecified whether esophagitis present  -Stable with omeprazole, continue same.  Excessive cerumen in both ear canals  -Not obstructed at this time, RTC precautions given, handout given.  Hyperlipidemia, unspecified hyperlipidemia type - Plan: Lipid panel  -Significant elevation previously, not on statin.  Does plan on improving diet, exercise since the holidays.  Check baseline labs to decide on repeat interval versus medication.  Meds ordered this encounter  Medications   omeprazole (PRILOSEC) 40 MG capsule    Sig: Take 1 capsule (40 mg total) by mouth daily.    Dispense:  90 capsule    Refill:  1   PARoxetine (PAXIL) 10  tablet  °  Sig: Take 1 tablet (10 mg total) by mouth daily.  °  Dispense:  90 tablet  °  Refill:  2  ° hydrochlorothiazide (MICROZIDE) 12.5 MG capsule  °  Sig: Take 1 capsule (12.5 mg total) by mouth daily.  °  Dispense:  90 capsule  °  Refill:  2  ° levothyroxine (SYNTHROID) 150 MCG tablet  °  Sig: Take 1 tablet (150 mcg total) by  mouth daily.  °  Dispense:  90 tablet  °  Refill:  2  ° °Patient Instructions  ° °I will check labs, but anticipate improved numbers with diet and exercise changes.  ° °I will refer you to ENT for neck sensation, but try gentle stretching of neck throughout the day for possible SCM muscle cause.  ° °Schedule mammogram, eye specialist, and dental visit.  °See information below on MiraLAX.  If decreased hearing or ears feel obstructed please return for possible cleaning here. ° °Thanks for coming in today.  ° °Earwax Buildup, Adult °The ears produce a substance called earwax that helps keep bacteria out of the ear and protects the skin in the ear canal. Occasionally, earwax can build up in the ear and cause discomfort or hearing loss. °What are the causes? °This condition is caused by a buildup of earwax. Ear canals are self-cleaning. Ear wax is made in the outer part of the ear canal and generally falls out in small amounts over time. °When the self-cleaning mechanism is not working, earwax builds up and can cause decreased hearing and discomfort. Attempting to clean ears with cotton swabs can push the earwax deep into the ear canal and cause decreased hearing and pain. °What increases the risk? °This condition is more likely to develop in people who: °Clean their ears often with cotton swabs. °Pick at their ears. °Use earplugs or in-ear headphones often, or wear hearing aids. °The following factors may also make you more likely to develop this condition: °Being female. °Being of older age. °Naturally producing more earwax. °Having narrow ear canals. °Having earwax that is overly thick or sticky. °Having excess hair in the ear canal. °Having eczema. °Being dehydrated. °What are the signs or symptoms? °Symptoms of this condition include: °Reduced or muffled hearing. °A feeling of fullness in the ear or feeling that the ear is plugged. °Fluid coming from the ear. °Ear pain or an itchy ear. °Ringing in the  ear. °Coughing. °Balance problems. °An obvious piece of earwax that can be seen inside the ear canal. °How is this diagnosed? °This condition may be diagnosed based on: °Your symptoms. °Your medical history. °An ear exam. During the exam, your health care provider will look into your ear with an instrument called an otoscope. °You may have tests, including a hearing test. °How is this treated? °This condition may be treated by: °Using ear drops to soften the earwax. °Having the earwax removed by a health care provider. The health care provider may: °Flush the ear with water. °Use an instrument that has a loop on the end (curette). °Use a suction device. °Having surgery to remove the wax buildup. This may be done in severe cases. °Follow these instructions at home: ° °Take over-the-counter and prescription medicines only as told by your health care provider. °Do not put any objects, including cotton swabs, into your ear. You can clean the opening of your ear canal with a washcloth or facial tissue. °Follow instructions from your health care provider about cleaning your ears.   ears. Do not overclean your ears. Drink enough fluid to keep your urine pale yellow. This will help to thin the earwax. Keep all follow-up visits as told. If earwax builds up in your ears often or if you use hearing aids, consider seeing your health care provider for routine, preventive ear cleanings. Ask your health care provider how often you should schedule your cleanings. If you have hearing aids, clean them according to instructions from the manufacturer and your health care provider. Contact a health care provider if: You have ear pain. You develop a fever. You have pus or other fluid coming from your ear. You have hearing loss. You have ringing in your ears that does not go away. You feel like the room is spinning (vertigo). Your symptoms do not improve with treatment. Get help right away if: You have bleeding from the affected  ear. You have severe ear pain. Summary Earwax can build up in the ear and cause discomfort or hearing loss. The most common symptoms of this condition include reduced or muffled hearing, a feeling of fullness in the ear, or feeling that the ear is plugged. This condition may be diagnosed based on your symptoms, your medical history, and an ear exam. This condition may be treated by using ear drops to soften the earwax or by having the earwax removed by a health care provider. Do not put any objects, including cotton swabs, into your ear. You can clean the opening of your ear canal with a washcloth or facial tissue. This information is not intended to replace advice given to you by your health care provider. Make sure you discuss any questions you have with your health care provider. Document Revised: 11/10/2019 Document Reviewed: 11/10/2019 Elsevier Patient Education  2022 [employer]vier Inc.   Preventive Care 53-25 Years Old, Female Preventive care refers to lifestyle choices and visits with your health care provider that can promote health and wellness. Preventive care visits are also called wellness exams. What can I expect for my preventive care visit? Counseling Your health care provider may ask you questions about your: Medical history, including: Past medical problems. Family medical history. Pregnancy history. Current health, including: Menstrual cycle. Method of birth control. Emotional well-being. Home life and relationship well-being. Sexual activity and sexual health. Lifestyle, including: Alcohol, nicotine or tobacco, and drug use. Access to firearms. Diet, exercise, and sleep habits. Work and work Statistician. Sunscreen use. Safety issues such as seatbelt and bike helmet use. Physical exam Your health care provider will check your: Height and weight. These may be used to calculate your BMI (body mass index). BMI is a measurement that tells if you are at a healthy  weight. Waist circumference. This measures the distance around your waistline. This measurement also tells if you are at a healthy weight and may help predict your risk of certain diseases, such as type 2 diabetes and high blood pressure. Heart rate and blood pressure. Body temperature. Skin for abnormal spots. What immunizations do I need? Vaccines are usually given at various ages, according to a schedule. Your health care provider will recommend vaccines for you based on your age, medical history, and lifestyle or other factors, such as travel or where you work. What tests do I need? Screening Your health care provider may recommend screening tests for certain conditions. This may include: Lipid and cholesterol levels. Diabetes screening. This is done by checking your blood sugar (glucose) after you have not eaten for a while (fasting). Pelvic exam and Pap test.  Hepatitis B test. Hepatitis C test. HIV (human immunodeficiency virus) test. STI (sexually transmitted infection) testing, if you are at risk. Lung cancer screening. Colorectal cancer screening. Mammogram. Talk with your health care provider about when you should start having regular mammograms. This may depend on whether you have a family history of breast cancer. BRCA-related cancer screening. This may be done if you have a family history of breast, ovarian, tubal, or peritoneal cancers. Bone density scan. This is done to screen for osteoporosis. Talk with your health care provider about your test results, treatment options, and if necessary, the need for more tests. Follow these instructions at home: Eating and drinking  Eat a diet that includes fresh fruits and vegetables, whole grains, lean protein, and low-fat dairy products. Take vitamin and mineral supplements as recommended by your health care provider. Do not drink alcohol if: Your health care provider tells you not to drink. You are pregnant, may be pregnant, or  are planning to become pregnant. If you drink alcohol: Limit how much you have to 0-1 drink a day. Know how much alcohol is in your drink. In the [place] one drink equals one 12 oz bottle of beer (355 mL), one 5 oz glass of wine (148 mL), or one 1 oz glass of hard liquor (44 mL). Lifestyle Brush your teeth every morning and night with fluoride toothpaste. Floss one time each day. Exercise for at least 30 minutes 5 or more days each week. Do not use any products that contain nicotine or tobacco. These products include cigarettes, chewing tobacco, and vaping devices, such as e-cigarettes. If you need help quitting, ask your health care provider. Do not use drugs. If you are sexually active, practice safe sex. Use a condom or other form of protection to prevent STIs. If you do not wish to become pregnant, use a form of birth control. If you plan to become pregnant, see your health care provider for a prepregnancy visit. Take aspirin only as told by your health care provider. Make sure that you understand how much to take and what form to take. Work with your health care provider to find out whether it is safe and beneficial for you to take aspirin daily. Find healthy ways to manage stress, such as: Meditation, yoga, or listening to music. Journaling. Talking to a trusted person. Spending time with friends and family. Minimize exposure to UV radiation to reduce your risk of skin cancer. Safety Always wear your seat belt while driving or riding in a vehicle. Do not drive: If you have been drinking alcohol. Do not ride with someone who has been drinking. When you are tired or distracted. While texting. If you have been using any mind-altering substances or drugs. Wear a helmet and other protective equipment during sports activities. If you have firearms in your house, make sure you follow all gun safety procedures. Seek help if you have been physically or sexually abused. What's next? Visit  your health care provider once a year for an annual wellness visit. Ask your health care provider how often you should have your eyes and teeth checked. Stay up to date on all vaccines. This information is not intended to replace advice given to you by your health care provider. Make sure you discuss any questions you have with your health care provider. Document Revised: 01/18/2021 Document Reviewed: 01/18/2021 Elsevier Patient Education  2022 [employer].      If you have lab work done today you will be contacted with  your lab results within the next 2 weeks.  If you have not heard from [place] then please contact us. The fastest way to get your results is to register for My Chart.   IF you received an x-ray today, you will receive an invoice from [facility]. Please contact [facility] at [phone] with questions or concerns regarding your invoice.   IF you received labwork today, you will receive an invoice from [place]. Please contact [facility] at [phone] with questions or concerns regarding your invoice.   Our billing staff will not be able to assist you with questions regarding bills from these companies.  You will be contacted with the lab results as soon as they are available. The fastest way to get your results is to activate your My Chart account. Instructions are located on the last page of this paperwork. If you have not heard from [place] regarding the results in 2 weeks, please contact this office.       Signed,   Merri Ray, MD [place] 08/31/21 5:49 PM

## 2021-09-01 LAB — COMPREHENSIVE METABOLIC PANEL
ALT: 19 U/L (ref 0–35)
AST: 16 U/L (ref 0–37)
Albumin: 4.3 g/dL (ref 3.5–5.2)
Alkaline Phosphatase: 55 U/L (ref 39–117)
BUN: 25 mg/dL — ABNORMAL HIGH (ref 6–23)
CO2: 28 mEq/L (ref 19–32)
Calcium: 9.6 mg/dL (ref 8.4–10.5)
Chloride: 104 mEq/L (ref 96–112)
Creatinine, Ser: 0.82 mg/dL (ref 0.40–1.20)
GFR: 77.8 mL/min (ref >60.00)
Glucose, Bld: 78 mg/dL (ref 70–99)
Potassium: 4.1 mEq/L (ref 3.5–5.1)
Sodium: 142 mEq/L (ref 135–145)
Total Bilirubin: 0.4 mg/dL (ref 0.2–1.2)
Total Protein: 7.2 g/dL (ref 6.0–8.3)

## 2021-09-01 LAB — LIPID PANEL
Cholesterol: 225 mg/dL — ABNORMAL HIGH (ref 0–200)
HDL: 54.3 mg/dL (ref >39.00)
LDL Cholesterol: 142 mg/dL — ABNORMAL HIGH (ref 0–99)
NonHDL: 170.93
Total CHOL/HDL Ratio: 4
Triglycerides: 146 mg/dL (ref 0.0–149.0)
VLDL: 29.2 mg/dL (ref 0.0–40.0)

## 2021-09-01 LAB — TSH: TSH: 0.98 u[IU]/mL (ref 0.35–5.50)

## 2022-02-25 ENCOUNTER — Other Ambulatory Visit: Payer: Self-pay | Admitting: Family Medicine

## 2022-03-05 ENCOUNTER — Ambulatory Visit (INDEPENDENT_AMBULATORY_CARE_PROVIDER_SITE_OTHER): Payer: 59 | Admitting: Family Medicine

## 2022-03-05 VITALS — BP 130/70 | HR 76 | Temp 98.1°F | Resp 16 | Ht 66.0 in | Wt 216.6 lb

## 2022-03-05 DIAGNOSIS — E785 Hyperlipidemia, unspecified: Secondary | ICD-10-CM | POA: Diagnosis not present

## 2022-03-05 DIAGNOSIS — I10 Essential (primary) hypertension: Secondary | ICD-10-CM

## 2022-03-05 DIAGNOSIS — E039 Hypothyroidism, unspecified: Secondary | ICD-10-CM

## 2022-03-05 DIAGNOSIS — F410 Panic disorder [episodic paroxysmal anxiety] without agoraphobia: Secondary | ICD-10-CM

## 2022-03-05 DIAGNOSIS — Z8639 Personal history of other endocrine, nutritional and metabolic disease: Secondary | ICD-10-CM

## 2022-03-05 LAB — TSH: TSH: 0.16 u[IU]/mL — ABNORMAL LOW (ref 0.35–5.50)

## 2022-03-05 LAB — HEMOGLOBIN A1C: Hgb A1c MFr Bld: 5.9 % (ref 4.6–6.5)

## 2022-03-05 MED ORDER — HYDROCHLOROTHIAZIDE 12.5 MG PO CAPS: 12.5000 mg | ORAL_CAPSULE | Freq: Every day | ORAL | 2 refills | Status: DC

## 2022-03-05 MED ORDER — FLUOXETINE HCL 10 MG PO TABS: 10.0000 mg | ORAL_TABLET | Freq: Every day | ORAL | 1 refills | Status: DC

## 2022-03-05 MED ORDER — LEVOTHYROXINE SODIUM 150 MCG PO TABS: 150.0000 ug | ORAL_TABLET | Freq: Every day | ORAL | 2 refills | Status: DC

## 2022-03-05 NOTE — Progress Notes (Signed)
Subjective:  Patient ID: Christine [name], female    DOB: 1961-06-28  Age: 61 y.o. MRN: [id number]  CC:  Chief Complaint  Patient presents with   Gastroesophageal Reflux   Hypertension   Hypothyroidism   Anxiety    HPI Christine [name] presents for follow up. No health changes since last visit   Hypertension: HCTZ 12.5 mg daily. Some regain of weight with work schedule. Still active. Sometimes processed foods. Usually eating healthier.  Some carbs.  No new med side effects.  Home readings: none.  Dental checkups with [facility] - niece training there for dental hygienist. BP normal when checked there.  BP Readings from Last 3 Encounters:  03/05/22 130/70  08/31/21 124/78  03/03/21 132/74   Wt Readings from Last 3 Encounters:  03/05/22 216 lb 9.6 oz (98.2 kg)  08/31/21 208 lb 3.2 oz (94.4 kg)  03/06/21 211 lb (95.7 kg)    Lab Results  Component Value Date   CREATININE 0.82 08/31/2021   Hypothyroidism: Lab Results  Component Value Date   TSH 0.98 08/31/2021  Taking medication daily.  Synthroid 150 mcg daily. No new hot or cold intolerance, but some hot flushes at night at times.  Menopause past year. Minimal symptoms. Improved over time.  No new hair or skin changes, heart palpitations or new fatigue. No new weight changes other than above.   Anxiety: Treated with Paxil, previous anxiety attacks.  Alprazolam if needed but has not used in some time.  Controlled substance database reviewed, no recent listings. Paxil helping to manage anxiety. Son responds well to prozac.   GERD Without history of peptic ulcer disease.  Treated with omeprazole '40mg'$ . She was evaluated by ENT for some neck fullness/symptoms in March.  Noted to have mild posterior laryngeal edema, erythema consistent with reflux.  No other suspicious mass or lesion was noted on exam or laryngoscopy. Omeprazole daily working well. No recent breakthrough sx's.    Hyperlipidemia: No current statin.  Low ASCVD risk  score. Mother with carotid artery disease, no FH of early heart disease.  Glucose normal in January but has been elevated on few previous visits. Body mass index is 34.96 kg/m. The 10-year ASCVD risk score (Arnett DK, et al., 2019) is: 5%   Values used to calculate the score:     Age: 31 years     Sex: Female     Is Non-Hispanic African American: No     Diabetic: No     Tobacco smoker: No     Systolic Blood Pressure: 093 mmHg     Is BP treated: Yes     HDL Cholesterol: 54.3 mg/dL     Total Cholesterol: 225 mg/dL  Lab Results  Component Value Date   CHOL 225 (H) 08/31/2021   HDL 54.30 08/31/2021   LDLCALC 142 (H) 08/31/2021   TRIG 146.0 08/31/2021   CHOLHDL 4 08/31/2021   Lab Results  Component Value Date   ALT 19 08/31/2021   AST 16 08/31/2021   ALKPHOS 55 08/31/2021   BILITOT 0.4 08/31/2021        History Patient Active Problem List   Diagnosis Date Noted   Essential hypertension 09/05/2015   Hypothyroid 07/02/2012   Anxiety 07/02/2012   BMI 31.0-31.9,adult 07/02/2012   Past Medical History:  Diagnosis Date   Anxiety    GERD (gastroesophageal reflux disease)    HLD (hyperlipidemia)    Hypertension    Hypothyroidism    Past Surgical History:  Procedure  Laterality Date   CESAREAN SECTION     COLONOSCOPY     more than 10 years ago   WISDOM TOOTH EXTRACTION     Allergies  Allergen Reactions   Codeine Nausea Only   Prior to Admission medications   Medication Sig Start Date End Date Taking? Authorizing Provider  ALPRAZolam Duanne Moron) 0.5 MG tablet Take 0.5-1 tablets (0.25-0.5 mg total) by mouth 2 (two) times daily as needed for anxiety (or panic attack.). 03/06/21  Yes Wendie Agreste, MD  hydrochlorothiazide (MICROZIDE) 12.5 MG capsule Take 1 capsule (12.5 mg total) by mouth daily. 08/31/21  Yes Wendie Agreste, MD  levothyroxine (SYNTHROID) 150 MCG tablet Take 1 tablet (150 mcg total) by mouth daily. 08/31/21  Yes Wendie Agreste, MD  omeprazole  (PRILOSEC) 40 MG capsule TAKE 1 CAPSULE (40 MG TOTAL) BY MOUTH DAILY. 02/26/22  Yes Wendie Agreste, MD  PARoxetine (PAXIL) 10 MG tablet Take 1 tablet (10 mg total) by mouth daily. 08/31/21  Yes Wendie Agreste, MD  triamcinolone cream (KENALOG) 0.1 % Apply 1 application topically 2 (two) times daily. Patient not taking: Reported on 03/05/2022 03/03/21   Hayden Rasmussen, MD   Social History   Socioeconomic History   Marital status: Widowed    Spouse name: Not on file   Number of children: 2   Years of education: Not on file   Highest education level: Not on file  Occupational History   Occupation: Pharmacist, hospital   Occupation: Brewing technologist  Tobacco Use   Smoking status: Former    Types: Cigarettes    Quit date: 04/25/1985    Years since quitting: 36.8   Smokeless tobacco: Never  Vaping Use   Vaping Use: Never used  Substance and Sexual Activity   Alcohol use: No   Drug use: No   Sexual activity: Never  Other Topics Concern   Not on file  Social History Narrative   Not on file   Social Determinants of Health   Financial Resource Strain: Not on file  Food Insecurity: Not on file  Transportation Needs: Not on file  Physical Activity: Not on file  Stress: Not on file  Social Connections: Not on file  Intimate Partner Violence: Not on file    Review of Systems  Constitutional:  Negative for fatigue and unexpected weight change.  Respiratory:  Negative for chest tightness and shortness of breath.   Cardiovascular:  Negative for chest pain, palpitations and leg swelling.  Gastrointestinal:  Negative for abdominal pain and blood in stool.  Neurological:  Negative for dizziness, syncope, light-headedness and headaches.     Objective:   Vitals:   03/05/22 1050  BP: 130/70  Pulse: 76  Resp: 16  Temp: 98.1 F (36.7 C)  TempSrc: Oral  SpO2: 97%  Weight: 216 lb 9.6 oz (98.2 kg)  Height: '5\' 6"'$  (1.676 m)     Physical Exam Vitals reviewed.  Constitutional:       Appearance: Normal appearance. She is well-developed.  HENT:     Head: Normocephalic and atraumatic.  Eyes:     Conjunctiva/sclera: Conjunctivae normal.     Pupils: Pupils are equal, round, and reactive to light.  Neck:     Vascular: No carotid bruit.  Cardiovascular:     Rate and Rhythm: Normal rate and regular rhythm.     Heart sounds: Normal heart sounds.  Pulmonary:     Effort: Pulmonary effort is normal.     Breath sounds: Normal breath sounds.  Abdominal:     Palpations: Abdomen is soft. There is no pulsatile mass.     Tenderness: There is no abdominal tenderness.  Musculoskeletal:     Right lower leg: No edema.     Left lower leg: No edema.  Skin:    General: Skin is warm and dry.  Neurological:     Mental Status: She is alert and oriented to person, place, and time.  Psychiatric:        Mood and Affect: Mood normal.        Behavior: Behavior normal.        Assessment & Plan:  Christine [name] is a 61 y.o. female . Essential hypertension - Plan: hydrochlorothiazide (MICROZIDE) 12.5 MG capsule, Comprehensive metabolic panel  -  Stable, tolerating current regimen. Medications refilled. Labs pending as above.   Hypothyroidism, unspecified type - Plan: levothyroxine (SYNTHROID) 150 MCG tablet, TSH  -  Stable, tolerating current regimen. Medications refilled. Labs pending as above.   Panic attacks - Plan: FLUoxetine (PROZAC) 10 MG tablet Anxiety attack - Plan: FLUoxetine (PROZAC) 10 MG tablet  -Overall stable anxiety treatment, no recent need for benzodiazepine.  Some difficulty with losing weight, Paxil may be contributing.  We will change to Prozac, especially as family member doing well on that medication.  Potential initial side effects discussed with RTC precautions, 6-week update by MyChart message, follow-up sooner if needed.  33-monthfollow-up if doing well.  Hyperlipidemia, unspecified hyperlipidemia type - Plan: Lipid panel  -Family history of carotid artery  stenosis but no early family history of cardiac disease.  Low ASCVD risk score previously.  Plan for weight loss as above, check lipids, hold on meds for now.  History of hyperglycemia - Plan: Hemoglobin A1c  - check A1c, weight loss planned as above.   Meds ordered this encounter  Medications   levothyroxine (SYNTHROID) 150 MCG tablet    Sig: Take 1 tablet (150 mcg total) by mouth daily.    Dispense:  90 tablet    Refill:  2   hydrochlorothiazide (MICROZIDE) 12.5 MG capsule    Sig: Take 1 capsule (12.5 mg total) by mouth daily.    Dispense:  90 capsule    Refill:  2   FLUoxetine (PROZAC) 10 MG tablet    Sig: Take 1 tablet (10 mg total) by mouth daily.    Dispense:  90 tablet    Refill:  1   Patient Instructions  Change paroxetine to fluoxetine (Prozac) once per day.  I think that will still work well for anxiety treatment but less likely to cause weight gain.  You may feel little different the first week or 2 taking that medication but if you have persistent side effects or that medication is not working let me know.  Either way send me an update on how you are feeling in the next 4 to 6 weeks.  No other med changes unless labs from today indicate otherwise.  667-monthollow-up for physical but please let me know if there are questions in the meantime.  Take care!    Signed,   JeMerri RayMD [facility] 03/05/22 11:26 AM

## 2022-03-05 NOTE — Patient Instructions (Addendum)
Change paroxetine to fluoxetine (Prozac) once per day.  I think that will still work well for anxiety treatment but less likely to cause weight gain.  You may feel little different the first week or 2 taking that medication but if you have persistent side effects or that medication is not working let me know.  Either way send me an update on how you are feeling in the next 4 to 6 weeks.  No other med changes unless labs from today indicate otherwise.  66-monthfollow-up for physical but please let me know if there are questions in the meantime.  Take care!

## 2022-03-06 LAB — LIPID PANEL
Cholesterol: 232 mg/dL — ABNORMAL HIGH (ref 0–200)
HDL: 59.6 mg/dL (ref >39.00)
LDL Cholesterol: 146 mg/dL — ABNORMAL HIGH (ref 0–99)
NonHDL: 172.55
Total CHOL/HDL Ratio: 4
Triglycerides: 132 mg/dL (ref 0.0–149.0)
VLDL: 26.4 mg/dL (ref 0.0–40.0)

## 2022-03-06 LAB — COMPREHENSIVE METABOLIC PANEL
ALT: 42 U/L — ABNORMAL HIGH (ref 0–35)
AST: 28 U/L (ref 0–37)
Albumin: 4.2 g/dL (ref 3.5–5.2)
Alkaline Phosphatase: 50 U/L (ref 39–117)
BUN: 16 mg/dL (ref 6–23)
CO2: 25 mEq/L (ref 19–32)
Calcium: 9.5 mg/dL (ref 8.4–10.5)
Chloride: 104 mEq/L (ref 96–112)
Creatinine, Ser: 0.72 mg/dL (ref 0.40–1.20)
GFR: 90.61 mL/min (ref >60.00)
Glucose, Bld: 95 mg/dL (ref 70–99)
Potassium: 3.8 mEq/L (ref 3.5–5.1)
Sodium: 139 mEq/L (ref 135–145)
Total Bilirubin: 0.6 mg/dL (ref 0.2–1.2)
Total Protein: 7.5 g/dL (ref 6.0–8.3)

## 2022-03-10 ENCOUNTER — Other Ambulatory Visit: Payer: Self-pay | Admitting: Family Medicine

## 2022-03-10 DIAGNOSIS — F410 Panic disorder [episodic paroxysmal anxiety] without agoraphobia: Secondary | ICD-10-CM

## 2022-03-15 ENCOUNTER — Telehealth: Payer: Self-pay | Admitting: Family Medicine

## 2022-03-15 DIAGNOSIS — E039 Hypothyroidism, unspecified: Secondary | ICD-10-CM

## 2022-03-15 MED ORDER — LEVOTHYROXINE SODIUM 137 MCG PO TABS: 137.0000 ug | ORAL_TABLET | Freq: Every day | ORAL | 1 refills | Status: DC

## 2022-03-15 NOTE — Telephone Encounter (Signed)
Caller name: Christine [name] (pt)  On DPR? :yes/no: Yes  Call back number: [phone]  Provider they see: Dr. Carlota Raspberry  Reason for call: pt calling b/c Dr Carlota Raspberry had mentioned to her that he was changing the dosage of levothyroxine, but when she went to pick up rx, it was the same. Please advise.

## 2022-03-15 NOTE — Telephone Encounter (Signed)
You noted you would change her dose at the pharmacy but no new Rx was written?

## 2022-03-15 NOTE — Telephone Encounter (Signed)
I do not see changed dose, I reordered at 113mg now and called patient to advise.

## 2022-09-03 ENCOUNTER — Other Ambulatory Visit: Payer: Self-pay | Admitting: Family Medicine

## 2022-09-06 ENCOUNTER — Encounter: Payer: 59 | Admitting: Family Medicine

## 2022-09-06 DIAGNOSIS — Z419 Encounter for procedure for purposes other than remedying health state, unspecified: Secondary | ICD-10-CM | POA: Diagnosis not present

## 2022-10-04 ENCOUNTER — Encounter: Payer: Self-pay | Admitting: Family Medicine

## 2022-10-04 ENCOUNTER — Ambulatory Visit (INDEPENDENT_AMBULATORY_CARE_PROVIDER_SITE_OTHER): Payer: Medicaid Other | Admitting: Family Medicine

## 2022-10-04 VITALS — BP 136/74 | HR 74 | Temp 98.0°F | Ht 66.75 in | Wt 218.0 lb

## 2022-10-04 DIAGNOSIS — Z1231 Encounter for screening mammogram for malignant neoplasm of breast: Secondary | ICD-10-CM

## 2022-10-04 DIAGNOSIS — I10 Essential (primary) hypertension: Secondary | ICD-10-CM | POA: Diagnosis not present

## 2022-10-04 DIAGNOSIS — Z0000 Encounter for general adult medical examination without abnormal findings: Secondary | ICD-10-CM | POA: Diagnosis not present

## 2022-10-04 DIAGNOSIS — E039 Hypothyroidism, unspecified: Secondary | ICD-10-CM

## 2022-10-04 DIAGNOSIS — R7989 Other specified abnormal findings of blood chemistry: Secondary | ICD-10-CM | POA: Diagnosis not present

## 2022-10-04 DIAGNOSIS — R7303 Prediabetes: Secondary | ICD-10-CM

## 2022-10-04 DIAGNOSIS — J0100 Acute maxillary sinusitis, unspecified: Secondary | ICD-10-CM

## 2022-10-04 DIAGNOSIS — E785 Hyperlipidemia, unspecified: Secondary | ICD-10-CM | POA: Diagnosis not present

## 2022-10-04 DIAGNOSIS — F410 Panic disorder [episodic paroxysmal anxiety] without agoraphobia: Secondary | ICD-10-CM | POA: Diagnosis not present

## 2022-10-04 MED ORDER — LEVOTHYROXINE SODIUM 137 MCG PO TABS: 137.0000 ug | ORAL_TABLET | Freq: Every day | ORAL | 1 refills | Status: DC

## 2022-10-04 MED ORDER — HYDROCHLOROTHIAZIDE 12.5 MG PO CAPS: 12.5000 mg | ORAL_CAPSULE | Freq: Every day | ORAL | 2 refills | Status: DC

## 2022-10-04 MED ORDER — FLUOXETINE HCL 10 MG PO TABS: 10.0000 mg | ORAL_TABLET | Freq: Every day | ORAL | 1 refills | Status: DC

## 2022-10-04 MED ORDER — DOXYCYCLINE HYCLATE 100 MG PO TABS: 100.0000 mg | ORAL_TABLET | Freq: Two times a day (BID) | ORAL | 0 refills | Status: DC

## 2022-10-04 NOTE — Patient Instructions (Addendum)
Doxycycline for sinus infection.  Omeprazole nightly for now. Follow up if heartburn is not improving.  Mammogram as planned and call eye care provider for appointment. Christine [name], [facility] - Dr. Jerline Pain, or [facility].   Tetanus vaccine, shingles vaccine, and COVID booster recommended at your pharmacy.  Flu vaccine is also an option, again can be given at your pharmacy if needed.  Recheck in 6 months but let me know if there are questions.  Preventive Care 51-70 Years Old, Female Preventive care refers to lifestyle choices and visits with your health care provider that can promote health and wellness. Preventive care visits are also called wellness exams. What can I expect for my preventive care visit? Counseling Your health care provider may ask you questions about your: Medical history, including: Past medical problems. Family medical history. Pregnancy history. Current health, including: Menstrual cycle. Method of birth control. Emotional well-being. Home life and relationship well-being. Sexual activity and sexual health. Lifestyle, including: Alcohol, nicotine or tobacco, and drug use. Access to firearms. Diet, exercise, and sleep habits. Work and work Statistician. Sunscreen use. Safety issues such as seatbelt and bike helmet use. Physical exam Your health care provider will check your: Height and weight. These may be used to calculate your BMI (body mass index). BMI is a measurement that tells if you are at a healthy weight. Waist circumference. This measures the distance around your waistline. This measurement also tells if you are at a healthy weight and may help predict your risk of certain diseases, such as type 2 diabetes and high blood pressure. Heart rate and blood pressure. Body temperature. Skin for abnormal spots. What immunizations do I need?  Vaccines are usually given at various ages, according to a schedule. Your health care provider will  recommend vaccines for you based on your age, medical history, and lifestyle or other factors, such as travel or where you work. What tests do I need? Screening Your health care provider may recommend screening tests for certain conditions. This may include: Lipid and cholesterol levels. Diabetes screening. This is done by checking your blood sugar (glucose) after you have not eaten for a while (fasting). Pelvic exam and Pap test. Hepatitis B test. Hepatitis C test. HIV (human immunodeficiency virus) test. STI (sexually transmitted infection) testing, if you are at risk. Lung cancer screening. Colorectal cancer screening. Mammogram. Talk with your health care provider about when you should start having regular mammograms. This may depend on whether you have a family history of breast cancer. BRCA-related cancer screening. This may be done if you have a family history of breast, ovarian, tubal, or peritoneal cancers. Bone density scan. This is done to screen for osteoporosis. Talk with your health care provider about your test results, treatment options, and if necessary, the need for more tests. Follow these instructions at home: Eating and drinking  Eat a diet that includes fresh fruits and vegetables, whole grains, lean protein, and low-fat dairy products. Take vitamin and mineral supplements as recommended by your health care provider. Do not drink alcohol if: Your health care provider tells you not to drink. You are pregnant, may be pregnant, or are planning to become pregnant. If you drink alcohol: Limit how much you have to 0-1 drink a day. Know how much alcohol is in your drink. In the [place] one drink equals one 12 oz bottle of beer (355 mL), one 5 oz glass of wine (148 mL), or one 1 oz glass of hard liquor (44  mL). Lifestyle Brush your teeth every morning and night with fluoride toothpaste. Floss one time each day. Exercise for at least 30 minutes 5 or more days each week. Do  not use any products that contain nicotine or tobacco. These products include cigarettes, chewing tobacco, and vaping devices, such as e-cigarettes. If you need help quitting, ask your health care provider. Do not use drugs. If you are sexually active, practice safe sex. Use a condom or other form of protection to prevent STIs. If you do not wish to become pregnant, use a form of birth control. If you plan to become pregnant, see your health care provider for a prepregnancy visit. Take aspirin only as told by your health care provider. Make sure that you understand how much to take and what form to take. Work with your health care provider to find out whether it is safe and beneficial for you to take aspirin daily. Find healthy ways to manage stress, such as: Meditation, yoga, or listening to music. Journaling. Talking to a trusted person. Spending time with friends and family. Minimize exposure to UV radiation to reduce your risk of skin cancer. Safety Always wear your seat belt while driving or riding in a vehicle. Do not drive: If you have been drinking alcohol. Do not ride with someone who has been drinking. When you are tired or distracted. While texting. If you have been using any mind-altering substances or drugs. Wear a helmet and other protective equipment during sports activities. If you have firearms in your house, make sure you follow all gun safety procedures. Seek help if you have been physically or sexually abused. What's next? Visit your health care provider once a year for an annual wellness visit. Ask your health care provider how often you should have your eyes and teeth checked. Stay up to date on all vaccines. This information is not intended to replace advice given to you by your health care provider. Make sure you discuss any questions you have with your health care provider. Document Revised: 01/18/2021 Document Reviewed: 01/18/2021 Elsevier Patient Education  Ong.

## 2022-10-04 NOTE — Progress Notes (Signed)
Subjective:  Patient ID: Christine [name], female    DOB: Nov 18, 1960  Age: 62 y.o. MRN: [id number]  CC:  Chief Complaint  Patient presents with   Annual Exam    Pt doing well, notes sinus infection today post cold from beginning of the month    HPI Christine [name] presents for Annual Exam And concern for sinus infection as above  Sinus congestion: Had respiratory infection, with fever cough, congestion in early January.  Felt like sinus infection at that time. Took leftover clavamox once per day for 1 week a month ago,  Helped some, but still some sinus pressure, congestion, but bad odor and some discolored green nasal d/c.  Augmentin in past BID - nausea.  No recent fever. Slight HA, pressure in R cheek. Minimal.  No tooth pain. No dyspnea.    Hypertension: Hydrochlorothiazide 12.5 mg daily.  Elevated cholesterol previously but low 10-year heart disease risk score, no statin.  Diet/exercise approach. Borderline ALT of 42 in July 2023, up from 84 previously.no abd pain, jaundice or scleral icterus.  Last ate 4 hrs ago.  Home readings: BP Readings from Last 3 Encounters:  10/04/22 136/74  03/05/22 130/70  08/31/21 124/78   Lab Results  Component Value Date   CREATININE 0.72 03/05/2022   GERD Treated with omeprazole 40 mg daily.  Prior ENT eval with mild posterior laryngeal edema and erythema consistent with reflux.  Trying to take omeprazole at other time  - forgetting half of the time. Some breakthrough heartburn at times.    Hypothyroidism: Lab Results  Component Value Date   TSH 0.16 (L) 03/05/2022  Synthroid 137 mcg daily, dosage lowered after low TSH in July of last year, has not had recheck testing. Taking medication daily.  No new hot or cold intolerance. No new hair or skin changes, heart palpitations or new fatigue. No new weight changes.   Anxiety Treated with Paxil with previous anxiety attacks.  Alprazolam if needed but not frequently used or needed.     Controlled substance database reviewed.  Last filled #10 on 08/15/2021 Doing well, no recent need for alprazolam.      10/04/2022    3:17 PM 03/05/2022   10:57 AM 08/31/2021    2:10 PM 03/06/2021   11:45 AM 02/16/2021    2:45 PM  Depression screen PHQ 2/9  Decreased Interest 0 0 0 0 0  Down, Depressed, Hopeless 0 0 0 0 0  PHQ - 2 Score 0 0 0 0 0  Altered sleeping 0  0 0   Tired, decreased energy 0  0 0   Change in appetite 0  0 0   Feeling bad or failure about yourself  0  0 0   Trouble concentrating 0  0 0   Moving slowly or fidgety/restless 0  0 0   Suicidal thoughts 0  0 0   PHQ-9 Score 0  0 0   Difficult doing work/chores   Not difficult at all      Health Maintenance  Topic Date Due   DTaP/Tdap/Td (1 - Tdap) Never done   MAMMOGRAM  09/06/2022   COVID-19 Vaccine (5 - 2023-24 season) 10/20/2022 (Originally 04/06/2022)   INFLUENZA VACCINE  11/04/2022 (Originally 03/06/2022)   Zoster Vaccines- Shingrix (1 of 2) 01/02/2023 (Originally 05/03/1980)   HIV Screening  03/06/2023 (Originally 05/03/1976)   COLONOSCOPY (Pts 45-68yr Insurance coverage will need to be confirmed)  08/11/2023   PAP SMEAR-Modifier  09/14/2023   Hepatitis C  Screening  Completed   HPV VACCINES  Aged Out  Colonoscopy in 08/2020. Repeat in 3 years due to polyps.  Mammogram due - scheduled.  Pap 2022 - negative with neg HRHPV. Repeat 5 yrs.   Immunization History  Administered Date(s) Administered   Influenza,inj,Quad PF,6+ Mos 08/31/2021   Janssen (J&J) SARS-COV-2 Vaccination 10/06/2019   PFIZER(Purple Top)SARS-COV-2 Vaccination 08/03/2020, 02/08/2021  Flu vaccine - declined.  Covid booster - at pharmacy Shingrix - declined today.  Tdap due - today.  Rsv vaccine  - declines.  No results found. Wears glasses - optho overdue.   Dental: cleaning in past 6 months.   Alcohol: none  Tobacco: none  Exercise: improving - walking more during the day. Still some room for improvement.  Body mass index is 34.4  kg/m. Wt Readings from Last 3 Encounters:  10/04/22 218 lb (98.9 kg)  03/05/22 216 lb 9.6 oz (98.2 kg)  08/31/21 208 lb 3.2 oz (94.4 kg)   Lab Results  Component Value Date   HGBA1C 5.9 03/05/2022     History Patient Active Problem List   Diagnosis Date Noted   Essential hypertension 09/05/2015   Hypothyroid 07/02/2012   Anxiety 07/02/2012   BMI 31.0-31.9,adult 07/02/2012   Past Medical History:  Diagnosis Date   Anxiety    GERD (gastroesophageal reflux disease)    HLD (hyperlipidemia)    Hypertension    Hypothyroidism    Past Surgical History:  Procedure Laterality Date   CESAREAN SECTION     COLONOSCOPY     more than 10 years ago   WISDOM TOOTH EXTRACTION     Allergies  Allergen Reactions   Codeine Nausea Only   Prior to Admission medications   Medication Sig Start Date End Date Taking? Authorizing Provider  ALPRAZolam Duanne Moron) 0.5 MG tablet Take 0.5-1 tablets (0.25-0.5 mg total) by mouth 2 (two) times daily as needed for anxiety (or panic attack.). 03/06/21  Yes Wendie Agreste, MD  FLUoxetine (PROZAC) 10 MG capsule TAKE 1 CAPSULE BY MOUTH EVERY DAY 09/03/22  Yes Wendie Agreste, MD  hydrochlorothiazide (MICROZIDE) 12.5 MG capsule Take 1 capsule (12.5 mg total) by mouth daily. 03/05/22  Yes Wendie Agreste, MD  levothyroxine (SYNTHROID) 137 MCG tablet Take 1 tablet (137 mcg total) by mouth daily. 03/15/22  Yes Wendie Agreste, MD  omeprazole (PRILOSEC) 40 MG capsule TAKE 1 CAPSULE (40 MG TOTAL) BY MOUTH DAILY. 09/03/22  Yes Wendie Agreste, MD   Social History   Socioeconomic History   Marital status: Widowed    Spouse name: Not on file   Number of children: 2   Years of education: Not on file   Highest education level: Not on file  Occupational History   Occupation: Pharmacist, hospital   Occupation: Brewing technologist  Tobacco Use   Smoking status: Former    Types: Cigarettes    Quit date: 04/25/1985    Years since quitting: 37.4   Smokeless tobacco: Never   Vaping Use   Vaping Use: Never used  Substance and Sexual Activity   Alcohol use: No   Drug use: No   Sexual activity: Never  Other Topics Concern   Not on file  Social History Narrative   Not on file   Social Determinants of Health   Financial Resource Strain: Not on file  Food Insecurity: Not on file  Transportation Needs: Not on file  Physical Activity: Not on file  Stress: Not on file  Social Connections: Not on file  Intimate Partner Violence: Not on file    Review of Systems 13 point review of systems per patient health survey noted.  Negative other than as indicated above or in HPI.    Objective:   Vitals:   10/04/22 1522  BP: 136/74  Pulse: 74  Temp: 98 F (36.7 C)  TempSrc: Temporal  SpO2: 98%  Weight: 218 lb (98.9 kg)  Height: 5' 6.75" (1.695 m)     Physical Exam Vitals reviewed.  Constitutional:      Appearance: She is well-developed.  HENT:     Head: Normocephalic and atraumatic.     Right Ear: External ear normal.     Left Ear: External ear normal.     Nose:     Comments: R max sinus ttp.  Eyes:     Conjunctiva/sclera: Conjunctivae normal.     Pupils: Pupils are equal, round, and reactive to light.  Neck:     Thyroid: No thyromegaly.  Cardiovascular:     Rate and Rhythm: Normal rate and regular rhythm.     Heart sounds: Normal heart sounds. No murmur heard. Pulmonary:     Effort: Pulmonary effort is normal. No respiratory distress.     Breath sounds: Normal breath sounds. No wheezing.  Abdominal:     General: Bowel sounds are normal.     Palpations: Abdomen is soft.     Tenderness: There is no abdominal tenderness.  Musculoskeletal:        General: No tenderness. Normal range of motion.     Cervical back: Normal range of motion and neck supple.  Lymphadenopathy:     Cervical: No cervical adenopathy.  Skin:    General: Skin is warm and dry.     Findings: No rash.  Neurological:     Mental Status: She is alert and oriented to  person, place, and time.  Psychiatric:        Behavior: Behavior normal.        Thought Content: Thought content normal.        Assessment & Plan:  LEONETTA TINA is a 62 y.o. female . Annual physical exam  - -anticipatory guidance as below in AVS, screening labs above. Health maintenance items as above in HPI discussed/recommended as applicable.   Anxiety attack - Plan: FLUoxetine (PROZAC) 10 MG tablet Panic attacks - Plan: FLUoxetine (PROZAC) 10 MG tablet  -Stable on fluoxetine.  Continue same.  Has benzodiazepine low-dose if needed.  Essential hypertension - Plan: hydrochlorothiazide (MICROZIDE) 12.5 MG capsule  -Tolerating current dose, continue same.  Hypothyroidism, unspecified type - Plan: levothyroxine (SYNTHROID) 137 MCG tablet, TSH  -Stable, check TSH, continue Synthroid with medication adjustments based on labs.  Elevated liver function tests - Plan: Comprehensive metabolic panel  Subacute maxillary sinusitis - Plan: doxycycline (VIBRA-TABS) 100 MG tablet  -Intolerant to Augmentin previously.  Partial treatment as above, mild symptoms but residual.  Start doxycycline with RTC precautions discussed.  Encounter for screening mammogram for malignant neoplasm of breast - Plan: MM Digital Screening  Prediabetes - Plan: Hemoglobin A1c  -Diet, exercise approach, check A1c.  Hyperlipidemia, unspecified hyperlipidemia type - Plan: Comprehensive metabolic panel, Lipid panel  -Check lipids, CMP.  ASCVD risk scoring but no new meds for now.  Consistent use of PPI with option of evening dosing discussed if that is easier.  Meds ordered this encounter  Medications   FLUoxetine (PROZAC) 10 MG tablet    Sig: Take 1 tablet (10 mg total) by mouth daily.  Dispense:  90 tablet    Refill:  1   hydrochlorothiazide (MICROZIDE) 12.5 MG capsule    Sig: Take 1 capsule (12.5 mg total) by mouth daily.    Dispense:  90 capsule    Refill:  2   levothyroxine (SYNTHROID) 137 MCG tablet     Sig: Take 1 tablet (137 mcg total) by mouth daily.    Dispense:  90 tablet    Refill:  1    New dose   doxycycline (VIBRA-TABS) 100 MG tablet    Sig: Take 1 tablet (100 mg total) by mouth 2 (two) times daily.    Dispense:  20 tablet    Refill:  0   Patient Instructions  Doxycycline for sinus infection.  Omeprazole nightly for now. Follow up if heartburn is not improving.  Mammogram as planned and call eye care provider for appointment. Margot Ables, [facility] - Dr. Jerline Pain, or [facility].   Tetanus vaccine, shingles vaccine, and COVID booster recommended at your pharmacy.  Flu vaccine is also an option, again can be given at your pharmacy if needed.  Recheck in 6 months but let me know if there are questions.  Preventive Care 49-31 Years Old, Female Preventive care refers to lifestyle choices and visits with your health care provider that can promote health and wellness. Preventive care visits are also called wellness exams. What can I expect for my preventive care visit? Counseling Your health care provider may ask you questions about your: Medical history, including: Past medical problems. Family medical history. Pregnancy history. Current health, including: Menstrual cycle. Method of birth control. Emotional well-being. Home life and relationship well-being. Sexual activity and sexual health. Lifestyle, including: Alcohol, nicotine or tobacco, and drug use. Access to firearms. Diet, exercise, and sleep habits. Work and work Statistician. Sunscreen use. Safety issues such as seatbelt and bike helmet use. Physical exam Your health care provider will check your: Height and weight. These may be used to calculate your BMI (body mass index). BMI is a measurement that tells if you are at a healthy weight. Waist circumference. This measures the distance around your waistline. This measurement also tells if you are at a healthy weight and may help predict your  risk of certain diseases, such as type 2 diabetes and high blood pressure. Heart rate and blood pressure. Body temperature. Skin for abnormal spots. What immunizations do I need?  Vaccines are usually given at various ages, according to a schedule. Your health care provider will recommend vaccines for you based on your age, medical history, and lifestyle or other factors, such as travel or where you work. What tests do I need? Screening Your health care provider may recommend screening tests for certain conditions. This may include: Lipid and cholesterol levels. Diabetes screening. This is done by checking your blood sugar (glucose) after you have not eaten for a while (fasting). Pelvic exam and Pap test. Hepatitis B test. Hepatitis C test. HIV (human immunodeficiency virus) test. STI (sexually transmitted infection) testing, if you are at risk. Lung cancer screening. Colorectal cancer screening. Mammogram. Talk with your health care provider about when you should start having regular mammograms. This may depend on whether you have a family history of breast cancer. BRCA-related cancer screening. This may be done if you have a family history of breast, ovarian, tubal, or peritoneal cancers. Bone density scan. This is done to screen for osteoporosis. Talk with your health care provider about your test results, treatment options, and if  necessary, the need for more tests. Follow these instructions at home: Eating and drinking  Eat a diet that includes fresh fruits and vegetables, whole grains, lean protein, and low-fat dairy products. Take vitamin and mineral supplements as recommended by your health care provider. Do not drink alcohol if: Your health care provider tells you not to drink. You are pregnant, may be pregnant, or are planning to become pregnant. If you drink alcohol: Limit how much you have to 0-1 drink a day. Know how much alcohol is in your drink. In the [place] one drink  equals one 12 oz bottle of beer (355 mL), one 5 oz glass of wine (148 mL), or one 1 oz glass of hard liquor (44 mL). Lifestyle Brush your teeth every morning and night with fluoride toothpaste. Floss one time each day. Exercise for at least 30 minutes 5 or more days each week. Do not use any products that contain nicotine or tobacco. These products include cigarettes, chewing tobacco, and vaping devices, such as e-cigarettes. If you need help quitting, ask your health care provider. Do not use drugs. If you are sexually active, practice safe sex. Use a condom or other form of protection to prevent STIs. If you do not wish to become pregnant, use a form of birth control. If you plan to become pregnant, see your health care provider for a prepregnancy visit. Take aspirin only as told by your health care provider. Make sure that you understand how much to take and what form to take. Work with your health care provider to find out whether it is safe and beneficial for you to take aspirin daily. Find healthy ways to manage stress, such as: Meditation, yoga, or listening to music. Journaling. Talking to a trusted person. Spending time with friends and family. Minimize exposure to UV radiation to reduce your risk of skin cancer. Safety Always wear your seat belt while driving or riding in a vehicle. Do not drive: If you have been drinking alcohol. Do not ride with someone who has been drinking. When you are tired or distracted. While texting. If you have been using any mind-altering substances or drugs. Wear a helmet and other protective equipment during sports activities. If you have firearms in your house, make sure you follow all gun safety procedures. Seek help if you have been physically or sexually abused. What's next? Visit your health care provider once a year for an annual wellness visit. Ask your health care provider how often you should have your eyes and teeth checked. Stay up to  date on all vaccines. This information is not intended to replace advice given to you by your health care provider. Make sure you discuss any questions you have with your health care provider. Document Revised: 01/18/2021 Document Reviewed: 01/18/2021 Elsevier Patient Education  [place],   Merri Ray, MD [place], [facility] 10/04/22 3:40 PM

## 2022-10-05 DIAGNOSIS — Z419 Encounter for procedure for purposes other than remedying health state, unspecified: Secondary | ICD-10-CM | POA: Diagnosis not present

## 2022-10-05 LAB — COMPREHENSIVE METABOLIC PANEL
ALT: 45 U/L — ABNORMAL HIGH (ref 0–35)
AST: 29 U/L (ref 0–37)
Albumin: 4.3 g/dL (ref 3.5–5.2)
Alkaline Phosphatase: 54 U/L (ref 39–117)
BUN: 17 mg/dL (ref 6–23)
CO2: 29 mEq/L (ref 19–32)
Calcium: 9.9 mg/dL (ref 8.4–10.5)
Chloride: 102 mEq/L (ref 96–112)
Creatinine, Ser: 0.86 mg/dL (ref 0.40–1.20)
GFR: 72.91 mL/min (ref >60.00)
Glucose, Bld: 79 mg/dL (ref 70–99)
Potassium: 4.4 mEq/L (ref 3.5–5.1)
Sodium: 139 mEq/L (ref 135–145)
Total Bilirubin: 0.6 mg/dL (ref 0.2–1.2)
Total Protein: 7.5 g/dL (ref 6.0–8.3)

## 2022-10-05 LAB — LIPID PANEL
Cholesterol: 243 mg/dL — ABNORMAL HIGH (ref 0–200)
HDL: 58.2 mg/dL (ref >39.00)
LDL Cholesterol: 151 mg/dL — ABNORMAL HIGH (ref 0–99)
NonHDL: 184.5
Total CHOL/HDL Ratio: 4
Triglycerides: 166 mg/dL — ABNORMAL HIGH (ref 0.0–149.0)
VLDL: 33.2 mg/dL (ref 0.0–40.0)

## 2022-10-05 LAB — TSH: TSH: 1.52 u[IU]/mL (ref 0.35–5.50)

## 2022-10-05 LAB — HEMOGLOBIN A1C: Hgb A1c MFr Bld: 5.8 % (ref 4.6–6.5)

## 2022-10-07 ENCOUNTER — Encounter: Payer: Self-pay | Admitting: Family Medicine

## 2022-10-13 ENCOUNTER — Emergency Department (HOSPITAL_COMMUNITY): Payer: Worker's Compensation

## 2022-10-13 ENCOUNTER — Emergency Department (HOSPITAL_COMMUNITY)
Admission: EM | Admit: 2022-10-13 | Discharge: 2022-10-13 | Disposition: A | Discharge status: Home / Self Care | Payer: Worker's Compensation | Attending: Emergency Medicine | Admitting: Emergency Medicine

## 2022-10-13 ENCOUNTER — Other Ambulatory Visit: Payer: Self-pay

## 2022-10-13 DIAGNOSIS — M7989 Other specified soft tissue disorders: Secondary | ICD-10-CM | POA: Diagnosis not present

## 2022-10-13 DIAGNOSIS — M79604 Pain in right leg: Secondary | ICD-10-CM | POA: Diagnosis not present

## 2022-10-13 DIAGNOSIS — S82852A Displaced trimalleolar fracture of left lower leg, initial encounter for closed fracture: Secondary | ICD-10-CM | POA: Diagnosis not present

## 2022-10-13 DIAGNOSIS — S82851A Displaced trimalleolar fracture of right lower leg, initial encounter for closed fracture: Secondary | ICD-10-CM | POA: Diagnosis not present

## 2022-10-13 DIAGNOSIS — W010XXA Fall on same level from slipping, tripping and stumbling without subsequent striking against object, initial encounter: Secondary | ICD-10-CM | POA: Diagnosis not present

## 2022-10-13 DIAGNOSIS — Z743 Need for continuous supervision: Secondary | ICD-10-CM | POA: Diagnosis not present

## 2022-10-13 DIAGNOSIS — M25571 Pain in right ankle and joints of right foot: Secondary | ICD-10-CM | POA: Diagnosis present

## 2022-10-13 DIAGNOSIS — S93402A Sprain of unspecified ligament of left ankle, initial encounter: Secondary | ICD-10-CM | POA: Insufficient documentation

## 2022-10-13 DIAGNOSIS — R110 Nausea: Secondary | ICD-10-CM | POA: Diagnosis not present

## 2022-10-13 DIAGNOSIS — I10 Essential (primary) hypertension: Secondary | ICD-10-CM | POA: Diagnosis not present

## 2022-10-13 MED ORDER — OXYCODONE-ACETAMINOPHEN 5-325 MG PO TABS: 1.0000 | ORAL_TABLET | Freq: Four times a day (QID) | ORAL | 0 refills | Status: DC | PRN

## 2022-10-13 MED ORDER — OXYCODONE-ACETAMINOPHEN 5-325 MG PO TABS
2.0000 | ORAL_TABLET | Freq: Once | ORAL | Status: AC
  Administered 2022-10-13: 2 via ORAL
  Filled 2022-10-13: qty 2

## 2022-10-13 MED ORDER — KETOROLAC TROMETHAMINE 15 MG/ML IJ SOLN
15.0000 mg | Freq: Once | INTRAMUSCULAR | Status: AC
  Administered 2022-10-13: 15 mg via INTRAVENOUS
  Filled 2022-10-13: qty 1

## 2022-10-13 NOTE — Progress Notes (Signed)
Orthopedic Tech Progress Note Patient Details:  Christine [name] 1960/12/05 [id number]  Ortho Devices Type of Ortho Device: CAM walker, Crutches Ortho Device/Splint Location: LLE Ortho Device/Splint Interventions: Ordered, Application, Adjustment   Post Interventions Patient Tolerated: Fair Instructions Provided: Adjustment of device, Care of device, Poper ambulation with device  [name] 10/13/2022, 8:35 AM

## 2022-10-13 NOTE — ED Provider Notes (Signed)
[facility] Provider Note   CSN: [id number] Arrival date & time: 10/13/22  0256     History  Chief Complaint  Patient presents with   Christine [name] is a 62 y.o. female.  62 year old female presents to the emergency department for complaint of bilateral ankle pain.  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  She reports falling to the ground and has been experiencing pain to her bilateral ankles.  Received 100 mcg fentanyl and 4 mg Zofran, approximately 1 hour ago, en route with EMS.  No associated extremity numbness or pallor.  The history is provided by the patient. No language interpreter was used.  Fall       Home Medications Prior to Admission medications   Medication Sig Start Date End Date Taking? Authorizing Provider  oxyCODONE-acetaminophen (PERCOCET/ROXICET) 5-325 MG tablet Take 1-2 tablets by mouth every 6 (six) hours as needed for severe pain. 10/13/22  Yes Antonietta Breach, PA-C  ALPRAZolam Duanne Moron) 0.5 MG tablet Take 0.5-1 tablets (0.25-0.5 mg total) by mouth 2 (two) times daily as needed for anxiety (or panic attack.). 03/06/21   Wendie Agreste, MD  doxycycline (VIBRA-TABS) 100 MG tablet Take 1 tablet (100 mg total) by mouth 2 (two) times daily. 10/04/22   Wendie Agreste, MD  FLUoxetine (PROZAC) 10 MG tablet Take 1 tablet (10 mg total) by mouth daily. 10/04/22   Wendie Agreste, MD  hydrochlorothiazide (MICROZIDE) 12.5 MG capsule Take 1 capsule (12.5 mg total) by mouth daily. 10/04/22   Wendie Agreste, MD  levothyroxine (SYNTHROID) 137 MCG tablet Take 1 tablet (137 mcg total) by mouth daily. 10/04/22   Wendie Agreste, MD  omeprazole (PRILOSEC) 40 MG capsule TAKE 1 CAPSULE (40 MG TOTAL) BY MOUTH DAILY. 09/03/22   Wendie Agreste, MD      Allergies    Codeine    Review of Systems   Review of Systems Ten systems reviewed and  are negative for acute change, except as noted in the HPI.    Physical Exam Updated Vital Signs BP (!) 158/90   Pulse 89   Temp 97.8 F (36.6 C) (Oral)   Resp (!) 23   Ht 5' 6.75" (1.695 m)   Wt 98.9 kg   SpO2 98%   BMI 34.41 kg/m   Physical Exam Vitals and nursing note reviewed.  Constitutional:      General: She is not in acute distress.    Appearance: She is well-developed. She is not diaphoretic.     Comments: Nontoxic appearing and in NAD  HENT:     Head: Normocephalic and atraumatic.  Eyes:     General: No scleral icterus.    Conjunctiva/sclera: Conjunctivae normal.  Cardiovascular:     Rate and Rhythm: Normal rate and regular rhythm.     Pulses: Normal pulses.     Comments: DP pulse 2+ b/l Pulmonary:     Effort: Pulmonary effort is normal. No respiratory distress.  Musculoskeletal:     Cervical back: Normal range of motion.     Comments: BLE warm and well perfused. There is diffuse swelling of the R ankle without effusion. Skin abrasion to the medial R ankle. Hematoma noted to the L ankle along the course of the ATFL. No crepitus b/l. Negative thompson test.  Skin:    General: Skin is warm and  dry.     Coloration: Skin is not pale.     Findings: No erythema or rash.  Neurological:     Mental Status: She is alert and oriented to person, place, and time.  Psychiatric:        Behavior: Behavior normal.          ED Results / Procedures / Treatments   Labs (all labs ordered are listed, but only abnormal results are displayed) Labs Reviewed - No data to display  EKG None  Radiology DG Ankle Complete Right  Result Date: 10/13/2022 CLINICAL DATA:  Fall with right ankle trauma. EXAM: RIGHT ANKLE - COMPLETE 3+ VIEW COMPARISON:  None Available. FINDINGS: There is moderate circumferential soft tissue swelling and a trimalleolar ankle fracture. There is oblique lateral malleolar fracture with distal fragment displaced laterally and posteriorly by 1 cortex  width. There is a transverse medial malleolar fracture with up to 7 mm of distraction and with minimal medial translation relative to the parent bone. There is a longitudinal intra-articular fracture of the posterior malleolus, the fragment distracted up to 4 mm and cephalad displaced up to 3 mm. There is widening of the anterior mortise, mild widening along the medial mortise and narrowing of the lateral mortise. The talar dome contour is preserved. There is moderate dorsal and small plantar calcaneal spurring. IMPRESSION: 1. Trimalleolar ankle fracture with widening of the anterior mortise and narrowing of the lateral mortise. 2. Moderate dorsal and small plantar calcaneal spurring. Electronically Signed   By: Telford Nab M.D.   On: 10/13/2022 04:57   DG Ankle Complete Left  Result Date: 10/13/2022 CLINICAL DATA:  62 year old female status post fall. EXAM: LEFT ANKLE COMPLETE - 3+ VIEW COMPARISON:  None. FINDINGS: Four views. Bone mineralization is within normal limits. Maintain mortise joint alignment. Talar dome intact. Distal tibia and fibula appear intact with no significant degenerative changes. There is degenerative spurring at the calcaneus, and bulky degenerative spurring plus an 11 mm chronic appearing ossific fragment at the anterior talus, talonavicular joint. No evidence of joint effusion on the lateral. There is a small acute appearing Avulsion bone fragment at the level of the lateral calcaneus only apparent on image #1. Regional soft tissue swelling. Elsewhere the calcaneus appears intact. No other acute osseous abnormality identified. IMPRESSION: 1. Small acute appearing bone avulsion fragment at the lateral calcaneus with regional soft tissue swelling. Consider extensor digitorum brevis avulsion. 2. No other acute fracture or dislocation identified about the left ankle. 3. Bulky degenerative spurring, ossific fragment at the anterior talus, talonavicular articulation. Electronically Signed    By: Genevie Ann M.D.   On: 10/13/2022 04:55    Procedures Procedures    SPLINT APPLICATION Date/Time: [id number] AM Authorized by: Antonietta Breach Consent: Verbal consent obtained. Risks and benefits: risks, benefits and alternatives were discussed Consent given by: patient Splint applied by: orthopedic technician Location details: R ankle Splint type: posterior stirrup splint Supplies used: fiberglass, roll gauze, ACE wrap Post-procedure: The splinted body part was neurovascularly unchanged following the procedure. Patient tolerance: Patient tolerated the procedure well with no immediate complications.   Medications Ordered in ED Medications  ketorolac (TORADOL) 15 MG/ML injection 15 mg (15 mg Intravenous Given 10/13/22 0326)  oxyCODONE-acetaminophen (PERCOCET/ROXICET) 5-325 MG per tablet 2 tablet (2 tablets Oral Given 10/13/22 0547)    ED Course/ Medical Decision Making/ A&P Clinical Course as of 10/13/22 0700  Sat Oct 13, 2022  0427 Pending formal radiology interpretation, though have viewed and interpreted x-rays which  show findings consistent with bimalleolar right ankle fracture.  No acute fracture seen to left ankle. [KH]  0504 X-ray shows trimalleolar fracture of the right ankle.  X-ray of the left ankle notable for small avulsion fracture a likely representative of the extensor tendon injury. [KH]  [phone] Spoke with Dr. Erlinda Hong of Orthopedics who will review films. OK with posterior stirrup splint on the right and CAM boot on left. Will follow up with patient in the office and call back should there be any need for modifications to existing plan. [KH]    Clinical Course User Index [KH] Antonietta Breach, PA-C                             Medical Decision Making Amount and/or Complexity of Data Reviewed Radiology: ordered.  Risk Prescription drug management.   This patient presents to the ED for concern of b/l ankle pain s/p fall, this involves an extensive number of treatment options, and is  a complaint that carries with it a high risk of complications and morbidity.  The differential diagnosis includes fracture vs sprain/strain vs dislocation vs contusion   Co morbidities that complicate the patient evaluation  Obesity    Additional history obtained:  Additional history obtained from EMS External records from outside source obtained and reviewed including family medicine visit on 10/04/22   Imaging Studies ordered:  I ordered imaging studies including Xray L and R ankle  I independently visualized and interpreted imaging which showed R trimalleolar fx and L avulsion fx suspicious for tendon injury I agree with the radiologist interpretation   Cardiac Monitoring:  The patient was maintained on a cardiac monitor.  I personally viewed and interpreted the cardiac monitored which showed an underlying rhythm of: NSR   Medicines ordered and prescription drug management:  I ordered medication including Toradol and Percocet for pain  Reevaluation of the patient after these medicines showed that the patient improved I have reviewed the patients home medicines and have made adjustments as needed   Test Considered:  CT R ankle   Consultations Obtained:  I requested consultation with Dr. Erlinda Hong of Orthopedics and discussed lab and imaging findings as well as pertinent plan - they recommend: close follow up in office after splinting in the ED. Continue with NWB.   Problem List / ED Course:  As above   Reevaluation:  After the interventions noted above, I reevaluated the patient and found that they have :stayed the same   Social Determinants of Health:  Necessitates assist device for ambulation   Dispostion:  62 year old female presenting post mechanical fall with unfortunate sprain of her left ankle and right trimalleolar fracture.  She is neurovascularly intact.  Compartments of bilateral lower extremities are soft, compressible.  There is no tenting of skin or  evidence of open fracture.  Case has been discussed with orthopedics who plan to see patient in the office for follow-up.    Patient care signed out to [place], PA-C at change of shift.  She is pending placement of CAM boot and use of crutches with ambulation trial in the department.  If unable to mobilize safely, may necessitate PT/OT consultation for further assessment.         Final Clinical Impression(s) / ED Diagnoses Final diagnoses:  Trimalleolar fracture, right, closed, initial encounter  Sprain of left ankle, unspecified ligament, initial encounter    Rx / DC Orders ED Discharge Orders  Ordered    oxyCODONE-acetaminophen (PERCOCET/ROXICET) 5-325 MG tablet  Every 6 hours PRN        10/13/22 0656    Ambulatory referral to Orthopedic Surgery        10/13/22 0657    For home use only DME standard manual wheelchair with seat cushion       Comments: Patient suffers from bilateral ankle injuries which impairs their ability to perform daily activities like toileting in the home.  A crutch will not resolve issue with performing activities of daily living. A wheelchair will allow patient to safely perform daily activities. Patient can safely propel the wheelchair in the home or has a caregiver who can provide assistance. Length of need 6 months . Accessories: elevating leg rests (ELRs), wheel locks, extensions and anti-tippers.   10/13/22 0659              Antonietta Breach, PA-C 10/13/22 [id number]    Merrily Pew, MD 10/13/22 [phone]

## 2022-10-13 NOTE — Discharge Instructions (Signed)
Do not put any weight on your right foot/leg.  Follow-up with orthopedics as soon as you are able.  You have been prescribed Percocet to take as needed for severe pain.  Do not drive or drink alcohol after taking this medication as it may make you drowsy and impair your judgment.  We recommend follow-up with a primary care doctor to ensure resolution of symptoms.  Return to the ED for any new or concerning symptoms.

## 2022-10-13 NOTE — ED Triage Notes (Addendum)
Chief Complaint  Patient presents with   Fall   Pt presents to ED 18 via EMS with above complaint.  Pt states she tripped over a hose while cleaning her work car.  Possible bilateral ankle injuries; pt endorses pain to same.  EMS reports deformities to bilateral ankles; right leg splinted by EMS.  Pt received 100 mcg Fentanyl and 4 mg Zofran en route around 0235.

## 2022-10-13 NOTE — ED Provider Notes (Signed)
Care of patient handed off to me by Antonietta Breach, PA-C at change of shift.  Briefly this is a 62 year old female who presents to the emergency department with bilateral ankle pain.  She was cleaning her vehicle when she tripped over the hose over a vacuum.   Physical Exam  BP (!) 148/73   Pulse 71   Temp 97.8 F (36.6 C) (Oral)   Resp (!) 9   Ht 5' 6.75" (1.695 m)   Wt 98.9 kg   SpO2 98%   BMI 34.41 kg/m   Physical Exam Vitals and nursing note reviewed.  HENT:     Head: Normocephalic and atraumatic.  Eyes:     General: No scleral icterus. Pulmonary:     Effort: Pulmonary effort is normal. No respiratory distress.  Skin:    Findings: No rash.  Neurological:     General: No focal deficit present.     Mental Status: She is alert.  Psychiatric:        Mood and Affect: Mood normal.        Behavior: Behavior normal.        Thought Content: Thought content normal.        Judgment: Judgment normal.     Procedures  Procedures  ED Course / MDM   Clinical Course as of 10/13/22 0808  Sat Oct 13, 2022  0427 Pending formal radiology interpretation, though have viewed and interpreted x-rays which show findings consistent with bimalleolar right ankle fracture.  No acute fracture seen to left ankle. [KH]  0504 X-ray shows trimalleolar fracture of the right ankle.  X-ray of the left ankle notable for small avulsion fracture a likely representative of the extensor tendon injury. [KH]  [phone] Spoke with Dr. Erlinda Hong of Orthopedics who will review films. OK with posterior stirrup splint on the right and CAM boot on left. Will follow up with patient in the office and call back should there be any need for modifications to existing plan. [KH]    Clinical Course User Index [KH] Antonietta Breach, PA-C   Medical Decision Making Amount and/or Complexity of Data Reviewed Radiology: ordered.  Risk Prescription drug management.   Patient received plain film imaging which shows that she has a small  avulsion fracture at the left ankle and a trimalleolar fracture of the right ankle.  The right ankle was in the CT.  Dr. Erlinda Hong with orthopedics was consulted who recommended posterior stirrup splint on the right and cam boot on the left, nonweightbearing for the right leg and following up in clinic.  Patient was placed in posterior stirrup which appears placed appropriately. Also fitted for CAM boot and crutches. She was able to take a few steps with her splint/boot/crutches. Sending rx for DME wheelchair for home. She has her sons and family at bedside who will be rotating with her at home to help get around and meet basic needs. She will follow-up with Dr. Erlinda Hong in clinic. Return precautions given.       Mickie Hillier, PA-C 10/13/22 [id number]    Valarie Merino, MD 10/13/22 [phone]

## 2022-10-16 ENCOUNTER — Encounter: Payer: Self-pay | Admitting: Orthopaedic Surgery

## 2022-10-16 ENCOUNTER — Other Ambulatory Visit: Payer: Self-pay | Admitting: Physician Assistant

## 2022-10-16 ENCOUNTER — Encounter (HOSPITAL_BASED_OUTPATIENT_CLINIC_OR_DEPARTMENT_OTHER): Payer: Self-pay | Admitting: Orthopaedic Surgery

## 2022-10-16 ENCOUNTER — Ambulatory Visit (INDEPENDENT_AMBULATORY_CARE_PROVIDER_SITE_OTHER): Payer: Worker's Compensation | Admitting: Orthopaedic Surgery

## 2022-10-16 ENCOUNTER — Telehealth: Payer: Self-pay | Admitting: Orthopaedic Surgery

## 2022-10-16 ENCOUNTER — Other Ambulatory Visit: Payer: Self-pay

## 2022-10-16 DIAGNOSIS — S82851A Displaced trimalleolar fracture of right lower leg, initial encounter for closed fracture: Secondary | ICD-10-CM

## 2022-10-16 MED ORDER — OXYCODONE-ACETAMINOPHEN 5-325 MG PO TABS: 1.0000 | ORAL_TABLET | Freq: Four times a day (QID) | ORAL | 0 refills | Status: DC | PRN

## 2022-10-16 NOTE — Progress Notes (Signed)
Office Visit Note   Patient: Christine [name]           Date of Birth: Jul 07, 1961           MRN: [id number] Visit Date: 10/16/2022              Requested by: Antonietta Breach, PA-C [address] [place],  [place] [id number] PCP: Wendie Agreste, MD   Assessment & Plan: Visit Diagnoses:  1. Closed displaced trimalleolar fracture of right ankle, initial encounter     Plan: Impression is right trimalleolar ankle fracture.  X-rays CT scans were reviewed with the patient and her brother-in-law.  Recommendations for ORIF this week.  In the meantime she will keep it elevated at all times.  Questions encouraged and answered.  Total face to face encounter time was greater than 45 minutes and over half of this time was spent in counseling and/or coordination of care.   Follow-Up Instructions: No follow-ups on file.   Orders:  No orders of the defined types were placed in this encounter.  No orders of the defined types were placed in this encounter.     Procedures: No procedures performed   Clinical Data: No additional findings.   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  She was at work and was vacuuming her work car on 10/13/2022 when she tripped over the hose and sustained a right ankle fracture.  She went to the ED for initial evaluation and treatment.  She follows up today.  Review of Systems  Constitutional: Negative.   HENT: Negative.    Eyes: Negative.   Respiratory: Negative.    Cardiovascular: Negative.   Endocrine: Negative.   Musculoskeletal: Negative.   Neurological: Negative.   Hematological: Negative.   Psychiatric/Behavioral: Negative.    All other systems reviewed and are negative.    Objective: Vital Signs: There were no vitals taken for this visit.  Physical Exam Vitals and nursing note reviewed.   Constitutional:      Appearance: She is well-developed.  HENT:     Head: Atraumatic.     Nose: Nose normal.  Eyes:     Extraocular Movements: Extraocular movements intact.  Cardiovascular:     Pulses: Normal pulses.  Pulmonary:     Effort: Pulmonary effort is normal.  Abdominal:     Palpations: Abdomen is soft.  Musculoskeletal:     Cervical back: Neck supple.  Skin:    General: Skin is warm.     Capillary Refill: Capillary refill takes less than 2 seconds.  Neurological:     Mental Status: She is alert. Mental status is at baseline.  Psychiatric:        Behavior: Behavior normal.        Thought Content: Thought content normal.        Judgment: Judgment normal.    Ortho Exam  Examination right ankle shows moderate swelling and bruising.  No neurovascular compromise.  Skin wrinkles.  Specialty Comments:  No specialty comments available.  Imaging: No results found.   PMFS History: Patient Active Problem List   Diagnosis Date Noted   Closed displaced trimalleolar fracture of right lower leg 10/16/2022   Essential hypertension 09/05/2015   Hypothyroid 07/02/2012   Anxiety 07/02/2012   BMI 31.0-31.9,adult 07/02/2012  Past Medical History:  Diagnosis Date   Anxiety    GERD (gastroesophageal reflux disease)    HLD (hyperlipidemia)    Hypertension    Hypothyroidism     Family History  Problem Relation Age of Onset   Irritable bowel syndrome Mother    Diabetes Mother    Hyperlipidemia Mother    Liver cancer Mother        unsure of original site   Colon polyps Sister    Breast cancer Maternal Grandmother    Clotting disorder Nephew    Diabetes Nephew    Colon cancer Neg Hx    Esophageal cancer Neg Hx    Rectal cancer Neg Hx    Stomach cancer Neg Hx     Past Surgical History:  Procedure Laterality Date   CESAREAN SECTION     COLONOSCOPY     more than 10 years ago   WISDOM TOOTH EXTRACTION     Social History   Occupational History   Occupation:  Pharmacist, hospital   Occupation: Brewing technologist  Tobacco Use   Smoking status: Former    Types: Cigarettes    Quit date: 04/25/1985    Years since quitting: 37.5   Smokeless tobacco: Never  Vaping Use   Vaping Use: Never used  Substance and Sexual Activity   Alcohol use: No   Drug use: No   Sexual activity: Never

## 2022-10-16 NOTE — Telephone Encounter (Signed)
Patient requesting Oxycodone refill prior to surgery

## 2022-10-16 NOTE — Telephone Encounter (Signed)
Sent in percocet that she will be for after surgery as well.  Frequency has changed so make sure she is aware

## 2022-10-16 NOTE — Telephone Encounter (Signed)
Called and notified patient.

## 2022-10-17 ENCOUNTER — Telehealth: Payer: Self-pay

## 2022-10-17 ENCOUNTER — Telehealth: Payer: Self-pay | Admitting: Orthopaedic Surgery

## 2022-10-17 ENCOUNTER — Other Ambulatory Visit: Payer: Self-pay | Admitting: Family Medicine

## 2022-10-17 ENCOUNTER — Other Ambulatory Visit: Payer: Self-pay | Admitting: Physician Assistant

## 2022-10-17 DIAGNOSIS — F410 Panic disorder [episodic paroxysmal anxiety] without agoraphobia: Secondary | ICD-10-CM

## 2022-10-17 MED ORDER — ONDANSETRON HCL 4 MG PO TABS: 4.0000 mg | ORAL_TABLET | Freq: Three times a day (TID) | ORAL | 0 refills | Status: DC | PRN

## 2022-10-17 MED ORDER — HYDROCODONE-ACETAMINOPHEN 5-325 MG PO TABS: 1.0000 | ORAL_TABLET | Freq: Four times a day (QID) | ORAL | 0 refills | Status: DC | PRN

## 2022-10-17 MED ORDER — ALPRAZOLAM 0.5 MG PO TABS: 0.2500 mg | ORAL_TABLET | Freq: Two times a day (BID) | ORAL | 0 refills | Status: DC | PRN

## 2022-10-17 NOTE — Telephone Encounter (Signed)
Encourage patient to contact the pharmacy for refills or they can request refills through MYCHART  WHAT PHARMACY WOULD THEY LIKE THIS SENT TO:[id number] [place], [place] ALPRAZolam (XANAX) 0.5 MG tablet   NOTES/COMMENTS FROM PATIENT:      FBoazoffice please notify patient: It takes 48-72 hours to process rx refill requests Ask patient to call pharmacy to ensure rx is ready before heading there.

## 2022-10-17 NOTE — Telephone Encounter (Signed)
Sent in norco and zofran.  Pleaes make sure she does not take oxycodone any longer as she cannot take both norco and oxycodone

## 2022-10-17 NOTE — Telephone Encounter (Signed)
Patient is having some adverse reactions to pain medication. Sending call to Triage.

## 2022-10-17 NOTE — Telephone Encounter (Signed)
Called and notified patient.

## 2022-10-17 NOTE — Telephone Encounter (Signed)
Patient called stating that the Oxycodone is causing her to be nauseous and have panic attacks.  Would like to know if she could get another Rx for pain or Zofran.  Cb# [phone].  Please advise.  Thank you.

## 2022-10-17 NOTE — Telephone Encounter (Signed)
Patient is requesting a refill of the following medications: Requested Prescriptions   Pending Prescriptions Disp Refills   ALPRAZolam (XANAX) 0.5 MG tablet 10 tablet 0    Sig: Take 0.5-1 tablets (0.25-0.5 mg total) by mouth 2 (two) times daily as needed for anxiety (or panic attack.).    Date of patient request: 10/17/22 Last office visit: 10/04/22 Date of last refill: 03/06/21 Last refill amount: 10 Follow up time period per chart: 6 months

## 2022-10-17 NOTE — Telephone Encounter (Signed)
Patient states her medication need PA ---ASAP

## 2022-10-17 NOTE — Telephone Encounter (Signed)
Medication discussed at her February 29 visit.  Controlled substance database reviewed.  Last listing for alprazolam on 08/15/2021.  Will refill, but message sent to patient to not combine with oxycodone, listing noted from March 9.  Appears surgery is scheduled tomorrow for her trimalleolar fracture.

## 2022-10-18 ENCOUNTER — Ambulatory Visit (HOSPITAL_BASED_OUTPATIENT_CLINIC_OR_DEPARTMENT_OTHER): Payer: Medicaid Other | Admitting: Anesthesiology

## 2022-10-18 ENCOUNTER — Encounter (HOSPITAL_BASED_OUTPATIENT_CLINIC_OR_DEPARTMENT_OTHER): Payer: Self-pay | Admitting: Orthopaedic Surgery

## 2022-10-18 ENCOUNTER — Ambulatory Visit (HOSPITAL_BASED_OUTPATIENT_CLINIC_OR_DEPARTMENT_OTHER)
Admission: RE | Admit: 2022-10-18 | Discharge: 2022-10-18 | Disposition: A | Discharge status: Home / Self Care | Payer: Medicaid Other | Source: Ambulatory Visit | Attending: Orthopaedic Surgery | Admitting: Orthopaedic Surgery

## 2022-10-18 ENCOUNTER — Encounter (HOSPITAL_BASED_OUTPATIENT_CLINIC_OR_DEPARTMENT_OTHER)
Admission: RE | Disposition: A | Discharge status: Home / Self Care | Payer: Self-pay | Source: Ambulatory Visit | Attending: Orthopaedic Surgery

## 2022-10-18 ENCOUNTER — Ambulatory Visit (HOSPITAL_COMMUNITY): Payer: Medicaid Other

## 2022-10-18 DIAGNOSIS — F419 Anxiety disorder, unspecified: Secondary | ICD-10-CM | POA: Insufficient documentation

## 2022-10-18 DIAGNOSIS — E785 Hyperlipidemia, unspecified: Secondary | ICD-10-CM | POA: Insufficient documentation

## 2022-10-18 DIAGNOSIS — E039 Hypothyroidism, unspecified: Secondary | ICD-10-CM | POA: Diagnosis not present

## 2022-10-18 DIAGNOSIS — Z01818 Encounter for other preprocedural examination: Secondary | ICD-10-CM

## 2022-10-18 DIAGNOSIS — I10 Essential (primary) hypertension: Secondary | ICD-10-CM | POA: Diagnosis not present

## 2022-10-18 DIAGNOSIS — K219 Gastro-esophageal reflux disease without esophagitis: Secondary | ICD-10-CM | POA: Diagnosis not present

## 2022-10-18 DIAGNOSIS — Z6834 Body mass index (BMI) 34.0-34.9, adult: Secondary | ICD-10-CM | POA: Insufficient documentation

## 2022-10-18 DIAGNOSIS — S82851A Displaced trimalleolar fracture of right lower leg, initial encounter for closed fracture: Secondary | ICD-10-CM

## 2022-10-18 DIAGNOSIS — Z79890 Hormone replacement therapy (postmenopausal): Secondary | ICD-10-CM | POA: Insufficient documentation

## 2022-10-18 DIAGNOSIS — Z87891 Personal history of nicotine dependence: Secondary | ICD-10-CM | POA: Insufficient documentation

## 2022-10-18 DIAGNOSIS — E669 Obesity, unspecified: Secondary | ICD-10-CM | POA: Diagnosis not present

## 2022-10-18 DIAGNOSIS — X58XXXA Exposure to other specified factors, initial encounter: Secondary | ICD-10-CM | POA: Insufficient documentation

## 2022-10-18 DIAGNOSIS — G8918 Other acute postprocedural pain: Secondary | ICD-10-CM | POA: Diagnosis not present

## 2022-10-18 HISTORY — DX: Other specified postprocedural states: Z98.890

## 2022-10-18 HISTORY — PX: ORIF ANKLE FRACTURE: SHX5408

## 2022-10-18 HISTORY — DX: Nausea with vomiting, unspecified: R11.2

## 2022-10-18 SURGERY — OPEN REDUCTION INTERNAL FIXATION (ORIF) ANKLE FRACTURE
Anesthesia: Regional | Site: Ankle | Laterality: Right

## 2022-10-18 MED ORDER — FENTANYL CITRATE (PF) 100 MCG/2ML IJ SOLN
INTRAMUSCULAR | Status: AC
  Filled 2022-10-18: qty 2

## 2022-10-18 MED ORDER — AMISULPRIDE (ANTIEMETIC) 5 MG/2ML IV SOLN: 10.0000 mg | Freq: Once | INTRAVENOUS | Status: DC | PRN

## 2022-10-18 MED ORDER — PROPOFOL 10 MG/ML IV BOLUS
INTRAVENOUS | Status: DC | PRN
  Administered 2022-10-18: 200 mg via INTRAVENOUS

## 2022-10-18 MED ORDER — ACETAMINOPHEN 10 MG/ML IV SOLN: 1000.0000 mg | Freq: Once | INTRAVENOUS | Status: DC | PRN

## 2022-10-18 MED ORDER — ONDANSETRON HCL 4 MG/2ML IJ SOLN
INTRAMUSCULAR | Status: DC | PRN
  Administered 2022-10-18: 4 mg via INTRAVENOUS

## 2022-10-18 MED ORDER — ACETAMINOPHEN 500 MG PO TABS: 1000.0000 mg | ORAL_TABLET | Freq: Once | ORAL | Status: DC

## 2022-10-18 MED ORDER — BUPIVACAINE HCL (PF) 0.5 % IJ SOLN
INTRAMUSCULAR | Status: DC | PRN
  Administered 2022-10-18: 10 mL via PERINEURAL
  Administered 2022-10-18: 15 mL via PERINEURAL

## 2022-10-18 MED ORDER — MIDAZOLAM HCL 2 MG/2ML IJ SOLN
INTRAMUSCULAR | Status: AC
  Filled 2022-10-18: qty 2

## 2022-10-18 MED ORDER — PROMETHAZINE HCL 25 MG/ML IJ SOLN: 6.2500 mg | INTRAMUSCULAR | Status: DC | PRN

## 2022-10-18 MED ORDER — MIDAZOLAM HCL 2 MG/2ML IJ SOLN
2.0000 mg | Freq: Once | INTRAMUSCULAR | Status: AC
  Administered 2022-10-18: 2 mg via INTRAVENOUS

## 2022-10-18 MED ORDER — CEFAZOLIN SODIUM-DEXTROSE 2-4 GM/100ML-% IV SOLN
2.0000 g | INTRAVENOUS | Status: AC
  Administered 2022-10-18: 2 g via INTRAVENOUS

## 2022-10-18 MED ORDER — OXYCODONE HCL 5 MG PO TABS: 5.0000 mg | ORAL_TABLET | Freq: Once | ORAL | Status: DC | PRN

## 2022-10-18 MED ORDER — LACTATED RINGERS IV SOLN: INTRAVENOUS | Status: DC

## 2022-10-18 MED ORDER — BUPIVACAINE HCL (PF) 0.25 % IJ SOLN
INTRAMUSCULAR | Status: DC | PRN
  Administered 2022-10-18: 15 mL via PERINEURAL
  Administered 2022-10-18: 10 mL via PERINEURAL

## 2022-10-18 MED ORDER — ACETAMINOPHEN 500 MG PO TABS
ORAL_TABLET | ORAL | Status: AC
  Filled 2022-10-18: qty 2

## 2022-10-18 MED ORDER — CEFAZOLIN SODIUM-DEXTROSE 2-4 GM/100ML-% IV SOLN
INTRAVENOUS | Status: AC
  Filled 2022-10-18: qty 100

## 2022-10-18 MED ORDER — PROPOFOL 500 MG/50ML IV EMUL
INTRAVENOUS | Status: DC | PRN
  Administered 2022-10-18: 165 ug/kg/min via INTRAVENOUS

## 2022-10-18 MED ORDER — DEXAMETHASONE SODIUM PHOSPHATE 10 MG/ML IJ SOLN
INTRAMUSCULAR | Status: DC | PRN
  Administered 2022-10-18: 4 mg via INTRAVENOUS

## 2022-10-18 MED ORDER — DEXMEDETOMIDINE HCL IN NACL 80 MCG/20ML IV SOLN
INTRAVENOUS | Status: DC | PRN
  Administered 2022-10-18: 8 ug via BUCCAL

## 2022-10-18 MED ORDER — OXYCODONE HCL 5 MG/5ML PO SOLN: 5.0000 mg | Freq: Once | ORAL | Status: DC | PRN

## 2022-10-18 MED ORDER — FENTANYL CITRATE (PF) 100 MCG/2ML IJ SOLN: 25.0000 ug | INTRAMUSCULAR | Status: DC | PRN

## 2022-10-18 SURGICAL SUPPLY — 99 items
BANDAGE ESMARK 6X9 LF (GAUZE/BANDAGES/DRESSINGS) ×1 IMPLANT
BIT DRILL 2.4X140 LONG SOLID (BIT) IMPLANT
BIT DRILL CANNULTD 2.6 X 130MM (DRILL) IMPLANT
BLADE HEX COATED 2.75 (ELECTRODE) IMPLANT
BLADE SURG 15 STRL LF DISP TIS (BLADE) ×2 IMPLANT
BLADE SURG 15 STRL SS (BLADE) ×2
BNDG CMPR 5X6 CHSV STRCH STRL (GAUZE/BANDAGES/DRESSINGS) ×1
BNDG CMPR 5X62 HK CLSR LF (GAUZE/BANDAGES/DRESSINGS) ×1
BNDG CMPR 6 X 5 YARDS HK CLSR (GAUZE/BANDAGES/DRESSINGS) ×1
BNDG CMPR 6"X 5 YARDS HK CLSR (GAUZE/BANDAGES/DRESSINGS) ×1
BNDG CMPR 9X6 STRL LF SNTH (GAUZE/BANDAGES/DRESSINGS) ×1
BNDG COHESIVE 6X5 TAN ST LF (GAUZE/BANDAGES/DRESSINGS) ×1 IMPLANT
BNDG ELASTIC 4X5.8 VLCR STR LF (GAUZE/BANDAGES/DRESSINGS) IMPLANT
BNDG ELASTIC 6INX 5YD STR LF (GAUZE/BANDAGES/DRESSINGS) ×1 IMPLANT
BNDG ESMARK 6X9 LF (GAUZE/BANDAGES/DRESSINGS) ×1
BRUSH SCRUB EZ PLAIN DRY (MISCELLANEOUS) ×1 IMPLANT
CANISTER SUCT 1200ML W/VALVE (MISCELLANEOUS) ×1 IMPLANT
COVER BACK TABLE 60X90IN (DRAPES) ×1 IMPLANT
COVER MAYO STAND STRL (DRAPES) IMPLANT
CUFF TOURN SGL QUICK 34 (TOURNIQUET CUFF)
CUFF TRNQT CYL 34X4.125X (TOURNIQUET CUFF) IMPLANT
DRAPE C-ARM 42X72 X-RAY (DRAPES) ×1 IMPLANT
DRAPE C-ARMOR (DRAPES) ×1 IMPLANT
DRAPE EXTREMITY T 121X128X90 (DISPOSABLE) ×1 IMPLANT
DRAPE IMP U-DRAPE 54X76 (DRAPES) ×1 IMPLANT
DRAPE INCISE IOBAN 66X45 STRL (DRAPES) IMPLANT
DRAPE SURG 17X23 STRL (DRAPES) ×2 IMPLANT
DRAPE U-SHAPE 47X51 STRL (DRAPES) ×1 IMPLANT
DRILL CANNULATED 2.6 X 130MM (DRILL) ×1
DURAPREP 26ML APPLICATOR (WOUND CARE) ×2 IMPLANT
ELECT REM PT RETURN 9FT ADLT (ELECTROSURGICAL) ×1
ELECTRODE REM PT RTRN 9FT ADLT (ELECTROSURGICAL) ×1 IMPLANT
GAUZE PAD ABD 8X10 STRL (GAUZE/BANDAGES/DRESSINGS) ×2 IMPLANT
GAUZE SPONGE 4X4 12PLY STRL (GAUZE/BANDAGES/DRESSINGS) ×1 IMPLANT
GAUZE XEROFORM 1X8 LF (GAUZE/BANDAGES/DRESSINGS) ×1 IMPLANT
GLOVE ECLIPSE 7.0 STRL STRAW (GLOVE) ×1 IMPLANT
GLOVE INDICATOR 7.0 STRL GRN (GLOVE) ×1 IMPLANT
GLOVE INDICATOR 7.5 STRL GRN (GLOVE) ×1 IMPLANT
GLOVE SURG SYN 7.5  E (GLOVE) ×1
GLOVE SURG SYN 7.5 E (GLOVE) ×1 IMPLANT
GLOVE SURG SYN 7.5 PF PI (GLOVE) ×1 IMPLANT
GOWN STRL REUS W/ TWL LRG LVL3 (GOWN DISPOSABLE) ×1 IMPLANT
GOWN STRL REUS W/ TWL XL LVL3 (GOWN DISPOSABLE) ×1 IMPLANT
GOWN STRL REUS W/TWL LRG LVL3 (GOWN DISPOSABLE) ×1
GOWN STRL REUS W/TWL XL LVL3 (GOWN DISPOSABLE) ×1
GOWN STRL SURGICAL XL XLNG (GOWN DISPOSABLE) ×1 IMPLANT
K-WIRE SNGL END 1.2X150 (MISCELLANEOUS) ×4
KWIRE SNGL END 1.2X150 (MISCELLANEOUS) IMPLANT
MANIFOLD NEPTUNE II (INSTRUMENTS) ×1 IMPLANT
NDL HYPO 22X1.5 SAFETY MO (MISCELLANEOUS) IMPLANT
NEEDLE HYPO 22X1.5 SAFETY MO (MISCELLANEOUS) IMPLANT
NEEDLE SAFETY HYPO 22GAX1.5 (MISCELLANEOUS)
NS IRRIG 1000ML POUR BTL (IV SOLUTION) ×1 IMPLANT
PACK BASIN DAY SURGERY FS (CUSTOM PROCEDURE TRAY) ×1 IMPLANT
PAD CAST 3X4 CTTN HI CHSV (CAST SUPPLIES) IMPLANT
PAD CAST 4YDX4 CTTN HI CHSV (CAST SUPPLIES) IMPLANT
PADDING CAST COTTON 3X4 STRL (CAST SUPPLIES)
PADDING CAST COTTON 4X4 STRL (CAST SUPPLIES)
PADDING CAST COTTON 6X4 STRL (CAST SUPPLIES) IMPLANT
PADDING CAST SYNTHETIC 4X4 STR (CAST SUPPLIES) ×1 IMPLANT
PADDING CAST SYNTHETIC 6X4 NS (CAST SUPPLIES) ×1 IMPLANT
PENCIL SMOKE EVACUATOR (MISCELLANEOUS) ×1 IMPLANT
PLATE FIB 11H ANATOMICAL RT (Plate) IMPLANT
SCREW 3.5X16 NONLOCKING (Screw) IMPLANT
SCREW CANN 4.0X40 SHORT THREAD (Screw) IMPLANT
SCREW CANN FT 4X38 (Screw) IMPLANT
SCREW CANN HDLS LT 4X36 (Screw) IMPLANT
SCREW CANNULATED 4.0X36S (Screw) IMPLANT
SCREW LOCK PLATE R3 4.2X14 (Screw) ×1 IMPLANT
SCREW LOCK PLATE R3 4.2X16 (Screw) ×1 IMPLANT
SCREW LOCK PLT 14X4.2XNS GRLL (Screw) IMPLANT
SCREW LOCK PLT 16X4.2X GRLL (Screw) IMPLANT
SCREW NLOCK PLATE RECON 3.5X10 (Screw) IMPLANT
SCREW NON LOCKING 3.5X12 (Screw) IMPLANT
SHEET MEDIUM DRAPE 40X70 STRL (DRAPES) ×2 IMPLANT
SLEEVE SCD COMPRESS KNEE MED (STOCKING) ×1 IMPLANT
SPIKE FLUID TRANSFER (MISCELLANEOUS) IMPLANT
SPLINT FIBERGLASS 4X30 (CAST SUPPLIES) IMPLANT
SPONGE T-LAP 18X18 ~~LOC~~+RFID (SPONGE) ×1 IMPLANT
STOCKINETTE 6  STRL (DRAPES) ×1
STOCKINETTE 6 STRL (DRAPES) ×1 IMPLANT
STOCKINETTE TUBULAR 6 INCH (GAUZE/BANDAGES/DRESSINGS) ×1 IMPLANT
SUCTION FRAZIER HANDLE 10FR (MISCELLANEOUS) ×1
SUCTION TUBE FRAZIER 10FR DISP (MISCELLANEOUS) ×1 IMPLANT
SUT ETHILON 3 0 PS 1 (SUTURE) IMPLANT
SUT VIC AB 0 CT1 27 (SUTURE)
SUT VIC AB 0 CT1 27XBRD ANBCTR (SUTURE) IMPLANT
SUT VIC AB 2-0 CT1 27 (SUTURE) ×1
SUT VIC AB 2-0 CT1 TAPERPNT 27 (SUTURE) ×1 IMPLANT
SUT VIC AB 3-0 SH 27 (SUTURE)
SUT VIC AB 3-0 SH 27X BRD (SUTURE) IMPLANT
SYR BULB EAR ULCER 3OZ GRN STR (SYRINGE) IMPLANT
SYR CONTROL 10ML LL (SYRINGE) IMPLANT
TOWEL GREEN STERILE FF (TOWEL DISPOSABLE) ×1 IMPLANT
TRAY DSU PREP LF (CUSTOM PROCEDURE TRAY) ×1 IMPLANT
TUBE CONNECTING 20X1/4 (TUBING) ×1 IMPLANT
UNDERPAD 30X36 HEAVY ABSORB (UNDERPADS AND DIAPERS) ×1 IMPLANT
WIRE OLIVE SMOOTH 1.4MMX60MM (WIRE) IMPLANT
YANKAUER SUCT BULB TIP NO VENT (SUCTIONS) ×1 IMPLANT

## 2022-10-18 NOTE — Op Note (Signed)
Date of Surgery: 10/18/2022  INDICATIONS: Ms. Christine [name] is a 62 y.o.-year-old female who sustained a right ankle fracture; she was indicated for open reduction and internal fixation due to the displaced nature of the articular fracture and came to the operating room today for this procedure. The patient did consent to the procedure after discussion of the risks and benefits.  PREOPERATIVE DIAGNOSIS: right trimalleolar ankle fracture  POSTOPERATIVE DIAGNOSIS: Same.  PROCEDURE: Open treatment of right ankle fracture with internal fixation.  Trimalleolar w/ fixation of posterior malleolus CPT 27823.  SURGEON: Christine [name], M.D.  ASSIST: Christine Rob, PA-C  ANESTHESIA:  general, block  TOURNIQUET TIME: see anesthesia record  IV FLUIDS AND URINE: See anesthesia.  ESTIMATED BLOOD LOSS: minimal mL.  IMPLANTS: Implant Name Type Inv. Item Serial No. Manufacturer Lot No. LRB No. Used Action  SCREW NON LOCKING 3.5X12 - [id number] Screw SCREW NON LOCKING 3.5X12  [employer]  Right 1 Implanted  SCREW 3.5X16 NONLOCKING - [id number] Screw SCREW 3.5X16 NONLOCKING  [employer]  Right 1 Implanted  SCREW NLOCK PLATE RECON QA348G - [id number] Screw SCREW NLOCK PLATE RECON QA348G  [employer]  Right 3 Implanted  right anatomic fibula plate Plate   [employer]  Right 1 Implanted    COMPLICATIONS: see description of procedure.  DESCRIPTION OF PROCEDURE: The patient was brought to the operating room and placed supine on the operating table.  The patient had been signed prior to the procedure and this was documented. The patient had the anesthesia placed by the anesthesiologist.  A nonsterile tourniquet was placed on the upper thigh.  The prep verification and incision time-outs were performed to confirm that this was the correct patient, site, side and location. The patient had an SCD on the opposite lower extremity. The patient did receive antibiotics prior to the incision and  was re-dosed during the procedure as needed at indicated intervals.  The patient had the lower extremity prepped and draped in the standard surgical fashion.  The extremity was exsanguinated using an esmarch bandage and the tourniquet was inflated to 275 mm Hg.  An incision was first made over the lateral aspect of the ankle over the fibula.  Dissection was taken down to the fibular shaft and subperiosteal elevation was performed.  The fracture was exposed.  Organized hematoma was removed.  Fracture was a long oblique pattern.  This was reduced and clamped.  A lag screw was placed which gave excellent compression and fixation across the fracture.  A precontoured 11 hole distal fibula plate was placed on the fibula using fluoroscopic guidance.  2 bicortical screws were placed proximal to the fracture using fluoroscopic guidance.  Each screw had excellent purchase.  I then placed 2 distal unicortical screws 1 cancellous and 1 locking cancellous using fluoroscopic guidance.  Each screw had excellent purchase as well.  Given the orientation of the fracture I could only fit 2 screws into the distal fracture segment.  I then placed 2 additional nonlocking bicortical screws proximal to the fracture using fluoroscopic guidance.  Was happy with the fixation and reduction of the fibula.  I then turned my attention to fixation of the posterior malleolus.  I was not able to completely anatomically reduce the posterior malleolus.  There was still about half a millimeter of step-off which I felt should not be clinically significant.  2 parallel K wires were percutaneously inserted through the anterior skin across the distal tibia and across the  fracture into the posterior malleolus using fluoroscopic guidance.  The length of the screws were measured and drilled and cannulated screws were advanced over the K wire which allowed compression across the fracture.  I then turned my attention to the medial malleolus.  Given the  anterior medial abrasion I elected to do this percutaneously as I felt that the medial malleolus was near anatomic reduction.  Again to K wires were percutaneously inserted across the medial malleolus fracture using fluoroscopic guidance.  I then placed a 36 mm partially-threaded screw across the anterior pin to gain compression.  I placed a 38 mm fully threaded screw across the posterior pin to maintain the fracture reduction.  Stress exam of the ankle was then performed.  No syndesmosis injury was identified.  The surgical sites were thoroughly irrigated and closed in layered fashion.  Sterile dressings were applied.  Short leg splint was placed.  Patient tolerated procedure well had no many complications. Christine [name], my PA, was a medical necessity for the entirety of the surgery including opening, closing, limb positioning, retracting, exposing, and repairing.  POSTOPERATIVE PLAN: Christine [name] will remain nonweightbearing on this leg for approximately 6 weeks; Christine [name] will return for suture removal in 2 weeks.  He will be immobilized in a short leg splint and then transitioned to a CAM walker at his first follow up appointment.  Christine [name] will receive DVT prophylaxis based on other medications, activity level, and risk ratio of bleeding to thrombosis.  Christine Cecil, MD [facility] 2:28 PM

## 2022-10-18 NOTE — Progress Notes (Signed)
Assisted Dr. Ellender with right, adductor canal, popliteal, ultrasound guided block. Side rails up, monitors on throughout procedure. See vital signs in flow sheet. Tolerated Procedure well. ?

## 2022-10-18 NOTE — Anesthesia Procedure Notes (Signed)
Anesthesia Regional Block: Adductor canal block   Pre-Anesthetic Checklist: , timeout performed,  Correct Patient, Correct Site, Correct Laterality,  Correct Procedure,, site marked,  Risks and benefits discussed,  Surgical consent,  Pre-op evaluation,  At surgeon's request and post-op pain management  Laterality: Right  Prep: chloraprep       Needles:  Injection technique: Single-shot  Needle Type: Echogenic Stimulator Needle     Needle Length: 9cm  Needle Gauge: 21     Additional Needles:   Procedures:,,,, ultrasound used (permanent image in chart),,    Narrative:  Start time: 10/18/2022 12:30 PM End time: 10/18/2022 12:40 PM Injection made incrementally with aspirations every 5 mL.  Performed by: Personally  Anesthesiologist: Murvin Natal, MD  Additional Notes: Functioning IV was confirmed and monitors were applied. A time-out was performed. Hand hygiene and sterile gloves were used. The thigh was placed in a frog-leg position and prepped in a sterile fashion. A 108m 21ga Arrow echogenic stimulator needle was placed using ultrasound guidance.  Negative aspiration and negative test dose prior to incremental administration of local anesthetic. The patient tolerated the procedure well.

## 2022-10-18 NOTE — Discharge Instructions (Addendum)
    1. Keep splint clean and dry 2. Elevate foot above level of the heart 3. Take aspirin to prevent blood clots 4. Take pain meds as needed 5. Strict non weight bearing to operative extremity   Post Anesthesia Home Care Instructions  Activity: Get plenty of rest for the remainder of the day. A responsible individual must stay with you for 24 hours following the procedure.  For the next 24 hours, DO NOT: -Drive a car -Operate machinery -Drink alcoholic beverages -Take any medication unless instructed by your physician -Make any legal decisions or sign important papers.  Meals: Start with liquid foods such as gelatin or soup. Progress to regular foods as tolerated. Avoid greasy, spicy, heavy foods. If nausea and/or vomiting occur, drink only clear liquids until the nausea and/or vomiting subsides. Call your physician if vomiting continues.  Special Instructions/Symptoms: Your throat may feel dry or sore from the anesthesia or the breathing tube placed in your throat during surgery. If this causes discomfort, gargle with warm salt water. The discomfort should disappear within 24 hours.  If you had a scopolamine patch placed behind your ear for the management of post- operative nausea and/or vomiting:  1. The medication in the patch is effective for 72 hours, after which it should be removed.  Wrap patch in a tissue and discard in the trash. Wash hands thoroughly with soap and water. 2. You may remove the patch earlier than 72 hours if you experience unpleasant side effects which may include dry mouth, dizziness or visual disturbances. 3. Avoid touching the patch. Wash your hands with soap and water after contact with the patch.  Regional Anesthesia Blocks  1. Numbness or the inability to move the "blocked" extremity may last from 3-48 hours after placement. The length of time depends on the medication injected and your individual response to the medication. If the numbness is not  going away after 48 hours, call your surgeon.  2. The extremity that is blocked will need to be protected until the numbness is gone and the  Strength has returned. Because you cannot feel it, you will need to take extra care to avoid injury. Because it may be weak, you may have difficulty moving it or using it. You may not know what position it is in without looking at it while the block is in effect.  3. For blocks in the legs and feet, returning to weight bearing and walking needs to be done carefully. You will need to wait until the numbness is entirely gone and the strength has returned. You should be able to move your leg and foot normally before you try and bear weight or walk. You will need someone to be with you when you first try to ensure you do not fall and possibly risk injury.  4. Bruising and tenderness at the needle site are common side effects and will resolve in a few days.  5. Persistent numbness or new problems with movement should be communicated to the surgeon or the [facility] ([phone])/ [facility] ([phone]).      

## 2022-10-18 NOTE — Anesthesia Procedure Notes (Signed)
Anesthesia Regional Block: Popliteal block   Pre-Anesthetic Checklist: , timeout performed,  Correct Patient, Correct Site, Correct Laterality,  Correct Procedure, Correct Position, site marked,  Risks and benefits discussed,  Surgical consent,  Pre-op evaluation,  At surgeon's request and post-op pain management  Laterality: Right  Prep: chloraprep       Needles:  Injection technique: Single-shot  Needle Type: Echogenic Stimulator Needle     Needle Length: 10cm  Needle Gauge: 20     Additional Needles:   Procedures:,,,, ultrasound used (permanent image in chart),,    Narrative:  Start time: 10/18/2022 12:20 PM End time: 10/18/2022 12:30 PM Injection made incrementally with aspirations every 5 mL.  Performed by: Personally  Anesthesiologist: Murvin Natal, MD  Additional Notes: Functioning IV was confirmed and monitors were applied.  A timeout was performed. Sterile prep, hand hygiene and sterile gloves were used. A 160m 20ga Bbraun echogenic stimulator needle was used. Negative aspiration and negative test dose prior to incremental administration of local anesthetic. The patient tolerated the procedure well.  Ultrasound guidance: relevent anatomy identified, needle position confirmed, local anesthetic spread visualized around nerve(s), vascular puncture avoided.  Image printed for medical record.

## 2022-10-18 NOTE — Transfer of Care (Signed)
Immediate Anesthesia Transfer of Care Note  Patient: Christine [name]  Procedure(s) Performed: OPEN REDUCTION INTERNAL FIXATION (ORIF) RIGHT TRIMALLEOLAR (Right: Ankle)  Patient Location: PACU  Anesthesia Type:GA combined with regional for post-op pain  Level of Consciousness: drowsy and patient cooperative  Airway & Oxygen Therapy: Patient Spontanous Breathing and Patient connected to face mask oxygen  Post-op Assessment: Report given to RN and Post -op Vital signs reviewed and stable  Post vital signs: Reviewed and stable  Last Vitals:  Vitals Value Taken Time  BP    Temp    Pulse    Resp    SpO2      Last Pain:  Vitals:   10/18/22 1129  TempSrc: Oral  PainSc: 0-No pain      Patients Stated Pain Goal: 1 ([id number])  Complications: No notable events documented.

## 2022-10-18 NOTE — Anesthesia Preprocedure Evaluation (Addendum)
Anesthesia Evaluation  Patient identified by MRN, date of birth, ID band Patient awake    Reviewed: Allergy & Precautions, NPO status , Patient's Chart, lab work & pertinent test results  History of Anesthesia Complications (+) PONV and history of anesthetic complications  Airway Mallampati: II  TM Distance: >3 FB Neck ROM: Full    Dental no notable dental hx.    Pulmonary former smoker   Pulmonary exam normal        Cardiovascular hypertension, Pt. on medications Normal cardiovascular exam     Neuro/Psych   Anxiety     negative neurological ROS     GI/Hepatic Neg liver ROS,GERD  Medicated and Controlled,,  Endo/Other  negative endocrine ROSHypothyroidism    Renal/GU negative Renal ROS     Musculoskeletal negative musculoskeletal ROS (+)    Abdominal  (+) + obese  Peds  Hematology negative hematology ROS (+)   Anesthesia Other Findings right trimalleolar fracture  Reproductive/Obstetrics                             Anesthesia Physical Anesthesia Plan  ASA: 2  Anesthesia Plan: General and Regional   Post-op Pain Management: Regional block*   Induction: Intravenous  PONV Risk Score and Plan: 4 or greater and Ondansetron, Dexamethasone, Midazolam, Propofol infusion and Treatment may vary due to age or medical condition  Airway Management Planned: LMA  Additional Equipment:   Intra-op Plan:   Post-operative Plan: Extubation in OR  Informed Consent: I have reviewed the patients History and Physical, chart, labs and discussed the procedure including the risks, benefits and alternatives for the proposed anesthesia with the patient or authorized representative who has indicated his/her understanding and acceptance.     Dental advisory given  Plan Discussed with: CRNA  Anesthesia Plan Comments:        Anesthesia Quick Evaluation

## 2022-10-18 NOTE — Anesthesia Procedure Notes (Signed)
Procedure Name: LMA Insertion Date/Time: 10/18/2022 1:13 PM  Performed by: Tomiko Schoon, Ernesta Amble, CRNAPre-anesthesia Checklist: Patient identified, Emergency Drugs available, Suction available and Patient being monitored Patient Re-evaluated:Patient Re-evaluated prior to induction Oxygen Delivery Method: Circle system utilized Preoxygenation: Pre-oxygenation with 100% oxygen Induction Type: IV induction Ventilation: Mask ventilation without difficulty LMA: LMA inserted LMA Size: 4.0 Number of attempts: 1 Airway Equipment and Method: Bite block Placement Confirmation: positive ETCO2 Tube secured with: Tape Dental Injury: Teeth and Oropharynx as per pre-operative assessment

## 2022-10-18 NOTE — H&P (Signed)
PREOPERATIVE H&P  Chief Complaint: right trimalleolar fracture  HPI: Christine [name] is a 62 y.o. female who presents for surgical treatment of right trimalleolar fracture.  She denies any changes in medical history.  Past Medical History:  Diagnosis Date   Anxiety    GERD (gastroesophageal reflux disease)    HLD (hyperlipidemia)    Hypertension    Hypothyroidism    PONV (postoperative nausea and vomiting)    Past Surgical History:  Procedure Laterality Date   CESAREAN SECTION     COLONOSCOPY     more than 10 years ago   WISDOM TOOTH EXTRACTION     Social History   Socioeconomic History   Marital status: Widowed    Spouse name: Not on file   Number of children: 2   Years of education: Not on file   Highest education level: Not on file  Occupational History   Occupation: Pharmacist, hospital   Occupation: Brewing technologist  Tobacco Use   Smoking status: Former    Types: Cigarettes    Quit date: 04/25/1985    Years since quitting: 37.5   Smokeless tobacco: Never  Vaping Use   Vaping Use: Never used  Substance and Sexual Activity   Alcohol use: No   Drug use: No   Sexual activity: Never  Other Topics Concern   Not on file  Social History Narrative   Not on file   Social Determinants of Health   Financial Resource Strain: Not on file  Food Insecurity: Not on file  Transportation Needs: Not on file  Physical Activity: Not on file  Stress: Not on file  Social Connections: Not on file   Family History  Problem Relation Age of Onset   Irritable bowel syndrome Mother    Diabetes Mother    Hyperlipidemia Mother    Liver cancer Mother        unsure of original site   Colon polyps Sister    Breast cancer Maternal Grandmother    Clotting disorder Nephew    Diabetes Nephew    Colon cancer Neg Hx    Esophageal cancer Neg Hx    Rectal cancer Neg Hx    Stomach cancer Neg Hx    Allergies  Allergen Reactions   Codeine Nausea Only   Prior to Admission medications    Medication Sig Start Date End Date Taking? Authorizing Provider  doxycycline (VIBRA-TABS) 100 MG tablet Take 1 tablet (100 mg total) by mouth 2 (two) times daily. 10/04/22  Yes Wendie Agreste, MD  FLUoxetine (PROZAC) 10 MG tablet Take 1 tablet (10 mg total) by mouth daily. 10/04/22  Yes Wendie Agreste, MD  hydrochlorothiazide (MICROZIDE) 12.5 MG capsule Take 1 capsule (12.5 mg total) by mouth daily. 10/04/22  Yes Wendie Agreste, MD  HYDROcodone-acetaminophen (NORCO) 5-325 MG tablet Take 1 tablet by mouth every 6 (six) hours as needed. 10/17/22  Yes Aundra Dubin, PA-C  levothyroxine (SYNTHROID) 137 MCG tablet Take 1 tablet (137 mcg total) by mouth daily. 10/04/22  Yes Wendie Agreste, MD  omeprazole (PRILOSEC) 40 MG capsule TAKE 1 CAPSULE (40 MG TOTAL) BY MOUTH DAILY. 09/03/22  Yes Wendie Agreste, MD  ondansetron (ZOFRAN) 4 MG tablet Take 1 tablet (4 mg total) by mouth every 8 (eight) hours as needed for nausea or vomiting. 10/17/22   Aundra Dubin, PA-C  ALPRAZolam Duanne Moron) 0.5 MG tablet Take 0.5-1 tablets (0.25-0.5 mg total) by mouth 2 (two) times daily as needed for anxiety (  or panic attack. do not combine with narcotic pain medication.). 10/17/22   Wendie Agreste, MD  oxyCODONE-acetaminophen (PERCOCET/ROXICET) 5-325 MG tablet Take 1 tablet by mouth every 6 (six) hours as needed for severe pain. 10/16/22   Aundra Dubin, PA-C     Positive ROS: All other systems have been reviewed and were otherwise negative with the exception of those mentioned in the HPI and as above.  Physical Exam: General: Alert, no acute distress Cardiovascular: No pedal edema Respiratory: No cyanosis, no use of accessory musculature GI: abdomen soft Skin: No lesions in the area of chief complaint Neurologic: Sensation intact distally Psychiatric: Patient is competent for consent with normal mood and affect Lymphatic: no lymphedema  MUSCULOSKELETAL: exam stable  Assessment: right trimalleolar  fracture  Plan: Plan for Procedure(s): OPEN REDUCTION INTERNAL FIXATION (ORIF) RIGHT TRIMALLEOLAR  The risks benefits and alternatives were discussed with the patient including but not limited to the risks of nonoperative treatment, versus surgical intervention including infection, bleeding, nerve injury,  blood clots, cardiopulmonary complications, morbidity, mortality, among others, and they were willing to proceed.   Eduard Roux, MD 10/18/2022 11:27 AM

## 2022-10-18 NOTE — Telephone Encounter (Signed)
Spoke with patient about this medication refill.

## 2022-10-19 ENCOUNTER — Telehealth: Payer: Self-pay | Admitting: Orthopedic Surgery

## 2022-10-19 ENCOUNTER — Other Ambulatory Visit: Payer: Self-pay | Admitting: Physician Assistant

## 2022-10-19 MED ORDER — OXYCODONE-ACETAMINOPHEN 5-325 MG PO TABS: 1.0000 | ORAL_TABLET | Freq: Four times a day (QID) | ORAL | 0 refills | Status: AC | PRN

## 2022-10-19 NOTE — Telephone Encounter (Signed)
Sent.  Cannot take anymore norco if taking the oxycodone

## 2022-10-19 NOTE — Anesthesia Postprocedure Evaluation (Signed)
Anesthesia Post Note  Patient: Christine [name]  Procedure(s) Performed: OPEN REDUCTION INTERNAL FIXATION (ORIF) RIGHT TRIMALLEOLAR (Right: Ankle)     Patient location during evaluation: PACU Anesthesia Type: Regional and General Level of consciousness: awake and alert Pain management: pain level controlled Vital Signs Assessment: post-procedure vital signs reviewed and stable Respiratory status: spontaneous breathing, nonlabored ventilation, respiratory function stable and patient connected to nasal cannula oxygen Cardiovascular status: blood pressure returned to baseline and stable Postop Assessment: no apparent nausea or vomiting Anesthetic complications: no   No notable events documented.  Last Vitals:  Vitals:   10/18/22 1515 10/18/22 1535  BP: (!) 143/82 (!) 147/78  Pulse: 72 70  Resp: (!) 21 18  Temp:  36.5 C  SpO2: 97% 97%    Last Pain:  Vitals:   10/18/22 1535  TempSrc:   PainSc: 0-No pain                 Effie Berkshire

## 2022-10-19 NOTE — Telephone Encounter (Signed)
Patient states that she can manage the panic attacks and wants the oxycodone.

## 2022-10-19 NOTE — Telephone Encounter (Signed)
Pt called asking for a 7 day supply of oxycodone instead of 10 day supply. Please call pt about this matter at [phone].

## 2022-10-19 NOTE — Telephone Encounter (Signed)
I thought the oxycodone was making her nauseous and cause panic attacks and that is why norco was sent in two days ago?  I am confused.  What has she been taking?

## 2022-10-22 ENCOUNTER — Telehealth: Payer: Self-pay | Admitting: Orthopaedic Surgery

## 2022-10-22 ENCOUNTER — Encounter (HOSPITAL_BASED_OUTPATIENT_CLINIC_OR_DEPARTMENT_OTHER): Payer: Self-pay | Admitting: Orthopaedic Surgery

## 2022-10-22 NOTE — Telephone Encounter (Signed)
I don't remember telling her to follow up in 1 week.  2 weeks is fine.

## 2022-10-22 NOTE — Telephone Encounter (Signed)
Tried to call patient. No answer. LMOM that we will keep appt the same. Ask her to call if she has any other questions.

## 2022-10-22 NOTE — Telephone Encounter (Signed)
Patient has questions about her P/O appointment. Please advise

## 2022-10-22 NOTE — Telephone Encounter (Signed)
Patient had ORIF trimall. She is scheduled for a 2week postop appt but states that you told her to come in 1 week postop.  If so, I'll call and reschedule patient. Please advise.

## 2022-10-23 ENCOUNTER — Telehealth: Payer: Self-pay

## 2022-10-23 DIAGNOSIS — E039 Hypothyroidism, unspecified: Secondary | ICD-10-CM

## 2022-10-23 NOTE — Telephone Encounter (Signed)
Spoke to pt about results. Question of whether she is taking 137 mcg or 150 mcg pt will call back to inform us once she has had the chance to double check at home   (Per chart should have been 137 since August but thinks she has been taking 150)

## 2022-10-23 NOTE — Telephone Encounter (Signed)
-----   Message from Wendie Agreste, MD sent at 10/22/2022  9:49 PM EDT ----- Results sent by MyChart, but appears patient has not yet reviewed those results.  Please call and make sure they have either seen note or discuss result note.  Thanks.

## 2022-10-24 NOTE — Telephone Encounter (Signed)
Patient states that she has been taking 150 mcg since Oct 2023. Patient has been taking 137 mcg for two week now.

## 2022-10-24 NOTE — Telephone Encounter (Signed)
With this information how do you want to proceed given TSH in good range?

## 2022-10-24 NOTE — Telephone Encounter (Signed)
Noted.  Lab only visit in the next 1 month for repeat TSH, either scheduled at our office or at [facility] location.  Thanks

## 2022-10-24 NOTE — Addendum Note (Signed)
Addended by: Merri Ray R on: 10/24/2022 05:44 PM   Modules accepted: Orders

## 2022-10-25 NOTE — Telephone Encounter (Signed)
For clarity: Patient should continue the 137 mcg dose that she started 2 weeks ago and recheck in 4 weeks

## 2022-10-25 NOTE — Telephone Encounter (Signed)
Called and LM informing her to stay on 193mcg dose that she started retest her TSH in 4 weeks and we will re assess after.

## 2022-10-25 NOTE — Telephone Encounter (Signed)
Yes, sorry if that was not clear.  Continue 137 mcg dose and we can check levels as planned to decide if higher dosing needed.  Stay on 137 mcg for now.

## 2022-10-29 ENCOUNTER — Telehealth: Payer: Self-pay

## 2022-10-29 NOTE — Telephone Encounter (Signed)
Pt called and stated she has a rash under her cast. No fever or chills. Pt would like this evaluated sooner than Thursday. I worked pt in to see erin tomorrow to have her look at it.

## 2022-10-30 ENCOUNTER — Ambulatory Visit (INDEPENDENT_AMBULATORY_CARE_PROVIDER_SITE_OTHER): Payer: Worker's Compensation | Admitting: Family

## 2022-10-30 ENCOUNTER — Other Ambulatory Visit (INDEPENDENT_AMBULATORY_CARE_PROVIDER_SITE_OTHER): Payer: Worker's Compensation

## 2022-10-30 DIAGNOSIS — S82851A Displaced trimalleolar fracture of right lower leg, initial encounter for closed fracture: Secondary | ICD-10-CM

## 2022-10-31 ENCOUNTER — Encounter: Payer: Medicaid Other | Admitting: Surgical

## 2022-11-05 DIAGNOSIS — Z419 Encounter for procedure for purposes other than remedying health state, unspecified: Secondary | ICD-10-CM | POA: Diagnosis not present

## 2022-11-07 ENCOUNTER — Encounter: Payer: Self-pay | Admitting: Family

## 2022-11-07 NOTE — Progress Notes (Signed)
   Post-Op Visit Note   Patient: Christine [name]           Date of Birth: Apr 08, 1961           MRN: [id number] Visit Date: 10/30/2022 PCP: Wendie Agreste, MD  Chief Complaint:  Chief Complaint  Patient presents with   Right Ankle - Routine Post Op    10/18/2022 trimal ankle fx with Dr. Erlinda Hong     HPI:  HPI The patient is a 62 year old woman who is seen status post ORIF trimalleolar ankle fracture on March 14.  She presents today as a work in for concern of rash and discomfort underneath her splint she is in a posterior splint  Ortho Exam On examination of the right ankle the incisions are well-approximated sutures there is no gaping drainage or erythema.    Visit Diagnoses:  1. Closed displaced trimalleolar fracture of right ankle, initial encounter     Plan: Will place in a cam walker.  Begin daily dose of cleansing.  Dry dressings.  Sutures harvested without incident today.  Follow-Up Instructions: Return in about 2 weeks (around 11/13/2022).   Imaging: No results found.  Orders:  Orders Placed This Encounter  Procedures   XR Ankle Complete Right   No orders of the defined types were placed in this encounter.    PMFS History: Patient Active Problem List   Diagnosis Date Noted   Closed displaced trimalleolar fracture of right lower leg 10/16/2022   Essential hypertension 09/05/2015   Hypothyroid 07/02/2012   Anxiety 07/02/2012   BMI 31.0-31.9,adult 07/02/2012   Past Medical History:  Diagnosis Date   Anxiety    GERD (gastroesophageal reflux disease)    HLD (hyperlipidemia)    Hypertension    Hypothyroidism    PONV (postoperative nausea and vomiting)     Family History  Problem Relation Age of Onset   Irritable bowel syndrome Mother    Diabetes Mother    Hyperlipidemia Mother    Liver cancer Mother        unsure of original site   Colon polyps Sister    Breast cancer Maternal Grandmother    Clotting disorder Nephew    Diabetes Nephew    Colon cancer Neg  Hx    Esophageal cancer Neg Hx    Rectal cancer Neg Hx    Stomach cancer Neg Hx     Past Surgical History:  Procedure Laterality Date   CESAREAN SECTION     COLONOSCOPY     more than 10 years ago   ORIF ANKLE FRACTURE Right 10/18/2022   Procedure: OPEN REDUCTION INTERNAL FIXATION (ORIF) RIGHT TRIMALLEOLAR;  Surgeon: Leandrew Koyanagi, MD;  Location: [place];  Service: Orthopedics;  Laterality: Right;   WISDOM TOOTH EXTRACTION     Social History   Occupational History   Occupation: Pharmacist, hospital   Occupation: Brewing technologist  Tobacco Use   Smoking status: Former    Types: Cigarettes    Quit date: 04/25/1985    Years since quitting: 37.5   Smokeless tobacco: Never  Vaping Use   Vaping Use: Never used  Substance and Sexual Activity   Alcohol use: No   Drug use: No   Sexual activity: Never

## 2022-11-13 ENCOUNTER — Other Ambulatory Visit (INDEPENDENT_AMBULATORY_CARE_PROVIDER_SITE_OTHER): Payer: Worker's Compensation

## 2022-11-13 ENCOUNTER — Encounter: Payer: Self-pay | Admitting: Physician Assistant

## 2022-11-13 ENCOUNTER — Ambulatory Visit (INDEPENDENT_AMBULATORY_CARE_PROVIDER_SITE_OTHER): Payer: Worker's Compensation | Admitting: Physician Assistant

## 2022-11-13 DIAGNOSIS — S82851A Displaced trimalleolar fracture of right lower leg, initial encounter for closed fracture: Secondary | ICD-10-CM

## 2022-11-13 NOTE — Progress Notes (Signed)
Post-Op Visit Note   Patient: Christine [name]           Date of Birth: 1961/07/02           MRN: [id number] Visit Date: 11/13/2022 PCP: Shade Flood, MD   Assessment & Plan:  Chief Complaint:  Chief Complaint  Patient presents with   Right Ankle - Follow-up    ORIF right trimalleolar ankle fracture 10/18/2022   Visit Diagnoses:  1. Closed displaced trimalleolar fracture of right ankle, initial encounter     Plan: Patient is a pleasant 62 year old female who comes in today 4 weeks status post ORIF right trimalleolar ankle fracture, date of surgery 10/18/2022.  She has had some discomfort which has been relieved with Tylenol and Advil.  She has been taking a baby aspirin once daily for DVT prophylaxis.  She has been compliant wearing her cam boot nonweightbearing.  When she was seen approximately 2 weeks ago, only the medial sutures were removed.  Examination of her right ankle reveals a well-healed surgical incision with nylon sutures in place.  No evidence of infection or cellulitis.  She does have a nickel sized scab to the medial ankle for which there is no evidence of cellulitis or infection.  She has moderate swelling to the right ankle.  Calf is soft and nontender.  She is neurovascularly intact distally.  At this point, she may begin touchdown weightbearing in her cam boot for the next 2 weeks.  We will order home health PT.  She will follow-up with [place] in 2 weeks for repeat evaluation and three-view x-rays of the right ankle.  At that point, we anticipate allowing her to weight-bear as tolerated in her cam boot and will likely refer her to outpatient physical therapy.  She will continue taking a baby aspirin twice daily for DVT prophylaxis for the next 2 weeks.  Ice and elevate is much as possible.  Call with concerns or questions in the meantime. Follow-Up Instructions: Return in about 2 weeks (around 11/27/2022).   Orders:  Orders Placed This Encounter  Procedures   XR Ankle  Complete Right   No orders of the defined types were placed in this encounter.   Imaging: No results found.  PMFS History: Patient Active Problem List   Diagnosis Date Noted   Closed displaced trimalleolar fracture of right lower leg 10/16/2022   Essential hypertension 09/05/2015   Hypothyroid 07/02/2012   Anxiety 07/02/2012   BMI 31.0-31.9,adult 07/02/2012   Past Medical History:  Diagnosis Date   Anxiety    GERD (gastroesophageal reflux disease)    HLD (hyperlipidemia)    Hypertension    Hypothyroidism    PONV (postoperative nausea and vomiting)     Family History  Problem Relation Age of Onset   Irritable bowel syndrome Mother    Diabetes Mother    Hyperlipidemia Mother    Liver cancer Mother        unsure of original site   Colon polyps Sister    Breast cancer Maternal Grandmother    Clotting disorder Nephew    Diabetes Nephew    Colon cancer Neg Hx    Esophageal cancer Neg Hx    Rectal cancer Neg Hx    Stomach cancer Neg Hx     Past Surgical History:  Procedure Laterality Date   CESAREAN SECTION     COLONOSCOPY     more than 10 years ago   ORIF ANKLE FRACTURE Right 10/18/2022   Procedure: OPEN  REDUCTION INTERNAL FIXATION (ORIF) RIGHT TRIMALLEOLAR;  Surgeon: Tarry Kos, MD;  Location: [facility];  Service: Orthopedics;  Laterality: Right;   WISDOM TOOTH EXTRACTION     Social History   Occupational History   Occupation: Runner, broadcasting/film/video   Occupation: Surveyor, minerals  Tobacco Use   Smoking status: Former    Types: Cigarettes    Quit date: 04/25/1985    Years since quitting: 37.5   Smokeless tobacco: Never  Vaping Use   Vaping Use: Never used  Substance and Sexual Activity   Alcohol use: No   Drug use: No   Sexual activity: Never

## 2022-11-13 NOTE — Addendum Note (Signed)
Addended by: Wendi Maya on: 11/13/2022 01:41 PM   Modules accepted: Orders

## 2022-11-19 ENCOUNTER — Telehealth: Payer: Self-pay

## 2022-11-19 NOTE — Telephone Encounter (Addendum)
I understand, but as it has been about six weeks since her last visit, and 1 month since mychart message I recommend visit (virtual if needed) with any provider to determine if sinus infection and treatment options.Thanks.

## 2022-11-19 NOTE — Telephone Encounter (Signed)
Pt notes she still has sinus infection and notes she did send several MyChart messages in this regard, noted she was hoping you could prescribe her the amoxicillin considering last abx did not work.   Notes foot is broken rn and could potentially do a virtual visit but can't really come in at this time

## 2022-11-20 ENCOUNTER — Encounter: Payer: Self-pay | Admitting: Family Medicine

## 2022-11-20 ENCOUNTER — Telehealth (INDEPENDENT_AMBULATORY_CARE_PROVIDER_SITE_OTHER): Payer: Medicaid Other | Admitting: Family Medicine

## 2022-11-20 VITALS — Ht 66.0 in | Wt 218.0 lb

## 2022-11-20 DIAGNOSIS — J320 Chronic maxillary sinusitis: Secondary | ICD-10-CM

## 2022-11-20 MED ORDER — AZITHROMYCIN 250 MG PO TABS: ORAL_TABLET | ORAL | 0 refills | Status: AC

## 2022-11-20 NOTE — Telephone Encounter (Signed)
Appt for virtual has been made by Sheryle Hail C to see NP Zandra Abts

## 2022-11-20 NOTE — Progress Notes (Signed)
Virtual Visit via Video Note  I connected with [name] on 11/20/22 at 4:16 PM by a video enabled telemedicine application and verified that I am speaking with the correct person using two identifiers.   Patient Location: Home Provider Location: office - [facility].    I discussed the limitations, risks, security and privacy concerns of performing an evaluation and management service by telephone and the availability of in person appointments. I also discussed with the patient that there may be a patient responsible charge related to this service. The patient expressed understanding and agreed to proceed, consent obtained  Chief Complaint  Patient presents with   Sinus Problem    Pt reports she has sinus infection since 08/2022. Pt reports she was given antibiotics in Jan. And she was nausea, then was given another antibiotic and this helped. Pt reports she can "smell" this. Pressure on her face .       History of Present Illness: Christine [name] is a 62 y.o. female that presents with right maxillary tenderness and nasal congestion with post nasal drip. Symptoms started 2 weeks ago.   Denies sore throat, fever, cough, chest pain, shortness of breath, headache, ear pain or drainage, dizziness, and lightheadedness.  She took Dimetapp and Advil with little relief.   Denies doing a covid home test.  No known exposure to anyone who has been sick since Easter.   Patient Active Problem List   Diagnosis Date Noted   Closed displaced trimalleolar fracture of right lower leg 10/16/2022   Essential hypertension 09/05/2015   Hypothyroid 07/02/2012   Anxiety 07/02/2012   BMI 31.0-31.9,adult 07/02/2012   Past Medical History:  Diagnosis Date   Anxiety    GERD (gastroesophageal reflux disease)    HLD (hyperlipidemia)    Hypertension    Hypothyroidism    PONV (postoperative nausea and vomiting)    Past Surgical History:  Procedure Laterality Date   CESAREAN SECTION      COLONOSCOPY     more than 10 years ago   ORIF ANKLE FRACTURE Right 10/18/2022   Procedure: OPEN REDUCTION INTERNAL FIXATION (ORIF) RIGHT TRIMALLEOLAR;  Surgeon: Tarry Kos, MD;  Location: [facility];  Service: Orthopedics;  Laterality: Right;   WISDOM TOOTH EXTRACTION     Allergies  Allergen Reactions   Codeine Nausea Only   Prior to Admission medications   Medication Sig Start Date End Date Taking? Authorizing Provider  Acetaminophen (TYLENOL 8 HOUR PO) Take 1,000 mg by mouth once.   Yes [provider]  FLUoxetine (PROZAC) 10 MG tablet Take 1 tablet (10 mg total) by mouth daily. 10/04/22  Yes Shade Flood, MD  hydrochlorothiazide (MICROZIDE) 12.5 MG capsule Take 1 capsule (12.5 mg total) by mouth daily. 10/04/22  Yes Shade Flood, MD  levothyroxine (SYNTHROID) 137 MCG tablet Take 1 tablet (137 mcg total) by mouth daily. 10/04/22  Yes Shade Flood, MD  omeprazole (PRILOSEC) 40 MG capsule TAKE 1 CAPSULE (40 MG TOTAL) BY MOUTH DAILY. 09/03/22  Yes Shade Flood, MD  ondansetron (ZOFRAN) 4 MG tablet Take 1 tablet (4 mg total) by mouth every 8 (eight) hours as needed for nausea or vomiting. 10/17/22  Yes Cristie Hem, PA-C  ALPRAZolam Prudy Feeler) 0.5 MG tablet Take 0.5-1 tablets (0.25-0.5 mg total) by mouth 2 (two) times daily as needed for anxiety (or panic attack. do not combine with narcotic pain medication.). Patient not taking: Reported on 11/20/2022 10/17/22   Shade Flood,  MD  doxycycline (VIBRA-TABS) 100 MG tablet Take 1 tablet (100 mg total) by mouth 2 (two) times daily. Patient not taking: Reported on 11/20/2022 10/04/22   Shade Flood, MD  HYDROcodone-acetaminophen [facility]) 5-325 MG tablet Take 1 tablet by mouth every 6 (six) hours as needed. Patient not taking: Reported on 11/20/2022 10/17/22   Cristie Hem, PA-C  PARoxetine (PAXIL) 10 MG tablet Take by mouth. Patient not taking: Reported on 11/20/2022 12/23/20   [provider]    Social History   Socioeconomic History   Marital status: Widowed    Spouse name: Not on file   Number of children: 2   Years of education: Not on file   Highest education level: Not on file  Occupational History   Occupation: Runner, broadcasting/film/video   Occupation: Surveyor, minerals  Tobacco Use   Smoking status: Former    Types: Cigarettes    Quit date: 04/25/1985    Years since quitting: 37.5   Smokeless tobacco: Never  Vaping Use   Vaping Use: Never used  Substance and Sexual Activity   Alcohol use: No   Drug use: No   Sexual activity: Never  Other Topics Concern   Not on file  Social History Narrative   Not on file   Social Determinants of Health   Financial Resource Strain: Not on file  Food Insecurity: Not on file  Transportation Needs: Not on file  Physical Activity: Not on file  Stress: Not on file  Social Connections: Not on file  Intimate Partner Violence: Not on file    Observations/Objective: Today's Vitals   11/20/22 1432  Weight: 218 lb (98.9 kg)  Height:  (1.676 m)   Physical Exam Vitals reviewed.  Constitutional:      General: She is not in acute distress.    Appearance: Normal appearance. She is not ill-appearing, toxic-appearing or diaphoretic.  Eyes:     General:        Right eye: No discharge.        Left eye: No discharge.     Conjunctiva/sclera: Conjunctivae normal.     Comments: Wearing glassess  Pulmonary:     Effort: Pulmonary effort is normal. No respiratory distress.  Skin:    General: Skin is dry.  Neurological:     General: No focal deficit present.     Mental Status: She is alert and oriented to person, place, and time. Mental status is at baseline.  Psychiatric:        Mood and Affect: Mood normal.        Behavior: Behavior normal.        Thought Content: Thought content normal.        Judgment: Judgment normal.    Assessment and Plan: Right maxillary sinusitis -     Azithromycin; Take 2 tablets on day 1, then 1 tablet daily on  days 2 through 5  Dispense: 6 tablet; Refill: 0  -Prescribed Azithromycin 5 day taper pack. If not improved with taking full course of antibiotic, recommend she follow up. May need a ENT referral for chronic sinusitis.  -Recommend to stay hydrated, 64-100oz of fluid a day. -Use a cool mist humidifier.  -Take a warm shower and breathe in steam.  -Rest -Take Tylenol or Ibuprofen for pain.  -Can use nasal saline washes.   Follow Up Instructions: If not improved.    I discussed the assessment and treatment plan with the patient. The patient was provided an opportunity to ask questions and  all were answered. The patient agreed with the plan and demonstrated an understanding of the instructions.   The patient was advised to call back or seek an in-person evaluation if the symptoms worsen or if the condition fails to improve as anticipated.   Zandra Abts, NP

## 2022-11-29 ENCOUNTER — Other Ambulatory Visit (INDEPENDENT_AMBULATORY_CARE_PROVIDER_SITE_OTHER): Payer: Worker's Compensation

## 2022-11-29 ENCOUNTER — Ambulatory Visit (INDEPENDENT_AMBULATORY_CARE_PROVIDER_SITE_OTHER): Payer: Worker's Compensation | Admitting: Physician Assistant

## 2022-11-29 ENCOUNTER — Encounter: Payer: Self-pay | Admitting: Physician Assistant

## 2022-11-29 DIAGNOSIS — S82851A Displaced trimalleolar fracture of right lower leg, initial encounter for closed fracture: Secondary | ICD-10-CM | POA: Diagnosis not present

## 2022-11-29 NOTE — Progress Notes (Signed)
Post-Op Visit Note   Patient: Christine [name]           Date of Birth: Oct 20, 1960           MRN: [id number] Visit Date: 11/29/2022 PCP: Shade Flood, MD   Assessment & Plan:  Chief Complaint:  Chief Complaint  Patient presents with   Right Ankle - Follow-up    ORIF Right trimalleolar 10/18/2022   Visit Diagnoses:  1. Closed displaced trimalleolar fracture of right ankle, initial encounter     Plan: Patient is a pleasant 62 year old female who comes in today approximately 6 weeks status post ORIF right trimalleolar ankle fracture, date of surgery 10/18/2022.  She has been getting home health physical therapy and has been compliant touchdown weightbearing in a cam boot.  Minimal pain for which she occasionally takes Advil or Tylenol.  She has been compliant taking baby aspirin twice daily for DVT prophylaxis until recently.  She notes that the scab to the medial ankle wound has fallen off and she has been cleaning this with soap and applying dry dressings.  She denies any fevers or chills or any other constitutional symptoms.  In regards to her left ankle sprain, she is doing well.  She is wearing her cam boot most of the time.  She has been getting home health physical therapy for the left ankle as well.  Examination of the right ankle reveals a nickel sized wound.  Minimal erythema.  No warmth or tenderness.  Slight serous drainage.  Lateral incision shows a small area that appears to have superficially dehisced and started to re-heel.  No current evidence of infection or cellulitis.  Feet are warm and well-perfused.  She is neurovascularly intact distally.  At this point, we have applied wet-to-dry dressings to the medial wound which she will do twice a day until this is healed.  She may bear weight in the cam boot.  She will elevate for pain and swelling with foot in a DME order for knee-high compression socks.  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   Patient will remain out of work for another 6 weeks and a work note was provided today.  She will follow-up with [place] in 6 weeks for repeat evaluation and three-view x-rays of the right ankle.  Call with concerns or questions in the meantime.  Follow-Up Instructions: Return in about 6 weeks (around 01/10/2023).   Orders:  Orders Placed This Encounter  Procedures   XR Ankle Complete Right   No orders of the defined types were placed in this encounter.   Imaging: XR Ankle Complete Right  Result Date: 11/29/2022 X-rays demonstrate consolidation of the fracture sites without hardware complication   PMFS History: Patient Active Problem List   Diagnosis Date Noted   Closed displaced trimalleolar fracture of right lower leg 10/16/2022   Essential hypertension 09/05/2015   Hypothyroid 07/02/2012   Anxiety 07/02/2012   BMI 31.0-31.9,adult 07/02/2012   Past Medical History:  Diagnosis Date   Anxiety    GERD (gastroesophageal reflux disease)    HLD (hyperlipidemia)    Hypertension    Hypothyroidism    PONV (postoperative nausea and vomiting)     Family History  Problem Relation Age of Onset   Irritable bowel syndrome Mother    Diabetes Mother    Hyperlipidemia Mother    Liver cancer Mother        unsure of original site   Colon  polyps Sister    Breast cancer Maternal Grandmother    Clotting disorder Nephew    Diabetes Nephew    Colon cancer Neg Hx    Esophageal cancer Neg Hx    Rectal cancer Neg Hx    Stomach cancer Neg Hx     Past Surgical History:  Procedure Laterality Date   CESAREAN SECTION     COLONOSCOPY     more than 10 years ago   ORIF ANKLE FRACTURE Right 10/18/2022   Procedure: OPEN REDUCTION INTERNAL FIXATION (ORIF) RIGHT TRIMALLEOLAR;  Surgeon: Tarry Kos, MD;  Location: [facility];  Service: Orthopedics;  Laterality: Right;   WISDOM TOOTH EXTRACTION     Social History   Occupational History   Occupation: Runner, broadcasting/film/video   Occupation: Proofreader  Tobacco Use   Smoking status: Former    Types: Cigarettes    Quit date: 04/25/1985    Years since quitting: 37.6   Smokeless tobacco: Never  Vaping Use   Vaping Use: Never used  Substance and Sexual Activity   Alcohol use: No   Drug use: No   Sexual activity: Never

## 2022-12-03 ENCOUNTER — Telehealth: Payer: Self-pay | Admitting: Orthopaedic Surgery

## 2022-12-03 NOTE — Telephone Encounter (Signed)
P/T [facility] urgent [facility] is asking for last office notes--(Liz) P/T department --[phone]

## 2022-12-05 DIAGNOSIS — Z419 Encounter for procedure for purposes other than remedying health state, unspecified: Secondary | ICD-10-CM | POA: Diagnosis not present

## 2022-12-13 ENCOUNTER — Telehealth: Payer: Self-pay | Admitting: Orthopaedic Surgery

## 2022-12-13 ENCOUNTER — Ambulatory Visit (INDEPENDENT_AMBULATORY_CARE_PROVIDER_SITE_OTHER): Payer: Worker's Compensation | Admitting: Physician Assistant

## 2022-12-13 ENCOUNTER — Encounter: Payer: Self-pay | Admitting: Physician Assistant

## 2022-12-13 DIAGNOSIS — S82851D Displaced trimalleolar fracture of right lower leg, subsequent encounter for closed fracture with routine healing: Secondary | ICD-10-CM

## 2022-12-13 MED ORDER — DOXYCYCLINE HYCLATE 100 MG PO TABS: 100.0000 mg | ORAL_TABLET | Freq: Two times a day (BID) | ORAL | 0 refills | Status: DC

## 2022-12-13 NOTE — Telephone Encounter (Signed)
Patient called in stating The incision site on her heel is slow to heal in one spot and she is having soreness when walking and it was a yellow puss in it and it was a blister on the side gone as of this morning and now its a blood blister and doing some bleeding and it is oozing clear puss now please advise

## 2022-12-13 NOTE — Progress Notes (Signed)
Post-Op Visit Note   Patient: Christine [name]           Date of Birth: 06/25/1961           MRN: [id number] Visit Date: 12/13/2022 PCP: Shade Flood, MD   Assessment & Plan:  Chief Complaint:  Chief Complaint  Patient presents with   Right Ankle - Follow-up    ORIF trimall 10/18/2022   Visit Diagnoses:  1. Closed displaced trimalleolar fracture of right ankle with routine healing, subsequent encounter     Plan: Patient is a pleasant 62 year old female, who comes in today for follow-up of ORIF right trimalleolar ankle fracture date of surgery 10/18/2022.  She is now approximately 8 weeks out from surgery.  She has been weightbearing in a cam boot with her ankle wrapped in an Ace bandage over the past 2 weeks.  When she was seen in our office about 2 weeks ago, she had a small area to the lateral incision that appeared to have slightly dehisced but had started to heal over.  She notes that over the past 1 to 2 days, this area opened back up and she noticed drainage yesterday.  She denies any increased pain.  She does have increased swelling to the right ankle but has been on her feet more than usual.  She is not a diabetic.  She is not currently taking antibiotics.  Will put in a DME order for compression socks approximately 2 weeks ago since this is filed under work comp.  Patient has yet to be contacted for this.  Examination of the right ankle reveals a small area of superficial dehiscence to the distal aspect.  I am unable to express any discharge.  There is no erythema or evidence of cellulitis.  She does have swelling to the right ankle.  The medial wound is healing without any signs of infection.  At this point, would like to start her on doxycycline.  Will have her apply Neosporin and a Band-Aid daily to both the medial and lateral ankle wounds.  She will ice and elevate for pain and swelling.  We have sent tissue and new message in regards to the previous DME prescription for  compression socks.  Continue with PT.  Continue weightbearing in a cam boot.  Follow-up with [place] at her regularly scheduled appointment in 4 weeks.  She will call with any concerns or questions in the meantime.  Follow-Up Instructions: Return in about 4 weeks (around 01/10/2023).   Orders:  No orders of the defined types were placed in this encounter.  No orders of the defined types were placed in this encounter.   Imaging: No results found.  PMFS History: Patient Active Problem List   Diagnosis Date Noted   Closed displaced trimalleolar fracture of right lower leg 10/16/2022   Essential hypertension 09/05/2015   Hypothyroid 07/02/2012   Anxiety 07/02/2012   BMI 31.0-31.9,adult 07/02/2012   Past Medical History:  Diagnosis Date   Anxiety    GERD (gastroesophageal reflux disease)    HLD (hyperlipidemia)    Hypertension    Hypothyroidism    PONV (postoperative nausea and vomiting)     Family History  Problem Relation Age of Onset   Irritable bowel syndrome Mother    Diabetes Mother    Hyperlipidemia Mother    Liver cancer Mother        unsure of original site   Colon polyps Sister    Breast cancer Maternal Grandmother  Clotting disorder Nephew    Diabetes Nephew    Colon cancer Neg Hx    Esophageal cancer Neg Hx    Rectal cancer Neg Hx    Stomach cancer Neg Hx     Past Surgical History:  Procedure Laterality Date   CESAREAN SECTION     COLONOSCOPY     more than 10 years ago   ORIF ANKLE FRACTURE Right 10/18/2022   Procedure: OPEN REDUCTION INTERNAL FIXATION (ORIF) RIGHT TRIMALLEOLAR;  Surgeon: Tarry Kos, MD;  Location: [facility];  Service: Orthopedics;  Laterality: Right;   WISDOM TOOTH EXTRACTION     Social History   Occupational History   Occupation: Runner, broadcasting/film/video   Occupation: Surveyor, minerals  Tobacco Use   Smoking status: Former    Types: Cigarettes    Quit date: 04/25/1985    Years since quitting: 37.6   Smokeless tobacco: Never  Vaping  Use   Vaping Use: Never used  Substance and Sexual Activity   Alcohol use: No   Drug use: No   Sexual activity: Never

## 2022-12-13 NOTE — Telephone Encounter (Signed)
sure

## 2022-12-14 DIAGNOSIS — S82851A Displaced trimalleolar fracture of right lower leg, initial encounter for closed fracture: Secondary | ICD-10-CM

## 2022-12-28 ENCOUNTER — Encounter: Payer: Self-pay | Admitting: Orthopaedic Surgery

## 2022-12-28 ENCOUNTER — Ambulatory Visit (INDEPENDENT_AMBULATORY_CARE_PROVIDER_SITE_OTHER): Payer: Worker's Compensation | Admitting: Orthopaedic Surgery

## 2022-12-28 DIAGNOSIS — S82851D Displaced trimalleolar fracture of right lower leg, subsequent encounter for closed fracture with routine healing: Secondary | ICD-10-CM

## 2022-12-28 NOTE — Progress Notes (Signed)
   Post-Op Visit Note   Patient: Christine [name]           Date of Birth: 08/16/1960           MRN: [id number] Visit Date: 12/28/2022 PCP: Shade Flood, MD   Assessment & Plan:  Chief Complaint:  Chief Complaint  Patient presents with   Right Ankle - Follow-up    ORIF right ankle 10/18/2022   Visit Diagnoses:  1. Closed displaced trimalleolar fracture of right ankle with routine healing, subsequent encounter     Plan: Patient is here for recheck on ankle wounds.  Examination right ankle shows small area on the lateral side with a bed of the fibrinous exudative tissue.  No signs of infection or drainage.  The anterior medial skin is healing very well.  Reassurance was provided that there is no evidence of infection.  We will order Santyl ointment for the lateral wound.  Follow-up as scheduled.  Follow-Up Instructions: No follow-ups on file.   Orders:  No orders of the defined types were placed in this encounter.  No orders of the defined types were placed in this encounter.   Imaging: No results found.  PMFS History: Patient Active Problem List   Diagnosis Date Noted   Displaced trimalleolar fracture of right lower leg, initial encounter for closed fracture 12/14/2022   Closed displaced trimalleolar fracture of right lower leg 10/16/2022   Essential hypertension 09/05/2015   Hypothyroid 07/02/2012   Anxiety 07/02/2012   BMI 31.0-31.9,adult 07/02/2012   Past Medical History:  Diagnosis Date   Anxiety    GERD (gastroesophageal reflux disease)    HLD (hyperlipidemia)    Hypertension    Hypothyroidism    PONV (postoperative nausea and vomiting)     Family History  Problem Relation Age of Onset   Irritable bowel syndrome Mother    Diabetes Mother    Hyperlipidemia Mother    Liver cancer Mother        unsure of original site   Colon polyps Sister    Breast cancer Maternal Grandmother    Clotting disorder Nephew    Diabetes Nephew    Colon cancer Neg Hx     Esophageal cancer Neg Hx    Rectal cancer Neg Hx    Stomach cancer Neg Hx     Past Surgical History:  Procedure Laterality Date   CESAREAN SECTION     COLONOSCOPY     more than 10 years ago   ORIF ANKLE FRACTURE Right 10/18/2022   Procedure: OPEN REDUCTION INTERNAL FIXATION (ORIF) RIGHT TRIMALLEOLAR;  Surgeon: Tarry Kos, MD;  Location: [facility];  Service: Orthopedics;  Laterality: Right;   WISDOM TOOTH EXTRACTION     Social History   Occupational History   Occupation: Runner, broadcasting/film/video   Occupation: Surveyor, minerals  Tobacco Use   Smoking status: Former    Types: Cigarettes    Quit date: 04/25/1985    Years since quitting: 37.7   Smokeless tobacco: Never  Vaping Use   Vaping Use: Never used  Substance and Sexual Activity   Alcohol use: No   Drug use: No   Sexual activity: Never

## 2023-01-01 ENCOUNTER — Other Ambulatory Visit: Payer: Self-pay | Admitting: Family Medicine

## 2023-01-01 DIAGNOSIS — E039 Hypothyroidism, unspecified: Secondary | ICD-10-CM

## 2023-01-05 DIAGNOSIS — Z419 Encounter for procedure for purposes other than remedying health state, unspecified: Secondary | ICD-10-CM | POA: Diagnosis not present

## 2023-01-08 ENCOUNTER — Ambulatory Visit (INDEPENDENT_AMBULATORY_CARE_PROVIDER_SITE_OTHER): Payer: Worker's Compensation | Admitting: Physician Assistant

## 2023-01-08 ENCOUNTER — Other Ambulatory Visit (INDEPENDENT_AMBULATORY_CARE_PROVIDER_SITE_OTHER): Payer: Worker's Compensation

## 2023-01-08 ENCOUNTER — Encounter: Payer: Self-pay | Admitting: Physician Assistant

## 2023-01-08 DIAGNOSIS — S82851D Displaced trimalleolar fracture of right lower leg, subsequent encounter for closed fracture with routine healing: Secondary | ICD-10-CM

## 2023-01-08 NOTE — Progress Notes (Signed)
Post-Op Visit Note   Patient: Christine [name]           Date of Birth: [birth year]           MRN: [id number] Visit Date: 01/08/2023 PCP: Shade Flood, MD   Assessment & Plan:  Chief Complaint:  Chief Complaint  Patient presents with   Right Ankle - Follow-up    Right ankle ORIF 10/18/2022   Visit Diagnoses:  1. Closed displaced trimalleolar fracture of right ankle with routine healing, subsequent encounter     Plan: Patient is a pleasant 62 year old female who comes in today 12 weeks status post ORIF right trimalleolar ankle fracture, date of surgery 10/18/2022.  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  She has been doing well.  She is ambulating in a regular shoe without her ASO brace.  She is in physical therapy making good progress.  She feels as though the 2 wounds to the ankle have somewhat scabbed over so she has not been applying Santyl.  She does continue to put Band-Aids over these wounds and has continued to notice slight green discharge to the lateral ankle wound.  Examination of the right ankle reveals scabs to both the medial and lateral ankle wounds.  At this point, I feel that she may need to start applying the Santyl to the lateral wound as she is finding slight drainage to the Band-Aid.  She understands and agrees.  She will continue with physical therapy.  I recommended wearing an ASO brace with her shoe.  Will provide a new work note to be out for another 5 weeks.  Follow-up in 4 weeks for repeat evaluation and three-view x-rays of the right ankle.  Call with concerns or questions in the meantime.  Follow-Up Instructions: Return in about 4 weeks (around 02/05/2023).   Orders:  Orders Placed This Encounter  Procedures   XR Ankle Complete Right   No orders of the defined types were placed in this encounter.   Imaging: XR Ankle Complete Right  Result Date: 01/08/2023 X-rays demonstrate consolidation of the fracture sites   PMFS History: Patient Active Problem List    Diagnosis Date Noted   Displaced trimalleolar fracture of right lower leg, initial encounter for closed fracture 12/14/2022   Closed displaced trimalleolar fracture of right lower leg 10/16/2022   Essential hypertension 09/05/2015   Hypothyroid 07/02/2012   Anxiety 07/02/2012   BMI 31.0-31.9,adult 07/02/2012   Past Medical History:  Diagnosis Date   Anxiety    GERD (gastroesophageal reflux disease)    HLD (hyperlipidemia)    Hypertension    Hypothyroidism    PONV (postoperative nausea and vomiting)     Family History  Problem Relation Age of Onset   Irritable bowel syndrome Mother    Diabetes Mother    Hyperlipidemia Mother    Liver cancer Mother        unsure of original site   Colon polyps Sister    Breast cancer Maternal Grandmother    Clotting disorder Nephew    Diabetes Nephew    Colon cancer Neg Hx    Esophageal cancer Neg Hx    Rectal cancer Neg Hx    Stomach cancer Neg Hx     Past Surgical History:  Procedure Laterality Date   CESAREAN SECTION     COLONOSCOPY     more than 10 years ago   ORIF ANKLE FRACTURE Right 10/18/2022   Procedure: OPEN REDUCTION INTERNAL FIXATION (ORIF) RIGHT TRIMALLEOLAR;  Surgeon: Roda Shutters,  Edwin Cap, MD;  Location: [facility];  Service: Orthopedics;  Laterality: Right;   WISDOM TOOTH EXTRACTION     Social History   Occupational History   Occupation: Runner, broadcasting/film/video   Occupation: Surveyor, minerals  Tobacco Use   Smoking status: Former    Types: Cigarettes    Quit date: 04/25/1985    Years since quitting: 37.7   Smokeless tobacco: Never  Vaping Use   Vaping Use: Never used  Substance and Sexual Activity   Alcohol use: No   Drug use: No   Sexual activity: Never

## 2023-01-11 ENCOUNTER — Other Ambulatory Visit (INDEPENDENT_AMBULATORY_CARE_PROVIDER_SITE_OTHER): Payer: Medicaid Other

## 2023-01-11 DIAGNOSIS — E039 Hypothyroidism, unspecified: Secondary | ICD-10-CM

## 2023-01-11 LAB — TSH: TSH: 0.13 u[IU]/mL — ABNORMAL LOW (ref 0.35–5.50)

## 2023-01-16 ENCOUNTER — Other Ambulatory Visit: Payer: Self-pay | Admitting: Family Medicine

## 2023-01-16 ENCOUNTER — Telehealth: Payer: Self-pay | Admitting: Family Medicine

## 2023-01-16 DIAGNOSIS — E039 Hypothyroidism, unspecified: Secondary | ICD-10-CM

## 2023-01-16 MED ORDER — LEVOTHYROXINE SODIUM 125 MCG PO TABS: 125.0000 ug | ORAL_TABLET | Freq: Every day | ORAL | 1 refills | Status: DC

## 2023-01-16 NOTE — Telephone Encounter (Signed)
Caller name: BETSEY SOSSAMON  On DPR?: Yes  Call back number: [phone] (mobile)  Provider they see: Shade Flood, MD  Reason for call:   Pt would like a call about lab results from last week.

## 2023-01-16 NOTE — Telephone Encounter (Signed)
Pt is calling and asking for her lab results from 01/11/23. I explained to the pt  that I would send a message to DR Neva Seat to review

## 2023-01-16 NOTE — Progress Notes (Signed)
See labs 

## 2023-01-16 NOTE — Telephone Encounter (Signed)
Results note sent and med changed.

## 2023-01-17 NOTE — Telephone Encounter (Signed)
Patient viewed results via my chart

## 2023-02-04 DIAGNOSIS — Z419 Encounter for procedure for purposes other than remedying health state, unspecified: Secondary | ICD-10-CM | POA: Diagnosis not present

## 2023-02-12 ENCOUNTER — Ambulatory Visit (INDEPENDENT_AMBULATORY_CARE_PROVIDER_SITE_OTHER): Payer: Worker's Compensation | Admitting: Physician Assistant

## 2023-02-12 ENCOUNTER — Encounter: Payer: Self-pay | Admitting: Physician Assistant

## 2023-02-12 ENCOUNTER — Other Ambulatory Visit (INDEPENDENT_AMBULATORY_CARE_PROVIDER_SITE_OTHER): Payer: Worker's Compensation

## 2023-02-12 DIAGNOSIS — S82851D Displaced trimalleolar fracture of right lower leg, subsequent encounter for closed fracture with routine healing: Secondary | ICD-10-CM

## 2023-02-12 NOTE — Progress Notes (Signed)
Post-Op Visit Note   Patient: Christine [name]           Date of Birth: [birth year]           MRN: [id number] Visit Date: 02/12/2023 PCP: Shade Flood, MD   Assessment & Plan:  Chief Complaint:  Chief Complaint  Patient presents with   Right Ankle - Follow-up    ORIF right ankle 10/18/2022   Visit Diagnoses:  1. Closed displaced trimalleolar fracture of right ankle with routine healing, subsequent encounter     Plan: Patient is a pleasant 62 year old female who comes in today approximately 4 months status post ORIF right trimalleolar ankle fracture, date of surgery 10/18/2022.  Of note, this has been filed under work comp.  She has been in minimal discomfort and takes occasional Tylenol.  She has been in outpatient physical therapy where she feels as though she is making progress but still notes stiffness.  She tells me that the physical therapist thinks she needs more therapy.  She has not been wearing an ASO brace.  She has not noticed any drainage to the lateral ankle wound and therefore has not been using Santyl.  Examination of her right ankle reveals a fully healed medial ankle surgical scar.  The lateral scar still has a scab to the proximal to mid aspect.  No drainage or signs of infection or cellulitis.  No tenderness to the fracture site.  Calves are soft and nontender.  She can dorsiflex to neutral.  Otherwise decent range of motion with plantarflexion, inversion and eversion.  She is neurovascularly intact distally.  At this point, I believe her fracture is healed.  I believe she needs more physical therapy for range of motion, specifically with dorsiflexion.  We have extended this for another 6 weeks.  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  Follow-up in 6 weeks for recheck.  Call with concerns or questions.  Follow-Up Instructions: Return in about 6 weeks  (around 03/26/2023).   Orders:  Orders Placed This Encounter  Procedures   XR Ankle Complete Right   Ambulatory referral to Physical Therapy   No orders of the defined types were placed in this encounter.   Imaging: XR Ankle Complete Right  Result Date: 02/12/2023 X-rays demonstrate complete consolidation of the fracture sites without hardware complication.   PMFS History: Patient Active Problem List   Diagnosis Date Noted   Displaced trimalleolar fracture of right lower leg, initial encounter for closed fracture 12/14/2022   Closed displaced trimalleolar fracture of right lower leg 10/16/2022   Essential hypertension 09/05/2015   Hypothyroid 07/02/2012   Anxiety 07/02/2012   BMI 31.0-31.9,adult 07/02/2012   Past Medical History:  Diagnosis Date   Anxiety    GERD (gastroesophageal reflux disease)    HLD (hyperlipidemia)    Hypertension    Hypothyroidism    PONV (postoperative nausea and vomiting)     Family History  Problem Relation Age of Onset   Irritable bowel syndrome Mother    Diabetes Mother    Hyperlipidemia Mother    Liver cancer Mother        unsure of original site   Colon polyps Sister    Breast cancer Maternal Grandmother    Clotting disorder Nephew    Diabetes Nephew    Colon cancer Neg Hx    Esophageal cancer Neg Hx  Rectal cancer Neg Hx    Stomach cancer Neg Hx     Past Surgical History:  Procedure Laterality Date   CESAREAN SECTION     COLONOSCOPY     more than 10 years ago   ORIF ANKLE FRACTURE Right 10/18/2022   Procedure: OPEN REDUCTION INTERNAL FIXATION (ORIF) RIGHT TRIMALLEOLAR;  Surgeon: Tarry Kos, MD;  Location: [facility];  Service: Orthopedics;  Laterality: Right;   WISDOM TOOTH EXTRACTION     Social History   Occupational History   Occupation: Runner, broadcasting/film/video   Occupation: Surveyor, minerals  Tobacco Use   Smoking status: Former    Types: Cigarettes    Quit date: 04/25/1985    Years since quitting: 37.8   Smokeless  tobacco: Never  Vaping Use   Vaping Use: Never used  Substance and Sexual Activity   Alcohol use: No   Drug use: No   Sexual activity: Never

## 2023-03-06 ENCOUNTER — Encounter (INDEPENDENT_AMBULATORY_CARE_PROVIDER_SITE_OTHER): Payer: Self-pay

## 2023-03-07 DIAGNOSIS — Z419 Encounter for procedure for purposes other than remedying health state, unspecified: Secondary | ICD-10-CM | POA: Diagnosis not present

## 2023-03-26 ENCOUNTER — Encounter: Payer: Self-pay | Admitting: Orthopaedic Surgery

## 2023-03-26 ENCOUNTER — Ambulatory Visit (INDEPENDENT_AMBULATORY_CARE_PROVIDER_SITE_OTHER): Payer: Worker's Compensation | Admitting: Orthopaedic Surgery

## 2023-03-26 DIAGNOSIS — S82851D Displaced trimalleolar fracture of right lower leg, subsequent encounter for closed fracture with routine healing: Secondary | ICD-10-CM

## 2023-03-26 NOTE — Progress Notes (Signed)
Office Visit Note   Patient: Christine [name]           Date of Birth: 1961-01-23           MRN: [id number] Visit Date: 03/26/2023              Requested by: Shade Flood, MD [address] [place],  [place] [address] PCP: Shade Flood, MD   Assessment & Plan: Visit Diagnoses:  1. Closed displaced trimalleolar fracture of right ankle with routine healing, subsequent encounter     Plan: [name] is a 62 year old female who is approximately 5 months postop from right trimalar ankle fracture.  At this point I will order work conditioning for a month.  Out of work for another month.  Will determine if patient needs FCE or not at that time.  Total face to face encounter time was greater than 25 minutes and over half of this time was spent in counseling and/or coordination of care.  Follow-Up Instructions: Return in about 1 month (around 04/26/2023).   Orders:  No orders of the defined types were placed in this encounter.  No orders of the defined types were placed in this encounter.     Procedures: No procedures performed   Clinical Data: No additional findings.   Subjective: Chief Complaint  Patient presents with   Right Ankle - Follow-up    HPI Christine [name] returns today for follow-up.  She is 5 months postop.  She finished physical therapy.  Overall she feels improvement but lacks strength and stamina to the right ankle. Review of Systems  Constitutional: Negative.   HENT: Negative.    Eyes: Negative.   Respiratory: Negative.    Cardiovascular: Negative.   Endocrine: Negative.   Musculoskeletal: Negative.   Neurological: Negative.   Hematological: Negative.   Psychiatric/Behavioral: Negative.    All other systems reviewed and are negative.    Objective: Vital Signs: LMP 08/21/2015 (Approximate)   Physical Exam Vitals and nursing note reviewed.  Constitutional:      Appearance: She is well-developed.  HENT:     Head: Normocephalic and atraumatic.   Pulmonary:     Effort: Pulmonary effort is normal.  Abdominal:     Palpations: Abdomen is soft.  Musculoskeletal:     Cervical back: Neck supple.  Skin:    General: Skin is warm.     Capillary Refill: Capillary refill takes less than 2 seconds.  Neurological:     Mental Status: She is alert and oriented to person, place, and time.  Psychiatric:        Behavior: Behavior normal.        Thought Content: Thought content normal.        Judgment: Judgment normal.     Ortho Exam Examined patient of the right ankle shows fully healed surgical scars.  She has about 3 degrees of dorsiflexion and near normal plantarflexion and inversion eversion. Specialty Comments:  No specialty comments available.  Imaging: No results found.   PMFS History: Patient Active Problem List   Diagnosis Date Noted   Displaced trimalleolar fracture of right lower leg, initial encounter for closed fracture 12/14/2022   Closed displaced trimalleolar fracture of right lower leg 10/16/2022   Essential hypertension 09/05/2015   Hypothyroid 07/02/2012   Anxiety 07/02/2012   BMI 31.0-31.9,adult 07/02/2012   Past Medical History:  Diagnosis Date   Anxiety    GERD (gastroesophageal reflux disease)    HLD (hyperlipidemia)    Hypertension  Hypothyroidism    PONV (postoperative nausea and vomiting)     Family History  Problem Relation Age of Onset   Irritable bowel syndrome Mother    Diabetes Mother    Hyperlipidemia Mother    Liver cancer Mother        unsure of original site   Colon polyps Sister    Breast cancer Maternal Grandmother    Clotting disorder Nephew    Diabetes Nephew    Colon cancer Neg Hx    Esophageal cancer Neg Hx    Rectal cancer Neg Hx    Stomach cancer Neg Hx     Past Surgical History:  Procedure Laterality Date   CESAREAN SECTION     COLONOSCOPY     more than 10 years ago   ORIF ANKLE FRACTURE Right 10/18/2022   Procedure: OPEN REDUCTION INTERNAL FIXATION (ORIF) RIGHT  TRIMALLEOLAR;  Surgeon: Tarry Kos, MD;  Location: [facility];  Service: Orthopedics;  Laterality: Right;   WISDOM TOOTH EXTRACTION     Social History   Occupational History   Occupation: Runner, broadcasting/film/video   Occupation: Surveyor, minerals  Tobacco Use   Smoking status: Former    Current packs/day: 0.00    Types: Cigarettes    Quit date: 04/25/1985    Years since quitting: 37.9   Smokeless tobacco: Never  Vaping Use   Vaping status: Never Used  Substance and Sexual Activity   Alcohol use: No   Drug use: No   Sexual activity: Never

## 2023-04-04 ENCOUNTER — Ambulatory Visit (INDEPENDENT_AMBULATORY_CARE_PROVIDER_SITE_OTHER): Payer: Medicaid Other | Admitting: Family Medicine

## 2023-04-04 VITALS — BP 124/68 | HR 82 | Temp 98.4°F | Ht 66.0 in | Wt 220.2 lb

## 2023-04-04 DIAGNOSIS — Z23 Encounter for immunization: Secondary | ICD-10-CM | POA: Diagnosis not present

## 2023-04-04 DIAGNOSIS — K047 Periapical abscess without sinus: Secondary | ICD-10-CM | POA: Diagnosis not present

## 2023-04-04 DIAGNOSIS — R7303 Prediabetes: Secondary | ICD-10-CM | POA: Diagnosis not present

## 2023-04-04 DIAGNOSIS — E039 Hypothyroidism, unspecified: Secondary | ICD-10-CM

## 2023-04-04 DIAGNOSIS — E785 Hyperlipidemia, unspecified: Secondary | ICD-10-CM | POA: Diagnosis not present

## 2023-04-04 MED ORDER — AMOXICILLIN-POT CLAVULANATE 875-125 MG PO TABS
1.0000 | ORAL_TABLET | Freq: Two times a day (BID) | ORAL | 0 refills | Status: DC
Start: 2023-04-04 — End: 2024-06-29

## 2023-04-04 NOTE — Patient Instructions (Addendum)
No med changes at this time.  I did order Augmentin antibiotic for possible early abscess next to your upper right tooth, call dentist tomorrow to be seen a soon as possible.  If any facial swelling, fevers or any worsening symptoms be seen in the ER.  I do not expect that to happen. If any concerns on labs or changes needed I will let you know.  Take care.   Dental Abscess  A dental abscess is an infection around a tooth that may involve pain, swelling, and a collection of pus, as well as other symptoms. Treatment is important to help with symptoms and to prevent the infection from spreading. The general types of dental abscesses are: Pulpal abscess. This abscess may form from the inner part of the tooth (pulp). Periodontal abscess. This abscess may form from the gum. What are the causes? This condition is caused by a bacterial infection in or around the tooth. It may result from: Severe tooth decay (cavities). Trauma to the tooth, such as a broken or chipped tooth. What increases the risk? This condition is more likely to develop in males. It is also more likely to develop in people who: Have cavities. Have severe gum disease. Eat sugary snacks between meals. Use tobacco products. Have diabetes. Have a weakened disease-fighting system (immune system). Do not brush and care for their teeth regularly. What are the signs or symptoms? Mild symptoms of this condition include: Tenderness. Bad breath. Fever. A bitter taste in the mouth. Pain in and around the infected tooth. Moderate symptoms of this condition include: Swollen neck glands. Chills. Pus drainage. Swelling and redness around the infected tooth, in the mouth, or in the face. Severe pain in and around the infected tooth. Severe symptoms of this condition include: Difficulty swallowing. Difficulty opening the mouth. Nausea. Vomiting. How is this diagnosed? This condition is diagnosed based on: Your symptoms and your  medical and dental history. An examination of the infected tooth. During the exam, your dental care provider may tap on the infected tooth. You may also need to have X-rays taken of the affected area. How is this treated? This condition is treated by getting rid of the infection. This may be done with: Antibiotic medicines. These may be used in certain situations. Antibacterial mouth rinse. Incision and drainage. This procedure is done by making an incision in the abscess to drain out the pus. Removing pus is the first priority in treating an abscess. A root canal. This may be performed to save the tooth. Your dental care provider accesses the visible part of your tooth (crown) with a drill and removes any infected pulp. Then the space is filled and sealed off. Tooth extraction. The tooth is pulled out if it cannot be saved by other treatment. You may also receive treatment for pain, such as: Acetaminophen or NSAIDs. Gels that contain a numbing medicine. An injection to block the pain near your nerve. Follow these instructions at home: Medicines Take over-the-counter and prescription medicines only as told by your dental care provider. If you were prescribed an antibiotic, take it as told by your dental care provider. Do not stop taking the antibiotic even if you start to feel better. If you were prescribed a gel that contains a numbing medicine, use it exactly as told in the directions. Do not use these gels for children who are younger than 42 years of age. Use an antibacterial mouth rinse as told by your dental care provider. General instructions  Gargle  with a mixture of salt and water 3-4 times a day or as needed. To make salt water, completely dissolve -1 tsp (3-6 g) of salt in 1 cup (237 mL) of warm water. Eat a soft diet while your abscess is healing. Drink enough fluid to keep your urine pale yellow. Do not apply heat to the outside of your mouth. Do not use any products that  contain nicotine or tobacco. These products include cigarettes, chewing tobacco, and vaping devices, such as e-cigarettes. If you need help quitting, ask your dental care provider. Keep all follow-up visits. This is important. How is this prevented?  Excellent dental home care, which includes brushing your teeth every morning and night with fluoride toothpaste. Floss one time each day. Get regularly scheduled dental cleanings. Consider having a dental sealant applied on teeth that have deep grooves to prevent cavities. Drink fluoridated water regularly. This includes most tap water. Check the label on bottled water to see if it contains fluoride. Reduce or eliminate sugary drinks. Eat healthy meals and snacks. Wear a mouth guard or face shield to protect your teeth while playing sports. Contact a health care provider if: Your pain is worse and is not helped by medicine. You have swelling. You see pus around the tooth. You have a fever or chills. Get help right away if: Your symptoms suddenly get worse. You have a very bad headache. You have problems breathing or swallowing. You have trouble opening your mouth. You have swelling in your neck or around your eye. These symptoms may represent a serious problem that is an emergency. Do not wait to see if the symptoms will go away. Get medical help right away. Call your local emergency services (911 in the [place] Do not drive yourself to the hospital. Summary A dental abscess is a collection of pus in or around a tooth that results from an infection. A dental abscess may result from severe tooth decay, trauma to the tooth, or severe gum disease around a tooth. Symptoms include severe pain, swelling, redness, and drainage of pus in and around the infected tooth. The first priority in treating a dental abscess is to drain out the pus. Treatment may also involve removing damage inside the tooth (root canal) or extracting the tooth. This  information is not intended to replace advice given to you by your health care provider. Make sure you discuss any questions you have with your health care provider. Document Revised: 09/29/2020 Document Reviewed: 09/29/2020 Elsevier Patient Education  2024 [employer].

## 2023-04-04 NOTE — Progress Notes (Signed)
Subjective:  Patient ID: Christine [name], female    DOB: 1960/10/16  Age: 62 y.o. MRN: [id number]  CC:  Chief Complaint  Patient presents with   Medical Management of Chronic Issues    Pt is doing okay    Dental Pain    Pt is having a tooth ache on her upper Rt side, notes she is planning to seek dental help but was hoping for an abx until she can see them as it is starting to swell     HPI Sierria H [name] presents for   Dental pain: Upper R tooth pain - past day or two, with gum swelling near the tooth. No face swelling, no fever.  Crown fell off months time ago.  Plans on dental visit soon - no appointment yet.  Some relief with salt water swishes.   Hypertension: Hydrochlorothiazide 12.5 mg daily at her last visit in February.  Some elevated readings noted in March but stable today.no new side effects.  Home readings: none.  BP Readings from Last 3 Encounters:  04/04/23 124/68  10/18/22 (!) 147/78  10/13/22 (!) 148/73   Lab Results  Component Value Date   CREATININE 0.86 10/04/2022   GERD Omeprazole 40 mg daily.  Mild posterior laryngeal edema erythema consistent with reflux with ENT eval.  Intermittent dosing of PPI with some breakthrough heartburn noted in February, increased dosing recommended. Taking PPI daily, better control. No typical breakthrough sx's.  Hypothyroidism: Lab Results  Component Value Date   TSH 0.13 (L) 01/11/2023  Synthroid 137 mcg in February.  Dosage was lowered after a low TSH in July of last year.  TSH was normal at 1.52 at that time, recent testing in June was low again at 0.13.  Synthroid was lowered again to 125 mcg/day. Same dose since June.  Taking medication daily - has been trying to take apart from other meds. No new hot or cold intolerance. No new hair or skin changes, heart palpitations or new fatigue. No new weight changes.   Prediabetes: No current meds. Weight stable.  Lab Results  Component Value Date   HGBA1C 5.8 10/04/2022    Wt Readings from Last 3 Encounters:  04/04/23 220 lb 3.2 oz (99.9 kg)  11/20/22 218 lb (98.9 kg)  10/18/22 218 lb (98.9 kg)    Anxiety Treated with prozac 10mg  every day. Working well,  alprazolam if needed. No recent need - none in past few months.      04/04/2023    3:52 PM 10/04/2022    3:17 PM 03/05/2022   10:55 AM 03/06/2021   11:44 AM  GAD 7 : Generalized Anxiety Score  Nervous, Anxious, on Edge 0 0 1 1  Control/stop worrying 0 0 0 0  Worry too much - different things 0 0 0 0  Trouble relaxing 0 0 0 0  Restless 0 0 0 0  Easily annoyed or irritable 0 0 1 0  Afraid - awful might happen 0 0 0 0  Total GAD 7 Score 0 0 2 1  Anxiety Difficulty  Not difficult at all        History Patient Active Problem List   Diagnosis Date Noted   Displaced trimalleolar fracture of right lower leg, initial encounter for closed fracture 12/14/2022   Closed displaced trimalleolar fracture of right lower leg 10/16/2022   Essential hypertension 09/05/2015   Hypothyroid 07/02/2012   Anxiety 07/02/2012   BMI 31.0-31.9,adult 07/02/2012   Past Medical History:  Diagnosis Date  Anxiety    GERD (gastroesophageal reflux disease)    HLD (hyperlipidemia)    Hypertension    Hypothyroidism    PONV (postoperative nausea and vomiting)    Past Surgical History:  Procedure Laterality Date   CESAREAN SECTION     COLONOSCOPY     more than 10 years ago   ORIF ANKLE FRACTURE Right 10/18/2022   Procedure: OPEN REDUCTION INTERNAL FIXATION (ORIF) RIGHT TRIMALLEOLAR;  Surgeon: Tarry Kos, MD;  Location: [facility];  Service: Orthopedics;  Laterality: Right;   WISDOM TOOTH EXTRACTION     Allergies  Allergen Reactions   Codeine Nausea Only   Prior to Admission medications   Medication Sig Start Date End Date Taking? Authorizing Provider  Acetaminophen (TYLENOL 8 HOUR PO) Take 1,000 mg by mouth once.   Yes [provider]  ALPRAZolam (XANAX) 0.5 MG tablet Take 0.5-1  tablets (0.25-0.5 mg total) by mouth 2 (two) times daily as needed for anxiety (or panic attack. do not combine with narcotic pain medication.). 10/17/22  Yes Shade Flood, MD  doxycycline (VIBRA-TABS) 100 MG tablet Take 1 tablet (100 mg total) by mouth 2 (two) times daily. 12/13/22  Yes Cristie Hem, PA-C  FLUoxetine (PROZAC) 10 MG tablet Take 1 tablet (10 mg total) by mouth daily. 10/04/22  Yes Shade Flood, MD  hydrochlorothiazide (MICROZIDE) 12.5 MG capsule Take 1 capsule (12.5 mg total) by mouth daily. 10/04/22  Yes Shade Flood, MD  levothyroxine (SYNTHROID) 125 MCG tablet Take 1 tablet (125 mcg total) by mouth daily. 01/16/23  Yes Shade Flood, MD  omeprazole (PRILOSEC) 40 MG capsule TAKE 1 CAPSULE (40 MG TOTAL) BY MOUTH DAILY. 09/03/22  Yes Shade Flood, MD   Social History   Socioeconomic History   Marital status: Widowed    Spouse name: Not on file   Number of children: 2   Years of education: Not on file   Highest education level: Not on file  Occupational History   Occupation: Runner, broadcasting/film/video   Occupation: Surveyor, minerals  Tobacco Use   Smoking status: Former    Current packs/day: 0.00    Types: Cigarettes    Quit date: 04/25/1985    Years since quitting: 37.9   Smokeless tobacco: Never  Vaping Use   Vaping status: Never Used  Substance and Sexual Activity   Alcohol use: No   Drug use: No   Sexual activity: Never  Other Topics Concern   Not on file  Social History Narrative   ** Merged History Encounter **       Social Determinants of Health   Financial Resource Strain: Not on file  Food Insecurity: Not on file  Transportation Needs: Not on file  Physical Activity: Not on file  Stress: Not on file  Social Connections: Not on file  Intimate Partner Violence: Not on file    Review of Systems  Constitutional:  Negative for fatigue and unexpected weight change.  Respiratory:  Negative for chest tightness and shortness of breath.   Cardiovascular:   Negative for chest pain, palpitations and leg swelling.  Gastrointestinal:  Negative for abdominal pain and blood in stool.  Neurological:  Negative for dizziness, syncope, light-headedness and headaches.  Other per HPI    Objective:   Vitals:   04/04/23 1555  BP: 124/68  Pulse: 82  Temp: 98.4 F (36.9 C)  TempSrc: Temporal  SpO2: 98%  Weight: 220 lb 3.2 oz (99.9 kg)  Height: 5\' 6"  (1.676  m)     Physical Exam Vitals reviewed.  Constitutional:      Appearance: Normal appearance. She is well-developed.  HENT:     Head: Normocephalic and atraumatic.     Mouth/Throat:     Comments: R upper molar with decay, buccal gum edematous, slight erythema with light yellow exudate from recess with pressure. Ttp. No facial swelling.  Eyes:     Conjunctiva/sclera: Conjunctivae normal.     Pupils: Pupils are equal, round, and reactive to light.  Neck:     Vascular: No carotid bruit.  Cardiovascular:     Rate and Rhythm: Normal rate and regular rhythm.     Heart sounds: Normal heart sounds.  Pulmonary:     Effort: Pulmonary effort is normal.     Breath sounds: Normal breath sounds.  Abdominal:     Palpations: Abdomen is soft. There is no pulsatile mass.     Tenderness: There is no abdominal tenderness.  Musculoskeletal:     Right lower leg: No edema.     Left lower leg: No edema.  Skin:    General: Skin is warm and dry.  Neurological:     Mental Status: She is alert and oriented to person, place, and time.  Psychiatric:        Mood and Affect: Mood normal.        Behavior: Behavior normal.      Assessment & Plan:  SWANZETTA STEPHEN is a 62 y.o. female . Hypothyroidism, unspecified type - Plan: TSH, CANCELED: TSH  - check labs, adjust meds accordingly.   Need for influenza vaccination - Plan: Flu vaccine trivalent PF, 6mos and older(Flulaval,Afluria,Fluarix,Fluzone)  Need for tetanus booster - Plan: Tdap vaccine greater than or equal to 7yo IM  Prediabetes - Plan: Hemoglobin  A1c, Comprehensive metabolic panel, CANCELED: Comprehensive metabolic panel  - diet/ exercise approach. Check labs   Hyperlipidemia, unspecified hyperlipidemia type - Plan: Comprehensive metabolic panel, Lipid panel, CANCELED: Comprehensive metabolic panel, CANCELED: Lipid panel  - check labs, determine stain need   Acute periapical abscess - Plan: amoxicillin-clavulanate (AUGMENTIN) 875-125 MG tablet  - early abscess without facial swelling, surrounding maxillary pain, fever or other concerning symptoms. Started augmentin, and plans on dental eval soon. ER precautions.   - call noted from vomiting episode after augmentin. Single episode and may have been from food night prior. Alternative would include flagyl which may have more side effects. Added chlorhexidine and will call in few days to see if augmentin tolerated on repeat doses or med change needed.   Meds ordered this encounter  Medications   amoxicillin-clavulanate (AUGMENTIN) 875-125 MG tablet    Sig: Take 1 tablet by mouth 2 (two) times daily.    Dispense:  20 tablet    Refill:  0   Patient Instructions  No med changes at this time.  I did order Augmentin antibiotic for possible early abscess next to your upper right tooth, call dentist tomorrow to be seen a soon as possible.  If any facial swelling, fevers or any worsening symptoms be seen in the ER.  I do not expect that to happen. If any concerns on labs or changes needed I will let you know.  Take care.   Dental Abscess  A dental abscess is an infection around a tooth that may involve pain, swelling, and a collection of pus, as well as other symptoms. Treatment is important to help with symptoms and to prevent the infection from spreading. The general types of  dental abscesses are: Pulpal abscess. This abscess may form from the inner part of the tooth (pulp). Periodontal abscess. This abscess may form from the gum. What are the causes? This condition is caused by a bacterial  infection in or around the tooth. It may result from: Severe tooth decay (cavities). Trauma to the tooth, such as a broken or chipped tooth. What increases the risk? This condition is more likely to develop in males. It is also more likely to develop in people who: Have cavities. Have severe gum disease. Eat sugary snacks between meals. Use tobacco products. Have diabetes. Have a weakened disease-fighting system (immune system). Do not brush and care for their teeth regularly. What are the signs or symptoms? Mild symptoms of this condition include: Tenderness. Bad breath. Fever. A bitter taste in the mouth. Pain in and around the infected tooth. Moderate symptoms of this condition include: Swollen neck glands. Chills. Pus drainage. Swelling and redness around the infected tooth, in the mouth, or in the face. Severe pain in and around the infected tooth. Severe symptoms of this condition include: Difficulty swallowing. Difficulty opening the mouth. Nausea. Vomiting. How is this diagnosed? This condition is diagnosed based on: Your symptoms and your medical and dental history. An examination of the infected tooth. During the exam, your dental care provider may tap on the infected tooth. You may also need to have X-rays taken of the affected area. How is this treated? This condition is treated by getting rid of the infection. This may be done with: Antibiotic medicines. These may be used in certain situations. Antibacterial mouth rinse. Incision and drainage. This procedure is done by making an incision in the abscess to drain out the pus. Removing pus is the first priority in treating an abscess. A root canal. This may be performed to save the tooth. Your dental care provider accesses the visible part of your tooth (crown) with a drill and removes any infected pulp. Then the space is filled and sealed off. Tooth extraction. The tooth is pulled out if it cannot be saved by other  treatment. You may also receive treatment for pain, such as: Acetaminophen or NSAIDs. Gels that contain a numbing medicine. An injection to block the pain near your nerve. Follow these instructions at home: Medicines Take over-the-counter and prescription medicines only as told by your dental care provider. If you were prescribed an antibiotic, take it as told by your dental care provider. Do not stop taking the antibiotic even if you start to feel better. If you were prescribed a gel that contains a numbing medicine, use it exactly as told in the directions. Do not use these gels for children who are younger than 74 years of age. Use an antibacterial mouth rinse as told by your dental care provider. General instructions  Gargle with a mixture of salt and water 3-4 times a day or as needed. To make salt water, completely dissolve -1 tsp (3-6 g) of salt in 1 cup (237 mL) of warm water. Eat a soft diet while your abscess is healing. Drink enough fluid to keep your urine pale yellow. Do not apply heat to the outside of your mouth. Do not use any products that contain nicotine or tobacco. These products include cigarettes, chewing tobacco, and vaping devices, such as e-cigarettes. If you need help quitting, ask your dental care provider. Keep all follow-up visits. This is important. How is this prevented?  Excellent dental home care, which includes brushing your teeth every morning  and night with fluoride toothpaste. Floss one time each day. Get regularly scheduled dental cleanings. Consider having a dental sealant applied on teeth that have deep grooves to prevent cavities. Drink fluoridated water regularly. This includes most tap water. Check the label on bottled water to see if it contains fluoride. Reduce or eliminate sugary drinks. Eat healthy meals and snacks. Wear a mouth guard or face shield to protect your teeth while playing sports. Contact a health care provider if: Your pain is  worse and is not helped by medicine. You have swelling. You see pus around the tooth. You have a fever or chills. Get help right away if: Your symptoms suddenly get worse. You have a very bad headache. You have problems breathing or swallowing. You have trouble opening your mouth. You have swelling in your neck or around your eye. These symptoms may represent a serious problem that is an emergency. Do not wait to see if the symptoms will go away. Get medical help right away. Call your local emergency services (911 in the [place] Do not drive yourself to the hospital. Summary A dental abscess is a collection of pus in or around a tooth that results from an infection. A dental abscess may result from severe tooth decay, trauma to the tooth, or severe gum disease around a tooth. Symptoms include severe pain, swelling, redness, and drainage of pus in and around the infected tooth. The first priority in treating a dental abscess is to drain out the pus. Treatment may also involve removing damage inside the tooth (root canal) or extracting the tooth. This information is not intended to replace advice given to you by your health care provider. Make sure you discuss any questions you have with your health care provider. Document Revised: 09/29/2020 Document Reviewed: 09/29/2020 Elsevier Patient Education  2024 Elsevier Inc.     Signed,   Meredith Staggers, MD [facility], [facility] 04/04/23 4:52 PM

## 2023-04-05 ENCOUNTER — Telehealth: Payer: Self-pay | Admitting: Family Medicine

## 2023-04-05 DIAGNOSIS — K047 Periapical abscess without sinus: Secondary | ICD-10-CM

## 2023-04-05 LAB — HEMOGLOBIN A1C: Hgb A1c MFr Bld: 5.8 % (ref 4.6–6.5)

## 2023-04-05 MED ORDER — CHLORHEXIDINE GLUCONATE 0.12 % MT SOLN
15.0000 mL | Freq: Two times a day (BID) | OROMUCOSAL | 0 refills | Status: DC
Start: 2023-04-05 — End: 2024-06-29

## 2023-04-05 NOTE — Telephone Encounter (Signed)
Caller name: Christine [name]  On DPR?: Yes  Call back number: [phone] (mobile)  Provider they see: Shade Flood, MD  Reason for call:   Pt states ABX made her extremely nauseous amoxicillin-clavulanate (AUGMENTIN) 875-125 MG tablet and could you prescribe something else.

## 2023-04-05 NOTE — Telephone Encounter (Signed)
Pt reports nausea to the point she vomited this morning, did take it with her dinner but notes she had a large plate of spaghetti didn't know if maybe she ate too much (also the concern of reflux with a very acidic dinner may have also caused some issues in combination with the Augmentin) pt willing to try one more dose Augmentin but would like the others sent in in case she finds she cannot tolerate over the weekend to ensure she doesn't go without for the weekend, she does have an appointment

## 2023-04-05 NOTE — Telephone Encounter (Signed)
Depending on how much nausea she is experiencing, if it is only mild try 1 more dose to see if that is tolerated.  The alternate option would be switching to amoxicillin and metronidazole and that can also cause quite a bit of nausea.  Let me know which she would like to do.  I will also send in some chlorhexidine in the meantime.  Was she able to obtain a dental appointment?

## 2023-04-05 NOTE — Telephone Encounter (Signed)
Pt states she is having nausea with the Augmentin and she is wanting to know if we could change the medication

## 2023-04-05 NOTE — Telephone Encounter (Signed)
Try Augmentin dose tonight, start chlorhexidiine as well in the meantime and I can reach out to her this weekend and send in different med if augmentin not tolerated tonight.

## 2023-04-07 ENCOUNTER — Encounter: Payer: Self-pay | Admitting: Family Medicine

## 2023-04-07 DIAGNOSIS — Z419 Encounter for procedure for purposes other than remedying health state, unspecified: Secondary | ICD-10-CM | POA: Diagnosis not present

## 2023-04-08 NOTE — Telephone Encounter (Signed)
Called patient to check status. Doing better. Able to tolerate augmentin with leftover zofran and using chlorhexidine rinse. Will call dentist tomorrow but feeling better. Rtc precautions.

## 2023-04-09 ENCOUNTER — Other Ambulatory Visit: Payer: Medicaid Other

## 2023-04-11 ENCOUNTER — Other Ambulatory Visit (INDEPENDENT_AMBULATORY_CARE_PROVIDER_SITE_OTHER): Payer: Medicaid Other

## 2023-04-11 DIAGNOSIS — E039 Hypothyroidism, unspecified: Secondary | ICD-10-CM | POA: Diagnosis not present

## 2023-04-11 DIAGNOSIS — R7303 Prediabetes: Secondary | ICD-10-CM

## 2023-04-11 DIAGNOSIS — E785 Hyperlipidemia, unspecified: Secondary | ICD-10-CM

## 2023-04-11 LAB — COMPREHENSIVE METABOLIC PANEL
ALT: 38 U/L — ABNORMAL HIGH (ref 0–35)
AST: 23 U/L (ref 0–37)
Albumin: 3.9 g/dL (ref 3.5–5.2)
Alkaline Phosphatase: 55 U/L (ref 39–117)
BUN: 15 mg/dL (ref 6–23)
CO2: 30 meq/L (ref 19–32)
Calcium: 9.1 mg/dL (ref 8.4–10.5)
Chloride: 104 meq/L (ref 96–112)
Creatinine, Ser: 0.97 mg/dL (ref 0.40–1.20)
GFR: 62.88 mL/min (ref >60.00)
Glucose, Bld: 109 mg/dL — ABNORMAL HIGH (ref 70–99)
Potassium: 4.5 meq/L (ref 3.5–5.1)
Sodium: 140 meq/L (ref 135–145)
Total Bilirubin: 0.7 mg/dL (ref 0.2–1.2)
Total Protein: 6.7 g/dL (ref 6.0–8.3)

## 2023-04-11 LAB — TSH: TSH: 6.83 u[IU]/mL — ABNORMAL HIGH (ref 0.35–5.50)

## 2023-04-11 LAB — LIPID PANEL
Cholesterol: 216 mg/dL — ABNORMAL HIGH (ref 0–200)
HDL: 51 mg/dL (ref >39.00)
LDL Cholesterol: 147 mg/dL — ABNORMAL HIGH (ref 0–99)
NonHDL: 164.58
Total CHOL/HDL Ratio: 4
Triglycerides: 88 mg/dL (ref 0.0–149.0)
VLDL: 17.6 mg/dL (ref 0.0–40.0)

## 2023-04-17 ENCOUNTER — Telehealth: Payer: Self-pay

## 2023-04-17 NOTE — Telephone Encounter (Signed)
-----   Message from Shade Flood sent at 04/16/2023  5:04 PM EDT ----- Results sent by MyChart, please make sure she has received results.

## 2023-05-01 ENCOUNTER — Other Ambulatory Visit: Payer: Self-pay | Admitting: Family Medicine

## 2023-05-07 ENCOUNTER — Telehealth: Payer: Self-pay | Admitting: Orthopaedic Surgery

## 2023-05-07 DIAGNOSIS — Z419 Encounter for procedure for purposes other than remedying health state, unspecified: Secondary | ICD-10-CM | POA: Diagnosis not present

## 2023-05-07 NOTE — Telephone Encounter (Signed)
Called patient. Will need to be evaluated for a follow up before FCE is ordered, per Dr.Xu's last note on 03/26/2023. Patient has been scheduled.

## 2023-05-07 NOTE — Telephone Encounter (Signed)
Patient called needing extension Physical Therapy that was work conditioning. Analysis is what she has to do but she wasn't sure if she should see Dr. Roda Shutters before or after. CB#[phone]

## 2023-05-09 ENCOUNTER — Other Ambulatory Visit: Payer: Self-pay | Admitting: Family Medicine

## 2023-05-16 ENCOUNTER — Ambulatory Visit (INDEPENDENT_AMBULATORY_CARE_PROVIDER_SITE_OTHER): Payer: Worker's Compensation | Admitting: Physician Assistant

## 2023-05-16 ENCOUNTER — Encounter: Payer: Self-pay | Admitting: Physician Assistant

## 2023-05-16 DIAGNOSIS — S82851D Displaced trimalleolar fracture of right lower leg, subsequent encounter for closed fracture with routine healing: Secondary | ICD-10-CM

## 2023-05-16 NOTE — Progress Notes (Signed)
Post-Op Visit Note   Patient: Christine [name]           Date of Birth: 11-21-1960           MRN: [id number] Visit Date: 05/16/2023 PCP: Shade Flood, MD   Assessment & Plan:  Chief Complaint:  Chief Complaint  Patient presents with   Right Ankle - Follow-up   Visit Diagnoses:  1. Closed displaced trimalleolar fracture of right ankle with routine healing, subsequent encounter     Plan: Patient is a pleasant 62 year old female who comes in today 7 months status post ORIF right ankle fracture.  This has been filed under work comp.  She still notes stiffness in the morning as well as soreness at the end of the day.  She also notes occasional burning sensation to the right ankle.  She is not taking medication for this.  She has been in physical therapy working on work conditioning.  She has 1 more visit left.  Examination of her right ankle reveals mild swelling.  Dorsiflexion to neutral.  Noted bony tenderness.  She is neurovascular intact distally.  This point, she will continue current work restrictions.  Note provided today.  We have written a prescription for an FCE.  She will follow-up with Dr. Roda Shutters once this has been completed.  Follow-Up Instructions: Return for f/u after FCE.   Orders:  Orders Placed This Encounter  Procedures   Ambulatory referral to Physical Therapy   No orders of the defined types were placed in this encounter.   Imaging: No new imaging  PMFS History: Patient Active Problem List   Diagnosis Date Noted   Displaced trimalleolar fracture of right lower leg, initial encounter for closed fracture 12/14/2022   Closed displaced trimalleolar fracture of right lower leg 10/16/2022   Essential hypertension 09/05/2015   Hypothyroid 07/02/2012   Anxiety 07/02/2012   BMI 31.0-31.9,adult 07/02/2012   Past Medical History:  Diagnosis Date   Anxiety    GERD (gastroesophageal reflux disease)    HLD (hyperlipidemia)    Hypertension    Hypothyroidism    PONV  (postoperative nausea and vomiting)     Family History  Problem Relation Age of Onset   Irritable bowel syndrome Mother    Diabetes Mother    Hyperlipidemia Mother    Liver cancer Mother        unsure of original site   Colon polyps Sister    Breast cancer Maternal Grandmother    Clotting disorder Nephew    Diabetes Nephew    Colon cancer Neg Hx    Esophageal cancer Neg Hx    Rectal cancer Neg Hx    Stomach cancer Neg Hx     Past Surgical History:  Procedure Laterality Date   CESAREAN SECTION     COLONOSCOPY     more than 10 years ago   ORIF ANKLE FRACTURE Right 10/18/2022   Procedure: OPEN REDUCTION INTERNAL FIXATION (ORIF) RIGHT TRIMALLEOLAR;  Surgeon: Tarry Kos, MD;  Location: [facility];  Service: Orthopedics;  Laterality: Right;   WISDOM TOOTH EXTRACTION     Social History   Occupational History   Occupation: Runner, broadcasting/film/video   Occupation: Surveyor, minerals  Tobacco Use   Smoking status: Former    Current packs/day: 0.00    Types: Cigarettes    Quit date: 04/25/1985    Years since quitting: 38.0   Smokeless tobacco: Never  Vaping Use   Vaping status: Never Used  Substance and Sexual  Activity   Alcohol use: No   Drug use: No   Sexual activity: Never

## 2023-06-05 ENCOUNTER — Other Ambulatory Visit: Payer: Self-pay | Admitting: Family Medicine

## 2023-06-05 DIAGNOSIS — E039 Hypothyroidism, unspecified: Secondary | ICD-10-CM

## 2023-06-07 DIAGNOSIS — Z419 Encounter for procedure for purposes other than remedying health state, unspecified: Secondary | ICD-10-CM | POA: Diagnosis not present

## 2023-06-08 ENCOUNTER — Other Ambulatory Visit: Payer: Self-pay | Admitting: Family Medicine

## 2023-06-19 ENCOUNTER — Encounter: Payer: Self-pay | Admitting: Orthopaedic Surgery

## 2023-06-19 ENCOUNTER — Ambulatory Visit (INDEPENDENT_AMBULATORY_CARE_PROVIDER_SITE_OTHER): Payer: Worker's Compensation | Admitting: Orthopaedic Surgery

## 2023-06-19 ENCOUNTER — Other Ambulatory Visit (INDEPENDENT_AMBULATORY_CARE_PROVIDER_SITE_OTHER): Payer: Worker's Compensation

## 2023-06-19 DIAGNOSIS — S82851A Displaced trimalleolar fracture of right lower leg, initial encounter for closed fracture: Secondary | ICD-10-CM

## 2023-06-19 NOTE — Progress Notes (Signed)
Office Visit Note   Patient: Christine [name]           Date of Birth: [birth year]           MRN: [id number] Visit Date: 06/19/2023              Requested by: Shade Flood, MD [address] [place],  [place] [address] PCP: Shade Flood, MD   Assessment & Plan: Visit Diagnoses:  1. Displaced trimalleolar fracture of right lower leg, initial encounter for closed fracture     Plan: Christine [name] is a 62 year old female who is 8 months status post right trimalleolar ankle fracture.  FCE reviewed today and shows valid results.  She is able to perform light duty with abilities into the medium physical demand level.  Please refer to the FCE for further details.  She is released back to work with those restrictions.  She is now at MMI.  Her rating is 20% which has broken down into 10% for the surgery and 10% for the limitation in range of motion.  At this point we will release her and follow-up as needed.  Total face to face encounter time was greater than 25 minutes and over half of this time was spent in counseling and/or coordination of care.  Follow-Up Instructions: No follow-ups on file.   Orders:  Orders Placed This Encounter  Procedures   XR Ankle Complete Right   No orders of the defined types were placed in this encounter.     Procedures: No procedures performed   Clinical Data: No additional findings.   Subjective: Chief Complaint  Patient presents with   Right Ankle - Pain    FCE review. ORIF right ankle 10/18/2022    HPI Christine [name] is a 62 year old female who is following up today for FCE review and her rate and release.  She reports no changes in her symptoms.  Review of Systems   Objective: Vital Signs: LMP 08/21/2015 (Approximate)   Physical Exam  Ortho Exam Exam of the left ankle shows fully healed surgical scars.  She has dorsiflexion to 0 degrees.  Functional plantarflexion, inversion, eversion.  Specialty Comments:  No specialty comments  available.  Imaging: XR Ankle Complete Right  Result Date: 06/19/2023 X-rays of the right ankle a fully healed try malleolar ankle fracture without any complication to the hardware.    PMFS History: Patient Active Problem List   Diagnosis Date Noted   Displaced trimalleolar fracture of right lower leg, initial encounter for closed fracture 12/14/2022   Closed displaced trimalleolar fracture of right lower leg 10/16/2022   Essential hypertension 09/05/2015   Hypothyroid 07/02/2012   Anxiety 07/02/2012   BMI 31.0-31.9,adult 07/02/2012   Past Medical History:  Diagnosis Date   Anxiety    GERD (gastroesophageal reflux disease)    HLD (hyperlipidemia)    Hypertension    Hypothyroidism    PONV (postoperative nausea and vomiting)     Family History  Problem Relation Age of Onset   Irritable bowel syndrome Mother    Diabetes Mother    Hyperlipidemia Mother    Liver cancer Mother        unsure of original site   Colon polyps Sister    Breast cancer Maternal Grandmother    Clotting disorder Nephew    Diabetes Nephew    Colon cancer Neg Hx    Esophageal cancer Neg Hx    Rectal cancer Neg Hx    Stomach cancer Neg Hx  Past Surgical History:  Procedure Laterality Date   CESAREAN SECTION     COLONOSCOPY     more than 10 years ago   ORIF ANKLE FRACTURE Right 10/18/2022   Procedure: OPEN REDUCTION INTERNAL FIXATION (ORIF) RIGHT TRIMALLEOLAR;  Surgeon: Tarry Kos, MD;  Location: [facility];  Service: Orthopedics;  Laterality: Right;   WISDOM TOOTH EXTRACTION     Social History   Occupational History   Occupation: Runner, broadcasting/film/video   Occupation: Surveyor, minerals  Tobacco Use   Smoking status: Former    Current packs/day: 0.00    Types: Cigarettes    Quit date: 04/25/1985    Years since quitting: 38.1   Smokeless tobacco: Never  Vaping Use   Vaping status: Never Used  Substance and Sexual Activity   Alcohol use: No   Drug use: No   Sexual activity: Never

## 2023-06-26 ENCOUNTER — Telehealth: Payer: Self-pay

## 2023-06-26 NOTE — Telephone Encounter (Signed)
Tom, therapist that evaluated patient would like a call back at [phone].  Stated that it was a confusion with the report.  Please advise.  Thank you.

## 2023-06-26 NOTE — Telephone Encounter (Signed)
Spoke to tom

## 2023-06-28 ENCOUNTER — Telehealth: Payer: Self-pay | Admitting: Family Medicine

## 2023-06-28 NOTE — Telephone Encounter (Signed)
Has she seen dentist for this condition?  I can try to call her later to go through plan if unable to be seen, but ideally would like her to be seen by someone today if possible to examine that area and see if abscess present.  That was the concern previously in August.

## 2023-06-28 NOTE — Telephone Encounter (Signed)
Patient notes she is seeing a dentist after thanksgiving but she called 4 places and noted they were not taking new patinets and so she had to wait, she is hoping to repair the tooth some time in December

## 2023-06-28 NOTE — Telephone Encounter (Signed)
Caller name: EMREY BERO  On DPR?: Yes  Call back number: [phone] (mobile)  Provider they see: Shade Flood, MD  Reason for call:   Pt is asking you re- prescribe ABX for tooth, Pt has Medicaid states you've prescribed before.

## 2023-06-28 NOTE — Telephone Encounter (Signed)
Called patient.   Tooth has felt sensitive last night and today, R upper back tooth - but feeling a little better at this time. Feels better currently.  No fever, gum or face swelling.  Dental visit planned after Thanksgiving.   With improved symptoms currently, on symptoms symptoms just yesterday, and no face/gum swelling will continue to have her monitor for any worsening, with urgent care/ER precautions if worsening over the weekend (or if in office eval either with [place] or dentist next week if it starts to worsen after the weekend).   Advised to avoid very hot or cold foods to that area, soft foods, and monitor for any worsening over the weekend.  She is okay with this plan.  Will hold on new medications for now.

## 2023-06-28 NOTE — Telephone Encounter (Signed)
Patient states you have sent ABX in before for tooth issue, please advise typical policy is a visit is needed for ABX treatment

## 2023-07-07 DIAGNOSIS — Z419 Encounter for procedure for purposes other than remedying health state, unspecified: Secondary | ICD-10-CM | POA: Diagnosis not present

## 2023-08-07 DIAGNOSIS — Z419 Encounter for procedure for purposes other than remedying health state, unspecified: Secondary | ICD-10-CM | POA: Diagnosis not present

## 2023-09-07 DIAGNOSIS — Z419 Encounter for procedure for purposes other than remedying health state, unspecified: Secondary | ICD-10-CM | POA: Diagnosis not present

## 2023-09-17 ENCOUNTER — Other Ambulatory Visit: Payer: Self-pay | Admitting: Family Medicine

## 2023-09-17 DIAGNOSIS — I10 Essential (primary) hypertension: Secondary | ICD-10-CM

## 2023-10-05 DIAGNOSIS — Z419 Encounter for procedure for purposes other than remedying health state, unspecified: Secondary | ICD-10-CM | POA: Diagnosis not present

## 2023-11-16 DIAGNOSIS — Z419 Encounter for procedure for purposes other than remedying health state, unspecified: Secondary | ICD-10-CM | POA: Diagnosis not present

## 2023-12-16 ENCOUNTER — Other Ambulatory Visit: Payer: Self-pay | Admitting: Family Medicine

## 2023-12-16 DIAGNOSIS — Z419 Encounter for procedure for purposes other than remedying health state, unspecified: Secondary | ICD-10-CM | POA: Diagnosis not present

## 2024-01-16 DIAGNOSIS — Z419 Encounter for procedure for purposes other than remedying health state, unspecified: Secondary | ICD-10-CM | POA: Diagnosis not present

## 2024-02-15 DIAGNOSIS — Z419 Encounter for procedure for purposes other than remedying health state, unspecified: Secondary | ICD-10-CM | POA: Diagnosis not present

## 2024-02-24 ENCOUNTER — Other Ambulatory Visit: Payer: Self-pay | Admitting: Family Medicine

## 2024-02-24 DIAGNOSIS — E039 Hypothyroidism, unspecified: Secondary | ICD-10-CM

## 2024-03-17 DIAGNOSIS — Z419 Encounter for procedure for purposes other than remedying health state, unspecified: Secondary | ICD-10-CM | POA: Diagnosis not present

## 2024-03-23 ENCOUNTER — Other Ambulatory Visit: Payer: Self-pay | Admitting: Family Medicine

## 2024-03-23 DIAGNOSIS — K047 Periapical abscess without sinus: Secondary | ICD-10-CM

## 2024-04-17 DIAGNOSIS — Z419 Encounter for procedure for purposes other than remedying health state, unspecified: Secondary | ICD-10-CM | POA: Diagnosis not present

## 2024-05-22 ENCOUNTER — Other Ambulatory Visit: Payer: Self-pay | Admitting: Family Medicine

## 2024-05-22 DIAGNOSIS — E039 Hypothyroidism, unspecified: Secondary | ICD-10-CM

## 2024-06-08 ENCOUNTER — Encounter: Payer: Self-pay | Admitting: Radiology

## 2024-06-17 DIAGNOSIS — Z419 Encounter for procedure for purposes other than remedying health state, unspecified: Secondary | ICD-10-CM | POA: Diagnosis not present

## 2024-06-25 ENCOUNTER — Other Ambulatory Visit: Payer: Self-pay | Admitting: Family Medicine

## 2024-06-25 DIAGNOSIS — E039 Hypothyroidism, unspecified: Secondary | ICD-10-CM

## 2024-06-25 NOTE — Telephone Encounter (Signed)
 LVM to call and schedule an appt. Pt has not been seen since 03/2023 Someone answered and hung up.

## 2024-06-28 ENCOUNTER — Other Ambulatory Visit: Payer: Self-pay | Admitting: Family Medicine

## 2024-06-29 ENCOUNTER — Encounter: Payer: Self-pay | Admitting: Family Medicine

## 2024-06-29 ENCOUNTER — Telehealth (INDEPENDENT_AMBULATORY_CARE_PROVIDER_SITE_OTHER): Admitting: Family Medicine

## 2024-06-29 DIAGNOSIS — F410 Panic disorder [episodic paroxysmal anxiety] without agoraphobia: Secondary | ICD-10-CM

## 2024-06-29 DIAGNOSIS — I10 Essential (primary) hypertension: Secondary | ICD-10-CM

## 2024-06-29 DIAGNOSIS — Z79899 Other long term (current) drug therapy: Secondary | ICD-10-CM | POA: Diagnosis not present

## 2024-06-29 DIAGNOSIS — E785 Hyperlipidemia, unspecified: Secondary | ICD-10-CM | POA: Diagnosis not present

## 2024-06-29 DIAGNOSIS — R7303 Prediabetes: Secondary | ICD-10-CM | POA: Diagnosis not present

## 2024-06-29 DIAGNOSIS — E039 Hypothyroidism, unspecified: Secondary | ICD-10-CM | POA: Diagnosis not present

## 2024-06-29 MED ORDER — FLUOXETINE HCL 10 MG PO TABS
10.0000 mg | ORAL_TABLET | Freq: Every day | ORAL | 1 refills | Status: AC
Start: 2024-06-29 — End: ?

## 2024-06-29 MED ORDER — OMEPRAZOLE 40 MG PO CPDR: 40.0000 mg | DELAYED_RELEASE_CAPSULE | Freq: Every day | ORAL | 1 refills | Status: AC

## 2024-06-29 MED ORDER — LEVOTHYROXINE SODIUM 125 MCG PO TABS: 125.0000 ug | ORAL_TABLET | Freq: Every day | ORAL | 0 refills | Status: AC

## 2024-06-29 MED ORDER — ALPRAZOLAM 0.5 MG PO TABS: 0.2500 mg | ORAL_TABLET | Freq: Two times a day (BID) | ORAL | 0 refills | Status: AC | PRN

## 2024-06-29 MED ORDER — HYDROCHLOROTHIAZIDE 12.5 MG PO CAPS: 12.5000 mg | ORAL_CAPSULE | Freq: Every day | ORAL | 2 refills | Status: AC

## 2024-06-29 NOTE — Patient Instructions (Signed)
 Good talking with you today.  No med changes at this time other than fluoxetine  which you can take 2/day and we will follow-up in a month and see how that is working.  If you have any questions in the meantime let me know.  Please have labs at the [facility] location below within the next 1 week.  I will let you know if I have any concerns and any changes needed prior to our follow-up in person in 1 month.  Meds were refilled.  If possible, check some home blood pressure readings and let me know those results or can bring those to your next visit if they are stable.  Take care!  [facility] or xray: Walk in 8:30-4:30 during weekdays, no appointment needed [address]  [place], [place] [address]

## 2024-06-29 NOTE — Progress Notes (Addendum)
 Virtual Visit via Video Note  I connected with Christine [name] on 06/29/24 at 10:03 AM by a video enabled telemedicine application and verified that I am speaking with the correct person using two identifiers.  Patient location: home. By self.  My location: office - [place].    I discussed the limitations, risks, security and privacy concerns of performing an evaluation and management service by telephone and the availability of in person appointments. I also discussed with the patient that there may be a patient responsible charge related to this service. The patient expressed understanding and agreed to proceed, consent obtained  Chief complaint:  Chief Complaint  Patient presents with   Hypertension   Hypothyroidism    History of Present Illness: Christine [name] is a 63 y.o. female, virtual visit for follow-up of medications.  Last visit with me in August 2024. .  89-month follow-up recommended. No barriers to care.  Hypertension: Treated with hydrochlorothiazide  12.5 mg daily. No new side effects.  Home readings: none, no machine.  Constitutional: Negative for fatigue and unexpected weight change.  Eyes: Negative for visual disturbance.  Respiratory: Negative for cough, chest tightness.  Possible shortness of breath with driving at times, better with few deep breaths. Not with activity or exertion.  rare symptoms- every few weeks only. Possibly stressed/anxious at that time.  Cardiovascular: Negative for chest pain, palpitations and leg swelling.  Gastrointestinal: Negative for abdominal pain and blood in stool.  Neurological: Negative for dizziness, light-headedness and headaches.   BP Readings from Last 3 Encounters:  04/04/23 124/68  10/18/22 (!) 147/78  10/13/22 (!) 148/73   Lab Results  Component Value Date   CREATININE 0.97 04/11/2023    Hypothyroidism: Lab Results  Component Value Date   TSH 6.83 (H) 04/11/2023  Elevated readings last year.  Previously had  lowered dosage of medications for a low TSH previously.  She was taking medication consistently at her September 2024 visit.  Borderline TSH at that time, continued same regimen.  Still 125 mcg daily.  Taking medication daily.  No new hot or cold intolerance. Sometimes feels warm at night. No new hair or skin changes, heart palpitations or new fatigue. No new weight changes.   GERD Previous ENT eval with edema consistent with reflux.  Omeprazole  40 mg daily. Working well. Daily dosing. Breakthrough if missing meds.    Prediabetes: Avoiding soda/sweet tea. Some fast food depending on schedule. Exercise: does play in a band, loading equipment. Increased physical activity. Directin musical at school.   Lab Results  Component Value Date   HGBA1C 5.8 04/04/2023   Wt Readings from Last 3 Encounters:  04/04/23 220 lb 3.2 oz (99.9 kg)  11/20/22 218 lb (98.9 kg)  10/18/22 218 lb (98.9 kg)  Home weight - last week 225.    Anxiety: Prozac  10mg  every day. Some increased stress. Considering higher dose.  Episodic xanax  - rare use. Needs refill. Lost bottle that had 3-4 pills. Occasionally takes 1/2 pill. Some stressors this summer. No side effects with meds. Controlled substance database (PDMP) reviewed. No concerns appreciated. Last rx #10, 10/17/22.      06/29/2024    9:37 AM 04/04/2023    3:52 PM 10/04/2022    3:17 PM 03/05/2022   10:55 AM  GAD 7 : Generalized Anxiety Score  Nervous, Anxious, on Edge 1 0 0 1  Control/stop worrying 0 0 0 0  Worry too much - different things 1 0 0 0  Trouble relaxing 1 0  0 0  Restless 1 0 0 0  Easily annoyed or irritable 1 0 0 1  Afraid - awful might happen 2 0 0 0  Total GAD 7 Score 7 0 0 2  Anxiety Difficulty Somewhat difficult  Not difficult at all       Patient Active Problem List   Diagnosis Date Noted   Displaced trimalleolar fracture of right lower leg, initial encounter for closed fracture 12/14/2022   Closed displaced trimalleolar fracture  of right lower leg 10/16/2022   Essential hypertension 09/05/2015   Hypothyroid 07/02/2012   Anxiety 07/02/2012   BMI 31.0-31.9,adult 07/02/2012   Past Medical History:  Diagnosis Date   Anxiety    GERD (gastroesophageal reflux disease)    HLD (hyperlipidemia)    Hypertension    Hypothyroidism    PONV (postoperative nausea and vomiting)    Past Surgical History:  Procedure Laterality Date   CESAREAN SECTION     COLONOSCOPY     more than 10 years ago   ORIF ANKLE FRACTURE Right 10/18/2022   Procedure: OPEN REDUCTION INTERNAL FIXATION (ORIF) RIGHT TRIMALLEOLAR;  Surgeon: Jerri Kay HERO, MD;  Location: [facility];  Service: Orthopedics;  Laterality: Right;   WISDOM TOOTH EXTRACTION     Allergies  Allergen Reactions   Codeine Nausea Only   Prior to Admission medications   Medication Sig Start Date End Date Taking? Authorizing Provider  ALPRAZolam  (XANAX ) 0.5 MG tablet Take 0.5-1 tablets (0.25-0.5 mg total) by mouth 2 (two) times daily as needed for anxiety (or panic attack. do not combine with narcotic pain medication.). 10/17/22  Yes Levora Reyes SAUNDERS, MD  FLUoxetine  (PROZAC ) 10 MG tablet Take 1 tablet (10 mg total) by mouth daily. 10/04/22  Yes Levora Reyes SAUNDERS, MD  hydrochlorothiazide  (MICROZIDE ) 12.5 MG capsule TAKE 1 CAPSULE BY MOUTH EVERY DAY 09/18/23  Yes Levora Reyes SAUNDERS, MD  levothyroxine  (SYNTHROID ) 125 MCG tablet TAKE 1 TABLET BY MOUTH EVERY DAY 02/24/24  Yes Levora Reyes SAUNDERS, MD  omeprazole  (PRILOSEC) 40 MG capsule TAKE 1 CAPSULE (40 MG TOTAL) BY MOUTH DAILY. 12/16/23  Yes Levora Reyes SAUNDERS, MD  Acetaminophen  (TYLENOL  8 HOUR PO) Take 1,000 mg by mouth once. Patient not taking: Reported on 06/29/2024    [provider]  amoxicillin -clavulanate (AUGMENTIN ) 875-125 MG tablet Take 1 tablet by mouth 2 (two) times daily. Patient not taking: Reported on 06/29/2024 04/04/23   Levora Reyes SAUNDERS, MD  chlorhexidine  (PERIDEX ) 0.12 % solution Use as directed 15  mLs in the mouth or throat 2 (two) times daily. Swish, spit. Patient not taking: Reported on 06/29/2024 04/05/23   Levora Reyes SAUNDERS, MD  FLUoxetine  (PROZAC ) 10 MG capsule TAKE 1 CAPSULE BY MOUTH EVERY DAY Patient not taking: Reported on 06/29/2024 06/10/23   Levora Reyes SAUNDERS, MD   Social History   Socioeconomic History   Marital status: Widowed    Spouse name: Not on file   Number of children: 2   Years of education: Not on file   Highest education level: Not on file  Occupational History   Occupation: Runner, Broadcasting/film/video   Occupation: surveyor, minerals  Tobacco Use   Smoking status: Former    Current packs/day: 0.00    Types: Cigarettes    Quit date: 04/25/1985    Years since quitting: 39.2   Smokeless tobacco: Never  Vaping Use   Vaping status: Never Used  Substance and Sexual Activity   Alcohol use: No   Drug use: No   Sexual activity:  Never  Other Topics Concern   Not on file  Social History Narrative   ** Merged History Encounter **       Social Drivers of Corporate Investment Banker Strain: Not on file  Food Insecurity: Not on file  Transportation Needs: Not on file  Physical Activity: Not on file  Stress: Not on file  Social Connections: Not on file  Intimate Partner Violence: Not on file    Observations/Objective: There were no vitals filed for this visit. Nontoxic appearance on video.  Euthymic mood.  Appropriate responses, does not appear to be responding to internal stimuli.  Normal respiratory effort, no distress.  All questions answered with understanding of plan expressed.  Assessment and Plan: Long-term current use of proton pump inhibitor therapy - Plan: omeprazole  (PRILOSEC) 40 MG capsule, B12, Magnesium, CANCELED: B12, CANCELED: Magnesium  - Reflux stable with use of PPI.  Check B12, magnesium with chronic use.  Continue same dose of Prilosec.  Hypothyroidism, unspecified type - Plan: levothyroxine  (SYNTHROID ) 125 MCG tablet, TSH, CANCELED: TSH  - Overall  stable symptoms.  Warm nature at night at times, check TSH and adjust plan accordingly.  Refilled same dose Synthroid  for now.  In person eval in 1 month.  Anxiety attack - Plan: ALPRAZolam  (XANAX ) 0.5 MG tablet, FLUoxetine  (PROZAC ) 10 MG tablet Panic attacks - Plan: FLUoxetine  (PROZAC ) 10 MG tablet  - Some increase stressors, increased anxiety score as above.  She will try increasing her fluoxetine  to 20 mg/day with the doubling on her 10 mg dose.  If that is tolerated can write for the 20 mg at her follow-up in 1 month.  RTC precautions.  Potential side effects discussed.  Essential hypertension - Plan: hydrochlorothiazide  (MICROZIDE ) 12.5 MG capsule  - Tolerating current med regimen.  In person eval with assessment of BP in the next 1 month but recommended home monitoring in the interim.  RTC precautions.  Prediabetes - Plan: Comprehensive metabolic panel with GFR, Hemoglobin A1c, Lipid panel, CANCELED: Hemoglobin A1c, CANCELED: Comprehensive metabolic panel with GFR, CANCELED: Lipid panel  - Check labs.  Commended on activity/exercise.  Some room for improvement with fast food intake.  Hyperlipidemia, unspecified hyperlipidemia type - Plan: Lipid panel, CANCELED: Lipid panel  - Check labs and adjust plan accordingly, in person visit 1 month.  Follow Up Instructions: Patient Instructions  Good talking with you today.  No med changes at this time other than fluoxetine  which you can take 2/day and we will follow-up in a month and see how that is working.  If you have any questions in the meantime let me know.  Please have labs at the [facility] location below within the next 1 week.  I will let you know if I have any concerns and any changes needed prior to our follow-up in person in 1 month.  Meds were refilled.  If possible, check some home blood pressure readings and let me know those results or can bring those to your next visit if they are stable.  Take care!  [facility] or  xray: Walk in 8:30-4:30 during weekdays, no appointment needed [address]  [place], [place] [address]   1 month in person recheck. I discussed the assessment and treatment plan with the patient. The patient was provided an opportunity to ask questions and all were answered. The patient agreed with the plan and demonstrated an understanding of the instructions.   The patient was advised to call back or seek an  in-person evaluation if the symptoms worsen or if the condition fails to improve as anticipated.   Reyes JONELLE Pines, MD

## 2024-06-29 NOTE — Progress Notes (Signed)
 Jackolyn called patient, LVM to return call

## 2024-06-30 NOTE — Progress Notes (Signed)
 Called patient to schedule 1 month follow up, no answer at the time of call LM to call back to schedule an appointment

## 2024-07-01 ENCOUNTER — Encounter: Payer: Self-pay | Admitting: Family Medicine

## 2024-07-01 NOTE — Progress Notes (Signed)
 Called again, no answer again, sending a letter

## 2024-07-13 ENCOUNTER — Telehealth: Payer: Self-pay

## 2024-07-13 NOTE — Telephone Encounter (Signed)
 Attempted to reach patient concerning colonoscopy recall; unable to speak with patient;  left message and number to the office for patient to call back and schedule appts;

## 2024-07-17 DIAGNOSIS — Z419 Encounter for procedure for purposes other than remedying health state, unspecified: Secondary | ICD-10-CM | POA: Diagnosis not present

## 2024-07-17 NOTE — Telephone Encounter (Signed)
 Attempted to reach patient concerning colonoscopy recall; unable to speak with patient;  left message and number to the office for patient to call back and schedule appts;

## 2024-07-29 NOTE — Telephone Encounter (Signed)
 Attempted to reach patient concerning colonoscopy recall; unable to speak with patient;  left message and number to the office for patient to call back and schedule appts;
